# Patient Record
Sex: Female | Born: 1950 | Race: White | Hispanic: No | Marital: Single | Smoking: Never smoker
Health system: Southern US, Community
[De-identification: ages and names within clinical notes are randomized; demographics above are authoritative.]

## PROBLEM LIST (undated history)

## (undated) ENCOUNTER — Ambulatory Visit: Source: Home / Self Care

## (undated) DIAGNOSIS — K219 Gastro-esophageal reflux disease without esophagitis: Secondary | ICD-10-CM

## (undated) DIAGNOSIS — F32A Depression, unspecified: Secondary | ICD-10-CM

## (undated) DIAGNOSIS — Z8719 Personal history of other diseases of the digestive system: Secondary | ICD-10-CM

## (undated) DIAGNOSIS — C801 Malignant (primary) neoplasm, unspecified: Secondary | ICD-10-CM

## (undated) DIAGNOSIS — F419 Anxiety disorder, unspecified: Secondary | ICD-10-CM

## (undated) DIAGNOSIS — M5136 Other intervertebral disc degeneration, lumbar region: Secondary | ICD-10-CM

## (undated) DIAGNOSIS — M51369 Other intervertebral disc degeneration, lumbar region without mention of lumbar back pain or lower extremity pain: Secondary | ICD-10-CM

## (undated) DIAGNOSIS — R630 Anorexia: Secondary | ICD-10-CM

## (undated) DIAGNOSIS — E785 Hyperlipidemia, unspecified: Secondary | ICD-10-CM

## (undated) DIAGNOSIS — M5430 Sciatica, unspecified side: Secondary | ICD-10-CM

## (undated) DIAGNOSIS — F329 Major depressive disorder, single episode, unspecified: Secondary | ICD-10-CM

## (undated) DIAGNOSIS — Z8711 Personal history of peptic ulcer disease: Secondary | ICD-10-CM

## (undated) DIAGNOSIS — N200 Calculus of kidney: Secondary | ICD-10-CM

## (undated) DIAGNOSIS — H9319 Tinnitus, unspecified ear: Secondary | ICD-10-CM

## (undated) HISTORY — PX: BACK SURGERY: SHX140

## (undated) HISTORY — DX: Sciatica, unspecified side: M54.30

## (undated) HISTORY — PX: CYST EXCISION: SHX5701

## (undated) HISTORY — PX: CYSTOSCOPY KIDNEY W/ URETERAL GUIDE WIRE: SUR371

## (undated) HISTORY — DX: Hyperlipidemia, unspecified: E78.5

## (undated) HISTORY — DX: Other intervertebral disc degeneration, lumbar region without mention of lumbar back pain or lower extremity pain: M51.369

## (undated) HISTORY — PX: BREAST BIOPSY: SHX20

## (undated) HISTORY — DX: Other intervertebral disc degeneration, lumbar region: M51.36

---
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
## 1974-12-03 HISTORY — PX: TONSILLECTOMY: SUR1361

## 1998-09-28 ENCOUNTER — Other Ambulatory Visit: Admission: RE | Admit: 1998-09-28 | Discharge: 1998-09-28 | Payer: Self-pay | Admitting: *Deleted

## 1999-02-02 ENCOUNTER — Ambulatory Visit (HOSPITAL_COMMUNITY): Admission: RE | Admit: 1999-02-02 | Discharge: 1999-02-02 | Payer: Self-pay | Admitting: Gastroenterology

## 1999-02-24 ENCOUNTER — Encounter: Payer: Self-pay | Admitting: Gastroenterology

## 1999-02-24 ENCOUNTER — Ambulatory Visit (HOSPITAL_COMMUNITY): Admission: RE | Admit: 1999-02-24 | Discharge: 1999-02-24 | Payer: Self-pay | Admitting: Gastroenterology

## 1999-09-20 ENCOUNTER — Other Ambulatory Visit: Admission: RE | Admit: 1999-09-20 | Discharge: 1999-09-20 | Payer: Self-pay | Admitting: *Deleted

## 2000-09-17 ENCOUNTER — Other Ambulatory Visit: Admission: RE | Admit: 2000-09-17 | Discharge: 2000-09-17 | Payer: Self-pay | Admitting: *Deleted

## 2001-12-03 HISTORY — PX: ROTATOR CUFF REPAIR: SHX139

## 2001-12-03 HISTORY — PX: ELBOW SURGERY: SHX618

## 2002-09-15 ENCOUNTER — Other Ambulatory Visit: Admission: RE | Admit: 2002-09-15 | Discharge: 2002-09-15 | Payer: Self-pay | Admitting: Obstetrics and Gynecology

## 2003-03-02 ENCOUNTER — Ambulatory Visit (HOSPITAL_BASED_OUTPATIENT_CLINIC_OR_DEPARTMENT_OTHER): Admission: RE | Admit: 2003-03-02 | Discharge: 2003-03-02 | Payer: Self-pay | Admitting: Orthopedic Surgery

## 2003-10-19 ENCOUNTER — Ambulatory Visit (HOSPITAL_COMMUNITY): Admission: RE | Admit: 2003-10-19 | Discharge: 2003-10-19 | Payer: Self-pay | Admitting: Gastroenterology

## 2003-11-16 ENCOUNTER — Encounter (INDEPENDENT_AMBULATORY_CARE_PROVIDER_SITE_OTHER): Payer: Self-pay | Admitting: Gastroenterology

## 2003-12-09 ENCOUNTER — Other Ambulatory Visit: Admission: RE | Admit: 2003-12-09 | Discharge: 2003-12-09 | Payer: Self-pay | Admitting: Obstetrics and Gynecology

## 2003-12-22 ENCOUNTER — Encounter: Admission: RE | Admit: 2003-12-22 | Discharge: 2003-12-22 | Payer: Self-pay | Admitting: Family Medicine

## 2004-01-17 ENCOUNTER — Ambulatory Visit (HOSPITAL_BASED_OUTPATIENT_CLINIC_OR_DEPARTMENT_OTHER): Admission: RE | Admit: 2004-01-17 | Discharge: 2004-01-17 | Payer: Self-pay | Admitting: Urology

## 2004-04-28 ENCOUNTER — Encounter: Admission: RE | Admit: 2004-04-28 | Discharge: 2004-04-28 | Payer: Self-pay | Admitting: Family Medicine

## 2004-12-14 ENCOUNTER — Other Ambulatory Visit: Admission: RE | Admit: 2004-12-14 | Discharge: 2004-12-14 | Payer: Self-pay | Admitting: Obstetrics and Gynecology

## 2005-02-02 ENCOUNTER — Ambulatory Visit (HOSPITAL_BASED_OUTPATIENT_CLINIC_OR_DEPARTMENT_OTHER): Admission: RE | Admit: 2005-02-02 | Discharge: 2005-02-02 | Payer: Self-pay | Admitting: Urology

## 2005-12-18 ENCOUNTER — Other Ambulatory Visit: Admission: RE | Admit: 2005-12-18 | Discharge: 2005-12-18 | Payer: Self-pay | Admitting: Obstetrics and Gynecology

## 2007-04-02 ENCOUNTER — Ambulatory Visit: Payer: Self-pay | Admitting: Gastroenterology

## 2007-11-10 ENCOUNTER — Encounter: Admission: RE | Admit: 2007-11-10 | Discharge: 2007-11-10 | Payer: Self-pay | Admitting: Endocrinology

## 2007-12-02 ENCOUNTER — Encounter (INDEPENDENT_AMBULATORY_CARE_PROVIDER_SITE_OTHER): Payer: Self-pay | Admitting: Interventional Radiology

## 2007-12-02 ENCOUNTER — Other Ambulatory Visit: Admission: RE | Admit: 2007-12-02 | Discharge: 2007-12-02 | Payer: Self-pay | Admitting: Interventional Radiology

## 2007-12-02 ENCOUNTER — Encounter: Admission: RE | Admit: 2007-12-02 | Discharge: 2007-12-02 | Payer: Self-pay | Admitting: Endocrinology

## 2009-02-28 ENCOUNTER — Telehealth: Payer: Self-pay | Admitting: Internal Medicine

## 2009-03-01 DIAGNOSIS — F341 Dysthymic disorder: Secondary | ICD-10-CM

## 2009-03-01 DIAGNOSIS — I10 Essential (primary) hypertension: Secondary | ICD-10-CM | POA: Insufficient documentation

## 2009-03-01 DIAGNOSIS — D126 Benign neoplasm of colon, unspecified: Secondary | ICD-10-CM

## 2009-03-01 DIAGNOSIS — K209 Esophagitis, unspecified without bleeding: Secondary | ICD-10-CM | POA: Insufficient documentation

## 2009-03-01 DIAGNOSIS — E119 Type 2 diabetes mellitus without complications: Secondary | ICD-10-CM

## 2009-03-01 DIAGNOSIS — E7800 Pure hypercholesterolemia, unspecified: Secondary | ICD-10-CM

## 2009-03-01 DIAGNOSIS — N200 Calculus of kidney: Secondary | ICD-10-CM

## 2009-03-01 DIAGNOSIS — K2940 Chronic atrophic gastritis without bleeding: Secondary | ICD-10-CM | POA: Insufficient documentation

## 2009-03-02 ENCOUNTER — Ambulatory Visit: Payer: Self-pay | Admitting: Gastroenterology

## 2009-03-02 ENCOUNTER — Telehealth: Payer: Self-pay | Admitting: Nurse Practitioner

## 2009-03-02 DIAGNOSIS — R1013 Epigastric pain: Secondary | ICD-10-CM

## 2009-03-02 DIAGNOSIS — R109 Unspecified abdominal pain: Secondary | ICD-10-CM

## 2009-03-02 DIAGNOSIS — R143 Flatulence: Secondary | ICD-10-CM

## 2009-03-02 DIAGNOSIS — R141 Gas pain: Secondary | ICD-10-CM

## 2009-03-02 DIAGNOSIS — R142 Eructation: Secondary | ICD-10-CM

## 2009-03-02 DIAGNOSIS — K5900 Constipation, unspecified: Secondary | ICD-10-CM | POA: Insufficient documentation

## 2009-03-21 ENCOUNTER — Ambulatory Visit: Payer: Self-pay | Admitting: Internal Medicine

## 2009-03-21 DIAGNOSIS — K589 Irritable bowel syndrome without diarrhea: Secondary | ICD-10-CM

## 2009-05-10 ENCOUNTER — Encounter (INDEPENDENT_AMBULATORY_CARE_PROVIDER_SITE_OTHER): Payer: Self-pay | Admitting: *Deleted

## 2009-11-18 ENCOUNTER — Encounter: Admission: RE | Admit: 2009-11-18 | Discharge: 2009-11-18 | Payer: Self-pay | Admitting: Family Medicine

## 2010-01-31 ENCOUNTER — Telehealth: Payer: Self-pay | Admitting: Gastroenterology

## 2010-04-10 ENCOUNTER — Encounter: Admission: RE | Admit: 2010-04-10 | Discharge: 2010-04-10 | Payer: Self-pay | Admitting: Family Medicine

## 2010-12-03 HISTORY — PX: EYE SURGERY: SHX253

## 2011-01-02 NOTE — Progress Notes (Signed)
Summary: REV Needed  Phone Note Outgoing Call Call back at [facility] Phone [phone]   Call placed by: Harlow Mares CMA Duncan Dull),  January 31, 2010 11:42 AM Call placed to: Patient Summary of Call: Left message on patients machine to call back. patient needs an office visit with Dr. Arlyce Dice per Ian Bushman office note from April.  Initial call taken by: Harlow Mares CMA Duncan Dull),  January 31, 2010 11:43 AM  Follow-up for Phone Call        Left message on patients machine to call back.  Follow-up by: Harlow Mares CMA Duncan Dull),  February 09, 2010 11:59 AM

## 2011-04-20 NOTE — Op Note (Signed)
[name], [name]                  ACCOUNT NO.:  [id number]   MEDICAL RECORD NO.:  [id number]          PATIENT TYPE:  AMB   LOCATION:  [facility]                         FACILITY:  [facility]   PHYSICIAN:  Mark C. Vernie Ammons, M.D.  DATE OF BIRTH:  August 10, 1951   DATE OF PROCEDURE:  02/02/2005  DATE OF DISCHARGE:                                 OPERATIVE REPORT   PREOPERATIVE DIAGNOSIS:  Right ureteral calculus.   POSTOPERATIVE DIAGNOSIS:  Right ureteral calculus.   PROCEDURE:  Cystoscopy, right retrograde pyelogram with interpretation of  right ureteroscopic stone extraction and double-J stent placement.   SURGEON:  Mark C. Vernie Ammons, M.D.   ANESTHESIA:  General.   SPECIMENS:  Stone.   DRAINS:  A 6 French 26 cm double-J stent in the right ureter with string.   BLOOD LOSS:  None.   COMPLICATIONS:  None.   INDICATIONS:  Patient is a 60 year old white female who was seen yesterday  with right flank pain.  A CT scan revealed a 3 mm stone in the distal  ureter.  She returned today with continued severe pain.  Had been taking  Tylox around the clock.  A KUB revealed a small stone in the distal right  ureter.  I discussed options with her.  She has elected to proceed with  surgical removal of the stone.  I went over the risks and complications as  well as the need for stent.  She understands and elected to proceed.   DESCRIPTION OF PROCEDURE:  After informed consent, the patient went to the  major OR and was placed on the table.  Administered general anesthesia and  was moved to the dorsal lithotomy position.  Her genitalia was thoroughly  prepped and draped.  A 6 French rigid ureteroscope was then passed into the  bladder.  The right orifice was identified.  The scope was passed into the  orifice without difficulty.  Once in the orifice, a retrograde pyelogram was  performed.  A retrograde pyelogram revealed the filling defect in the distal  ureter, consistent with a stone.  I then passed a  0.038 inch floppy-tipped  guidewire up the right ureter under direct fluoroscopic control beyond the  stone.  I then passed the scope over the guidewire to the level of the  stone, identified it, and removed the guidewire.  I then passed the nitinol  basket through the ureteroscope, grasped the stone and extracted it  completely.  The ureteroscope was then reinserted into the right ureter and  with the guidewire passed up into the kidney under direct fluoroscopic  control.  I then removed the ureteroscope and back-loaded the cystoscope,  passed a double-J stent over the guidewire into the renal pelvis with good  control being noted there and in the bladder.  The string was left affixed  to the stent, and the bladder was drained, the cystoscope removed, and the  patient was awakened and taken to the recovery room in stable, satisfactory  condition.  She tolerated the procedure well.  No intraoperative  complications.   She has Tylox already  and will use that for pain and then follow up in my  office in approximately five days for her stent removal.      MCO/MEDQ  D:  02/02/2005  T:  02/02/2005  Job:  [id number]

## 2011-04-20 NOTE — Op Note (Signed)
Alice [name], Alice [name]                              ACCOUNT NO.:  [id number]   MEDICAL RECORD NO.:  [id number]                   PATIENT TYPE:  AMB   LOCATION:  [facility]                                  FACILITY:  [facility]   PHYSICIAN:  Robert A. Thurston Hole, M.D.              DATE OF BIRTH:  1951/03/12   DATE OF PROCEDURE:  03/02/2003  DATE OF DISCHARGE:                                 OPERATIVE REPORT   PREOPERATIVE DIAGNOSES:  1. Left shoulder rotator cuff tendinitis with impingement.  2. Left elbow lateral epicondylitis with partial tear.   POSTOPERATIVE DIAGNOSES:  1. Left shoulder rotator cuff tendinitis with impingement.  2. Left elbow lateral epicondylitis with partial tear.   PROCEDURE:  1. Left shoulder examination under anesthesia followed by arthroscopic     subacromial decompression.  2. Left elbow lateral epicondylar release with partial lateral     epicondylectomy and repair.   SURGEON:  Elana Alm. Thurston Hole, M.D.   ASSISTANT:  Julien Girt, P.A.   ANESTHESIA:  General.   OPERATIVE TIME:  One hour.   COMPLICATIONS:  None.   INDICATION FOR PROCEDURE:  The patient is a 60 year old woman who has had  over a year of left shoulder and elbow pain, with exam and MRI documenting  rotator cuff tendinitis and impingement, with left elbow lateral  epicondylitis with partial tearing.  She has failed conservative care and is  now to undergo left shoulder arthroscopy and left elbow lateral epicondylar  release.   DESCRIPTION OF PROCEDURE:  The patient was brought to the operating room on  March 02, 2003 after an interscalen block had been placed in the holding  room by anesthesia.  She was placed on the operative table in a supine  position.  After an adequate level of general anesthesia was obtained, her  left shoulder and elbow were examined under anesthesia.  She had full range  of motion and both the shoulder and elbow were stable to ligamentous exam.  She was then placed in a  beach chair position and the shoulder and arm were  prepped using sterile Duraprep and draped using sterile technique.  Initially, the shoulder arthroscopy was performed.  Through a posterior  arthroscopic portal, the arthroscope with a pump attachment was placed and  through an anterior portal, an arthroscopic probe was placed.  On initial  inspection, the articular cartilage and the glenohumeral joint were intact.  The anterior labrum showed partial tearing of 25%, which was debrided;  superior labrum, partial fraying and tearing to 15-20%, which was debrided.  The biceps tendon anchor and biceps tendon were intact.  The posterior  labrum was intact.  The inferior labrum and anteroinferior glenohumeral  ligament complex were intact.  The rotator cuff was thoroughly inspected and  there was found to be no evidence of a tear.  Inferior capsule recess was  free of pathology.  After  this was done, the lateral arthroscopic portal was  made and the subacromial space was entered.  Moderately thickened bursitis  was resected.  The rotator cuff underneath this was moderately frayed, but  no evidence of a tear.  Subacromial decompression was carried out, removing  6 mm of the undersurface of the anterior, anterolateral and anteromedial  acromion and CA ligament release carried out as well.  The [facility] joint was not  disturbed.  After this was done, the shoulder could be brought through a  full range of motion with no impingement on the rotator cuff.  At this  point, the arthroscopic instruments were removed.  Portals were closed with  3-0 nylon sutures.  After this was done, attention was turned to the left  elbow.  A sterile tourniquet was placed and the arm was exsanguinated and  tourniquet elevated to 275 mm.  Initially, through a 3-cm lateral incision  based over the lateral epicondyle, exposure was made.  Underlying  subcutaneous tissues were incised in line with the skin incision.  The  fascia  over the lateral epicondyle was incised and the underlying ECRB and  ECRL tendons were exposed.  Careful and sharp dissection was used to strip  the ECRB and ECRL tendons off the lateral epicondyle, however, the radial  head capitellar joint was not entered.  The tendons were found to have  moderate degeneration with partial tearing and this was thoroughly debrided  back to healthy-appearing tissue.  After this was done, a partial lateral  epicondylectomy was carried out and then multiple drill holes were placed in  the lateral epicondyle and then using 2-0 Fibrewire suture, the ECRB and  ECRL tendons were reattached to the lateral epicondyle through two drill  holes in the lateral epicondyle and tied down over bone.  After this was  done, there was found to be a satisfactory repair.  At this point, the wound  was irrigated and then the fascia closed with 2-0 Vicryl.  Subcutaneous  tissues were closed with 3-0 Vicryl, subcuticular layer closed with 3-0  Prolene, and Steri-Strips were applied, sterile dressings and a long arm  splint applied, tourniquet was released, patient then awakened and taken to  the recovery room in stable condition.  Needle and sponge count was correct  x2 at the end of the case.   FOLLOWUP CARE:  The patient will be followed as an outpatient on Percocet  and Celebrex.  I will see her back in the office in a week for sutures out  and followup.                                               Robert A. Thurston Hole, M.D.    RAW/MEDQ  D:  03/02/2003  T:  03/02/2003  Job:  [id number]

## 2011-04-20 NOTE — Assessment & Plan Note (Signed)
[facility]                         GASTROENTEROLOGY OFFICE NOTE   Alice [name], Alice [name]                         MRN:          [id number]  DATE:04/02/2007                            DOB:          14-Oct-1951    Diane comes in and says she is doing fairly well.  Still has burning in  her stomach.  She sees Dr. Leonides Sake.  Says she is having some left  and right lower quadrant pain and bloating.  She went to [employer]  and lost some weight.  She has some fissures in the past, but has been  doing well.  Has symptoms now of alkaline gastritis.   FAMILY HISTORY:  Reveals her mother had colon polyps.   PAST MEDICAL HISTORY:  1. Hypercholesterolemia.  2. Anxiety and depression.  3. Renal stones.  4. Status post appendectomy.   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   REVIEW OF SYSTEMS:  Basically noncontributory, except for some blood in  her urine, increased urination and some night sweats.   PHYSICAL EXAMINATION:  GENERAL:  She looks great. Looks as she always  does.  VITAL SIGNS:  Height 5 feet, weight 129, blood pressure 110/68, pulse 68  and regular.  Neck, heart, lungs are unremarkable.   IMPRESSION:  1. Irritable bowel with constipation complaint.  2. Symptoms of alkaline gastritis.  3. Anxiety and depression.  4. Status post appendectomy.  5. Hyperlipidemia.  6. History of renal stones.   RECOMMENDATIONS:  Take Carafate suspension one t.i.d. between meals.  Senokot with senna for constipation and prunes.  I have put her on some  Zegerid.  I told her that she did not need her colonoscopy until next  year, and she is being referred to Dr. Iva Boop.   She has agreed with this.     Ulyess Mort, MD  Electronically Signed    SML/MedQ  DD: 04/02/2007  DT: 04/03/2007  Job #: [id number]   cc:   Holley Bouche, M.D.

## 2011-04-20 NOTE — Op Note (Signed)
NAME:  Alice [name], Alice [name]                            ACCOUNT NO.:  [id number]   MEDICAL RECORD NO.:  [id number]                   PATIENT TYPE:  AMB   LOCATION:  [facility]                                 FACILITY:  [facility]   PHYSICIAN:  Mark C. Vernie Ammons, M.D.               DATE OF BIRTH:  04-25-1951   DATE OF PROCEDURE:  01/17/2004  DATE OF DISCHARGE:                                 OPERATIVE REPORT   PREOPERATIVE DIAGNOSES:  Bilateral renal calculi and back pain.   POSTOPERATIVE DIAGNOSES:  Bilateral renal calculi and back pain.   PROCEDURE:  Cystoscopy, bilateral retrograde pyelograms with interpretation  and examination under anesthesia.   SURGEON:  Mark C. Vernie Ammons, M.D.   ANESTHESIA:  General.   BLOOD LOSS:  None.   DRAINS:  None.   SPECIMENS:  None.   COMPLICATIONS:  None.   INDICATIONS:  The patient is a 60 year old white female who was seen for  bilateral renal calculi and hematuria.  The stones were found on recent  workup for abdominal pain on a CT scan, they appeared to be peripherally  located.  She began having discomfort in the suprapubic region and pelvic  region back in mid October constant aching-type that then moved into the  left groin region.  She has never passed a stone and has not seen any  hematuria.  I cultured the urine to be sure she had no infection and that  was negative.  Because of that, I felt cystoscopy and retrogrades were  indicated to rule out ureteral pathology and evaluate the bladder  completely.  She, therefore, was brought to the OR today for cystoscopy and  retrograde pyelograms.   DESCRIPTION OF OPERATION:  After informed consent, the patient brought to  the major OR, placed on the table, administered general anesthesia and moved  to the dorsal lithotomy position.  Her genitalia were sterilely prepped and  draped and a 23 French cystoscope was then introduced into the bladder with  the obturator, the obturator was removed and the bladder  drained.  I  inserted a 12 degree lens to fully inspect the bladder and it was noted to  be lined with normal-appearing pink mucosa and free of any tumor, stones or  inflammatory lesions.  The ureteral orifices were normal in configuration  and position with clear efflux bilaterally.   An open-ended 6 French ureteral catheter was then placed into the left  ureteral orifice and a retrograde pyelogram was performed.  She was noted to  have an entirely normal ureter with normal caliber, no dilatation, no  intraluminal filling defects or external compression, no hydronephrosis and  a normal-appearing renal pelvis and caliceal system with sharp calices.  No  filling defects were noted.  I then watched the contrast pass down the  ureter fluoroscopically unobstructed and into the bladder.  A right  retrograde pyelogram was then performed in an  identical fashion with  identical findings, primarily no hydronephrosis, filling defects, mass  effect, stones or other abnormality.  I, therefore, drained the bladder and  removed the cystoscope.   With the patient under anesthesia, bimanual examination was performed.  She  was noted to have a normal uterus and cervix to palpation, no adnexal masses  or other pelvis masses palpable.  Rectal examination was performed and no  masses were felt or other abnormality.   At this point, she does have bilateral renal calculi which are likely  causing microscopic hematuria and will need followup of these to assure  their stability; however, it is my opinion that her pain is nonurologic in  origin.  She has already had colonoscopy and endoscopy and has seen a  gynecologist, her pain may be musculoskeletal in origin but does not appear  to be urologically related.  She will, therefore, follow up with me for her  kidney stones and 6 months for a KUB.                                               Mark C. Vernie Ammons, M.D.    MCO/MEDQ  D:  01/17/2004  T:  01/17/2004   Job:  [id number]   cc:   Holley Bouche, M.D.  [address]  [place], [place] [address]  Fax: [phone]

## 2011-10-02 ENCOUNTER — Other Ambulatory Visit: Payer: Self-pay | Admitting: Ophthalmology

## 2011-11-21 ENCOUNTER — Encounter: Payer: Self-pay | Admitting: Gastroenterology

## 2012-05-16 ENCOUNTER — Other Ambulatory Visit: Payer: Self-pay | Admitting: Family Medicine

## 2012-05-16 DIAGNOSIS — R1030 Lower abdominal pain, unspecified: Secondary | ICD-10-CM

## 2012-05-23 ENCOUNTER — Ambulatory Visit
Admission: RE | Admit: 2012-05-23 | Discharge: 2012-05-23 | Disposition: A | Discharge status: Home / Self Care | Payer: BC Managed Care – PPO | Source: Ambulatory Visit | Attending: Family Medicine | Admitting: Family Medicine

## 2012-05-23 DIAGNOSIS — R1030 Lower abdominal pain, unspecified: Secondary | ICD-10-CM

## 2012-05-28 ENCOUNTER — Other Ambulatory Visit: Payer: Self-pay | Admitting: Family Medicine

## 2012-05-28 DIAGNOSIS — K869 Disease of pancreas, unspecified: Secondary | ICD-10-CM

## 2012-05-28 DIAGNOSIS — N949 Unspecified condition associated with female genital organs and menstrual cycle: Secondary | ICD-10-CM

## 2012-06-03 ENCOUNTER — Other Ambulatory Visit: Payer: BC Managed Care – PPO

## 2012-06-03 ENCOUNTER — Ambulatory Visit
Admission: RE | Admit: 2012-06-03 | Discharge: 2012-06-03 | Disposition: A | Discharge status: Home / Self Care | Payer: BC Managed Care – PPO | Source: Ambulatory Visit | Attending: Family Medicine | Admitting: Family Medicine

## 2012-06-03 DIAGNOSIS — N949 Unspecified condition associated with female genital organs and menstrual cycle: Secondary | ICD-10-CM

## 2012-06-03 DIAGNOSIS — K869 Disease of pancreas, unspecified: Secondary | ICD-10-CM

## 2012-06-03 MED ORDER — GADOBENATE DIMEGLUMINE 529 MG/ML IV SOLN
12.0000 mL | Freq: Once | INTRAVENOUS | Status: AC | PRN
  Administered 2012-06-03: 12 mL via INTRAVENOUS

## 2012-06-07 ENCOUNTER — Ambulatory Visit
Admission: RE | Admit: 2012-06-07 | Discharge: 2012-06-07 | Disposition: A | Discharge status: Home / Self Care | Payer: BC Managed Care – PPO | Source: Ambulatory Visit | Attending: Family Medicine | Admitting: Family Medicine

## 2012-06-07 DIAGNOSIS — K869 Disease of pancreas, unspecified: Secondary | ICD-10-CM

## 2012-06-07 DIAGNOSIS — N949 Unspecified condition associated with female genital organs and menstrual cycle: Secondary | ICD-10-CM

## 2012-06-07 MED ORDER — GADOBENATE DIMEGLUMINE 529 MG/ML IV SOLN
14.0000 mL | Freq: Once | INTRAVENOUS | Status: AC | PRN
  Administered 2012-06-07: 14 mL via INTRAVENOUS

## 2012-07-22 ENCOUNTER — Other Ambulatory Visit: Payer: Self-pay | Admitting: Radiology

## 2012-08-11 ENCOUNTER — Encounter (INDEPENDENT_AMBULATORY_CARE_PROVIDER_SITE_OTHER): Payer: Self-pay | Admitting: Surgery

## 2012-08-11 ENCOUNTER — Ambulatory Visit (INDEPENDENT_AMBULATORY_CARE_PROVIDER_SITE_OTHER): Payer: BC Managed Care – PPO | Admitting: Surgery

## 2012-08-11 VITALS — BP 154/82 | HR 67 | Temp 97.4°F | Resp 18 | Ht 65.0 in | Wt 139.4 lb

## 2012-08-11 DIAGNOSIS — N6099 Unspecified benign mammary dysplasia of unspecified breast: Secondary | ICD-10-CM

## 2012-08-11 DIAGNOSIS — N62 Hypertrophy of breast: Secondary | ICD-10-CM

## 2012-08-11 NOTE — Progress Notes (Signed)
Patient ID: Alice [name], female   DOB: 11/06/1951, 61 y.o.   MRN: [id number]  Chief Complaint  Patient presents with  . Pre-op Exam    eval Rt br    HPI Alice [name] is a 61 y.o. female.   HPIshe is referred by Dr. Johny Blamer and Dr. Yolanda Bonine for evaluation of atypical cells in the right breast found on recent mammography and biopsy.  She has had no previous problems regarding her breast. She denies nipple discharge. She has no complaints.  Past Medical History  Diagnosis Date  . Hyperlipidemia     Past Surgical History  Procedure Date  . Appendectomy 1959  . Tonsillectomy 1976  . Shoulder surgery 2003    left  . Elbow surgery 2003    left  . Eye surgery 2012    right    Family History  Problem Relation Age of Onset  . Cancer Mother     breast    Social History History  Substance Use Topics  . Smoking status: Never Smoker   . Smokeless tobacco: Not on file  . Alcohol Use: No    Allergies  Allergen Reactions  . Penicillins     Upset stomach  . Propoxyphene Hcl     Current Outpatient Prescriptions  Medication Sig Dispense Refill  . chlordiazePOXIDE (LIBRIUM) 10 MG capsule Take 10 mg by mouth 3 (three) times daily as needed.      . citalopram (CELEXA) 10 MG tablet Take 10 mg by mouth daily.      . clonazePAM (KLONOPIN) 0.5 MG tablet Take 0.5 mg by mouth at bedtime.      Marland Kitchen estradiol-norethindrone (ACTIVELLA) 1-0.5 MG per tablet Take 1 tablet by mouth daily.      . pravastatin (PRAVACHOL) 10 MG tablet Take 10 mg by mouth daily.        Review of Systems Review of Systems  Constitutional: Negative for fever, chills and unexpected weight change.  HENT: Negative for hearing loss, congestion, sore throat, trouble swallowing and voice change.   Eyes: Negative for visual disturbance.  Respiratory: Negative for cough and wheezing.   Cardiovascular: Negative for chest pain, palpitations and leg swelling.  Gastrointestinal: Negative for nausea, vomiting,  abdominal pain, diarrhea, constipation, blood in stool, abdominal distention and anal bleeding.  Genitourinary: Negative for hematuria, vaginal bleeding and difficulty urinating.  Musculoskeletal: Negative for arthralgias.  Skin: Negative for rash and wound.  Neurological: Negative for seizures, syncope and headaches.  Hematological: Negative for adenopathy. Does not bruise/bleed easily.  Psychiatric/Behavioral: Negative for confusion.    Blood pressure 154/82, pulse 67, temperature 97.4 F (36.3 C), temperature source Temporal, resp. rate 18, height 5\' 5"  (1.651 m), weight 139 lb 6.4 oz (63.231 kg), SpO2 98.00%.  Physical Exam Physical Exam  Constitutional: She is oriented to person, place, and time. She appears well-developed and well-nourished. No distress.  HENT:  Head: Normocephalic and atraumatic.  Right Ear: External ear normal.  Left Ear: External ear normal.  Nose: Nose normal.  Mouth/Throat: Oropharynx is clear and moist.  Eyes: Conjunctivae are normal. Pupils are equal, round, and reactive to light. Right eye exhibits no discharge. Left eye exhibits no discharge. No scleral icterus.  Neck: Normal range of motion. Neck supple. No tracheal deviation present. No thyromegaly present.  Cardiovascular: Normal rate, regular rhythm, normal heart sounds and intact distal pulses.   No murmur heard. Pulmonary/Chest: Effort normal and breath sounds normal. No respiratory distress. She has no wheezes. She has no  rales.  Abdominal: Soft. Bowel sounds are normal. She exhibits no distension.  Musculoskeletal: Normal range of motion. She exhibits no edema and no tenderness.  Lymphadenopathy:    She has no cervical adenopathy.  Neurological: She is alert and oriented to person, place, and time.  Skin: Skin is warm and dry. No rash noted. She is not diaphoretic. No erythema.  Psychiatric: Her behavior is normal. Judgment and thought content normal.    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 I discussed this with her in detail. I discussed the risk of surgery which includes but is not limited to bleeding, infection, injury to shrink structures, need for further surgery should malignancy be present, et Karie Soda. She understands and wishes to proceed. Surgery will be scheduled. Likelihood of success is good       Kacey Dysert A 08/11/2012, 1:48 PM

## 2012-08-20 ENCOUNTER — Telehealth: Payer: Self-pay | Admitting: Gastroenterology

## 2012-08-20 NOTE — Telephone Encounter (Signed)
Left message for pt to call back  °

## 2012-08-22 NOTE — Telephone Encounter (Signed)
Pt c/o abdominal pain and bloating. Pt requesting to be seen by Dr. Arlyce Dice. Pt scheduled to see Dr. Arlyce Dice 09/22/12. Pt aware of appt date and time.

## 2012-09-01 ENCOUNTER — Encounter (HOSPITAL_BASED_OUTPATIENT_CLINIC_OR_DEPARTMENT_OTHER): Payer: Self-pay | Admitting: *Deleted

## 2012-09-01 NOTE — Progress Notes (Signed)
No labs needed

## 2012-09-03 NOTE — H&P (Signed)
Patient ID: Alice [name], female DOB: [birth year], 61 y.o. MRN: [id number]  Chief Complaint   Patient presents with   .  Pre-op Exam     eval Rt br    HPI  Alice [name] is a 61 y.o. female.  HPIshe is referred by Dr. Johny Blamer and Dr. Yolanda Bonine for evaluation of atypical cells in the right breast found on recent mammography and biopsy. She has had no previous problems regarding her breast. She denies nipple discharge. She has no complaints.  Past Medical History   Diagnosis  Date   .  Hyperlipidemia     Past Surgical History   Procedure  Date   .  Appendectomy  1959   .  Tonsillectomy  1976   .  Shoulder surgery  2003     left   .  Elbow surgery  2003     left   .  Eye surgery  2012     right    Family History   Problem  Relation  Age of Onset   .  Cancer  Mother       breast    Social History  History   Substance Use Topics   .  Smoking status:  Never Smoker   .  Smokeless tobacco:  Not on file   .  Alcohol Use:  No    Allergies   Allergen  Reactions   .  Penicillins      Upset stomach   .  Propoxyphene Hcl     Current Outpatient Prescriptions   Medication  Sig  Dispense  Refill   .  chlordiazePOXIDE (LIBRIUM) 10 MG capsule  Take 10 mg by mouth 3 (three) times daily as needed.     .  citalopram (CELEXA) 10 MG tablet  Take 10 mg by mouth daily.     .  clonazePAM (KLONOPIN) 0.5 MG tablet  Take 0.5 mg by mouth at bedtime.     Marland Kitchen  estradiol-norethindrone (ACTIVELLA) 1-0.5 MG per tablet  Take 1 tablet by mouth daily.     .  pravastatin (PRAVACHOL) 10 MG tablet  Take 10 mg by mouth daily.      Review of Systems  Review of Systems  Constitutional: Negative for fever, chills and unexpected weight change.  HENT: Negative for hearing loss, congestion, sore throat, trouble swallowing and voice change.  Eyes: Negative for visual disturbance.  Respiratory: Negative for cough and wheezing.  Cardiovascular: Negative for chest pain, palpitations and leg swelling.    Gastrointestinal: Negative for nausea, vomiting, abdominal pain, diarrhea, constipation, blood in stool, abdominal distention and anal bleeding.  Genitourinary: Negative for hematuria, vaginal bleeding and difficulty urinating.  Musculoskeletal: Negative for arthralgias.  Skin: Negative for rash and wound.  Neurological: Negative for seizures, syncope and headaches.  Hematological: Negative for adenopathy. Does not bruise/bleed easily.  Psychiatric/Behavioral: Negative for confusion.   Blood pressure 154/82, pulse 67, temperature 97.4 F (36.3 C), temperature source Temporal, resp. rate 18, height 5\' 5"  (1.651 m), weight 139 lb 6.4 oz (63.231 kg), SpO2 98.00%.  Physical Exam  Physical Exam  Constitutional: She is oriented to person, place, and time. She appears well-developed and well-nourished. No distress.  HENT:  Head: Normocephalic and atraumatic.  Right Ear: External ear normal.  Left Ear: External ear normal.  Nose: Nose normal.  Mouth/Throat: Oropharynx is clear and moist.  Eyes: Conjunctivae are normal. Pupils are equal, round, and reactive to light. Right eye exhibits no discharge. Left  eye exhibits no discharge. No scleral icterus.  Neck: Normal range of motion. Neck supple. No tracheal deviation present. No thyromegaly present.  Cardiovascular: Normal rate, regular rhythm, normal heart sounds and intact distal pulses.  No murmur heard.  Pulmonary/Chest: Effort normal and breath sounds normal. No respiratory distress. She has no wheezes. She has no rales.  Abdominal: Soft. Bowel sounds are normal. She exhibits no distension.  Musculoskeletal: Normal range of motion. She exhibits no edema and no tenderness.  Lymphadenopathy:  She has no cervical adenopathy.  Neurological: She is alert and oriented to person, place, and time.  Skin: Skin is warm and dry. No rash noted. She is not diaphoretic. No erythema.  Psychiatric: Her behavior is normal. Judgment and thought content  normal.   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 I discussed this with her in detail. I discussed the risk of surgery which includes but is not limited to bleeding, infection, injury to shrink structures, need for further surgery should malignancy be present, et Karie Soda. She understands and wishes to proceed. Surgery will be scheduled. Likelihood of success is good   Tijuana Scheidegger A

## 2012-09-04 ENCOUNTER — Ambulatory Visit (HOSPITAL_BASED_OUTPATIENT_CLINIC_OR_DEPARTMENT_OTHER)
Admission: RE | Admit: 2012-09-04 | Discharge: 2012-09-04 | Disposition: A | Discharge status: Home / Self Care | Payer: BC Managed Care – PPO | Source: Ambulatory Visit | Attending: Surgery | Admitting: Surgery

## 2012-09-04 ENCOUNTER — Encounter (HOSPITAL_BASED_OUTPATIENT_CLINIC_OR_DEPARTMENT_OTHER)
Admission: RE | Disposition: A | Discharge status: Home / Self Care | Payer: Self-pay | Source: Ambulatory Visit | Attending: Surgery

## 2012-09-04 ENCOUNTER — Encounter (HOSPITAL_BASED_OUTPATIENT_CLINIC_OR_DEPARTMENT_OTHER): Payer: Self-pay | Admitting: *Deleted

## 2012-09-04 ENCOUNTER — Encounter (HOSPITAL_BASED_OUTPATIENT_CLINIC_OR_DEPARTMENT_OTHER): Payer: Self-pay | Admitting: Anesthesiology

## 2012-09-04 ENCOUNTER — Ambulatory Visit (HOSPITAL_BASED_OUTPATIENT_CLINIC_OR_DEPARTMENT_OTHER): Payer: BC Managed Care – PPO | Admitting: Anesthesiology

## 2012-09-04 DIAGNOSIS — N6019 Diffuse cystic mastopathy of unspecified breast: Secondary | ICD-10-CM

## 2012-09-04 DIAGNOSIS — N6089 Other benign mammary dysplasias of unspecified breast: Secondary | ICD-10-CM

## 2012-09-04 DIAGNOSIS — N6099 Unspecified benign mammary dysplasia of unspecified breast: Secondary | ICD-10-CM

## 2012-09-04 HISTORY — DX: Personal history of other diseases of the digestive system: Z87.19

## 2012-09-04 HISTORY — DX: Major depressive disorder, single episode, unspecified: F32.9

## 2012-09-04 HISTORY — DX: Depression, unspecified: F32.A

## 2012-09-04 HISTORY — DX: Anxiety disorder, unspecified: F41.9

## 2012-09-04 LAB — POCT HEMOGLOBIN-HEMACUE: Hemoglobin: 14.6 g/dL (ref 12.0–15.0)

## 2012-09-04 SURGERY — BREAST LUMPECTOMY WITH NEEDLE LOCALIZATION
Anesthesia: General | Site: Breast | Laterality: Right | Wound class: Clean

## 2012-09-04 MED ORDER — MIDAZOLAM HCL 2 MG/2ML IJ SOLN: 0.5000 mg | INTRAMUSCULAR | Status: DC | PRN

## 2012-09-04 MED ORDER — LACTATED RINGERS IV SOLN
INTRAVENOUS | Status: DC
  Administered 2012-09-04: 14:00:00 via INTRAVENOUS

## 2012-09-04 MED ORDER — ONDANSETRON HCL 4 MG/2ML IJ SOLN: 4.0000 mg | Freq: Four times a day (QID) | INTRAMUSCULAR | Status: DC | PRN

## 2012-09-04 MED ORDER — OXYCODONE HCL 5 MG/5ML PO SOLN: 5.0000 mg | Freq: Once | ORAL | Status: DC | PRN

## 2012-09-04 MED ORDER — ONDANSETRON HCL 4 MG/2ML IJ SOLN
INTRAMUSCULAR | Status: DC | PRN
  Administered 2012-09-04: 4 mg via INTRAVENOUS

## 2012-09-04 MED ORDER — ACETAMINOPHEN 325 MG PO TABS: 650.0000 mg | ORAL_TABLET | ORAL | Status: DC | PRN

## 2012-09-04 MED ORDER — MIDAZOLAM HCL 5 MG/5ML IJ SOLN
INTRAMUSCULAR | Status: DC | PRN
  Administered 2012-09-04: 2 mg via INTRAVENOUS

## 2012-09-04 MED ORDER — DEXAMETHASONE SODIUM PHOSPHATE 4 MG/ML IJ SOLN
INTRAMUSCULAR | Status: DC | PRN
  Administered 2012-09-04: 10 mg via INTRAVENOUS

## 2012-09-04 MED ORDER — PROPOFOL 10 MG/ML IV BOLUS
INTRAVENOUS | Status: DC | PRN
  Administered 2012-09-04: 200 mg via INTRAVENOUS

## 2012-09-04 MED ORDER — SODIUM CHLORIDE 0.9 % IJ SOLN: 3.0000 mL | Freq: Two times a day (BID) | INTRAMUSCULAR | Status: DC

## 2012-09-04 MED ORDER — MORPHINE SULFATE 2 MG/ML IJ SOLN: 2.0000 mg | INTRAMUSCULAR | Status: DC | PRN

## 2012-09-04 MED ORDER — SODIUM CHLORIDE 0.9 % IJ SOLN: 3.0000 mL | INTRAMUSCULAR | Status: DC | PRN

## 2012-09-04 MED ORDER — HYDROMORPHONE HCL PF 1 MG/ML IJ SOLN: 0.2500 mg | INTRAMUSCULAR | Status: DC | PRN

## 2012-09-04 MED ORDER — ACETAMINOPHEN 10 MG/ML IV SOLN
1000.0000 mg | Freq: Once | INTRAVENOUS | Status: AC
  Administered 2012-09-04: 1000 mg via INTRAVENOUS

## 2012-09-04 MED ORDER — CIPROFLOXACIN IN D5W 400 MG/200ML IV SOLN
400.0000 mg | INTRAVENOUS | Status: AC
  Administered 2012-09-04: 400 mg via INTRAVENOUS

## 2012-09-04 MED ORDER — LIDOCAINE HCL (CARDIAC) 20 MG/ML IV SOLN
INTRAVENOUS | Status: DC | PRN
  Administered 2012-09-04: 60 mg via INTRAVENOUS

## 2012-09-04 MED ORDER — OXYCODONE HCL 5 MG PO TABS: 5.0000 mg | ORAL_TABLET | Freq: Once | ORAL | Status: DC | PRN

## 2012-09-04 MED ORDER — KETOROLAC TROMETHAMINE 30 MG/ML IJ SOLN
INTRAMUSCULAR | Status: DC | PRN
  Administered 2012-09-04: 30 mg via INTRAVENOUS

## 2012-09-04 MED ORDER — FENTANYL CITRATE 0.05 MG/ML IJ SOLN: 50.0000 ug | INTRAMUSCULAR | Status: DC | PRN

## 2012-09-04 MED ORDER — BUPIVACAINE-EPINEPHRINE 0.5% -1:200000 IJ SOLN
INTRAMUSCULAR | Status: DC | PRN
  Administered 2012-09-04: 16 mL

## 2012-09-04 MED ORDER — SODIUM CHLORIDE 0.9 % IV SOLN: 250.0000 mL | INTRAVENOUS | Status: DC | PRN

## 2012-09-04 MED ORDER — HYDROCODONE-ACETAMINOPHEN 5-325 MG PO TABS: 1.0000 | ORAL_TABLET | ORAL | Status: AC | PRN

## 2012-09-04 MED ORDER — FENTANYL CITRATE 0.05 MG/ML IJ SOLN: INTRAMUSCULAR | Status: DC | PRN

## 2012-09-04 MED ORDER — FENTANYL CITRATE 0.05 MG/ML IJ SOLN
INTRAMUSCULAR | Status: DC | PRN
  Administered 2012-09-04: 50 ug via INTRAVENOUS

## 2012-09-04 MED ORDER — OXYCODONE HCL 5 MG PO TABS: 5.0000 mg | ORAL_TABLET | ORAL | Status: DC | PRN

## 2012-09-04 MED ORDER — ACETAMINOPHEN 650 MG RE SUPP: 650.0000 mg | RECTAL | Status: DC | PRN

## 2012-09-04 SURGICAL SUPPLY — 43 items
APL SKNCLS STERI-STRIP NONHPOA (GAUZE/BANDAGES/DRESSINGS) ×1
BENZOIN TINCTURE PRP APPL 2/3 (GAUZE/BANDAGES/DRESSINGS) ×2 IMPLANT
BLADE HEX COATED 2.75 (ELECTRODE) ×2 IMPLANT
BLADE SURG 15 STRL LF DISP TIS (BLADE) ×1 IMPLANT
BLADE SURG 15 STRL SS (BLADE) ×2
CANISTER SUCTION 1200CC (MISCELLANEOUS) IMPLANT
CHLORAPREP W/TINT 26ML (MISCELLANEOUS) ×2 IMPLANT
CLIP TI WIDE RED SMALL 6 (CLIP) IMPLANT
CLOTH BEACON ORANGE TIMEOUT ST (SAFETY) ×2 IMPLANT
COVER MAYO STAND STRL (DRAPES) ×2 IMPLANT
COVER TABLE BACK 60X90 (DRAPES) ×2 IMPLANT
DECANTER SPIKE VIAL GLASS SM (MISCELLANEOUS) IMPLANT
DEVICE DUBIN W/COMP PLATE 8390 (MISCELLANEOUS) ×1 IMPLANT
DRAPE PED LAPAROTOMY (DRAPES) ×2 IMPLANT
DRAPE UTILITY XL STRL (DRAPES) ×2 IMPLANT
DRSG TEGADERM 4X4.75 (GAUZE/BANDAGES/DRESSINGS) ×2 IMPLANT
ELECT REM PT RETURN 9FT ADLT (ELECTROSURGICAL) ×2
ELECTRODE REM PT RTRN 9FT ADLT (ELECTROSURGICAL) ×1 IMPLANT
GAUZE SPONGE 4X4 12PLY STRL LF (GAUZE/BANDAGES/DRESSINGS) ×2 IMPLANT
GLOVE BIO SURGEON STRL SZ7 (GLOVE) ×1 IMPLANT
GLOVE INDICATOR 7.0 STRL GRN (GLOVE) ×1 IMPLANT
GLOVE SURG SIGNA 7.5 PF LTX (GLOVE) ×2 IMPLANT
GOWN PREVENTION PLUS XLARGE (GOWN DISPOSABLE) ×2 IMPLANT
GOWN PREVENTION PLUS XXLARGE (GOWN DISPOSABLE) ×2 IMPLANT
KIT MARKER MARGIN INK (KITS) ×1 IMPLANT
NDL HYPO 25X1 1.5 SAFETY (NEEDLE) ×1 IMPLANT
NEEDLE HYPO 25X1 1.5 SAFETY (NEEDLE) ×2 IMPLANT
NS IRRIG 1000ML POUR BTL (IV SOLUTION) ×1 IMPLANT
PACK BASIN DAY SURGERY FS (CUSTOM PROCEDURE TRAY) ×2 IMPLANT
PENCIL BUTTON HOLSTER BLD 10FT (ELECTRODE) ×2 IMPLANT
SLEEVE SCD COMPRESS KNEE MED (MISCELLANEOUS) ×1 IMPLANT
SPONGE LAP 4X18 X RAY DECT (DISPOSABLE) ×2 IMPLANT
STRIP CLOSURE SKIN 1/2X4 (GAUZE/BANDAGES/DRESSINGS) ×2 IMPLANT
SUT MNCRL AB 4-0 PS2 18 (SUTURE) ×2 IMPLANT
SUT SILK 2 0 SH (SUTURE) ×1 IMPLANT
SUT VIC AB 3-0 SH 27 (SUTURE) ×2
SUT VIC AB 3-0 SH 27X BRD (SUTURE) ×1 IMPLANT
SYR CONTROL 10ML LL (SYRINGE) ×2 IMPLANT
TOWEL OR 17X24 6PK STRL BLUE (TOWEL DISPOSABLE) ×2 IMPLANT
TOWEL OR NON WOVEN STRL DISP B (DISPOSABLE) ×2 IMPLANT
TUBE CONNECTING 20X1/4 (TUBING) IMPLANT
WATER STERILE IRR 1000ML POUR (IV SOLUTION) ×1 IMPLANT
YANKAUER SUCT BULB TIP NO VENT (SUCTIONS) IMPLANT

## 2012-09-04 NOTE — Interval H&P Note (Signed)
History and Physical Interval Note: no change in H and P  09/04/2012 1:10 PM  N Alice [name]  has presented today for surgery, with the diagnosis of atypical cells right breast  The various methods of treatment have been discussed with the patient and family. After consideration of risks, benefits and other options for treatment, the patient has consented to  Procedure(s) (LRB) with comments: BREAST LUMPECTOMY WITH NEEDLE LOCALIZATION (Right) - needle localized right breast lumpectomy as [name] surgical intervention .  The patient's history has been reviewed, patient examined, no change in status, stable for surgery.  I have reviewed the patient's chart and labs.  Questions were answered to the patient's satisfaction.     Alice [name]

## 2012-09-04 NOTE — Op Note (Signed)
BREAST LUMPECTOMY WITH NEEDLE LOCALIZATION  Procedure Note  N Anayi Bricco 09/04/2012   Pre-op Diagnosis: atypical cells right breast     Post-op Diagnosis: same  Procedure(s): NEEDLE LOCALIZED RIGHT BREAST LUMPECTOMY  Surgeon(s): Shelly Rubenstein, MD  Anesthesia: General  Staff:  Nolon Nations Ward, RN - Scrub Person Aleen Sells, RN - Circulator  Estimated Blood Loss: Minimal               Specimens: right breast sent to path  Indications: This is a 61 year old female with atypical cells in the right breast. These were confirmed on stereotactic biopsy. She now presents for needle localized lumpectomy.  Procedure: The patient presented to the holding area having had a localization wire placed in the right breast by the radiologist. She was then taken to the operating room and identified as the correct patient. She was placed on the operating room table and general anesthesia was induced. The skin around the lower edge of the right areola was anesthetized Marcaine. I made a circumareolar incision with electrocautery. I did discuss the breast tissue with the electrocautery. I was then able to identify localization wire and bring it into the incision. I then performed a wide lobectomy going down to the chest wall removing all the tissue around the localization wire. Wide adequate margins appear to be achieved. The specimen was completely removed and x-rayed. The suspicious area was confirmed to be in the center of the specimen with good margins. This was then sent to pathology for evaluation. Hemostasis was achieved with cautery.I I closed the subcuticular tissueWith interrupted 3-0 Vicryl sutures and closed the skin with a running 4-0 Monocryl. Steri-Strips, gauze, and Tegaderm were then applied. The patient tolerated the procedure well. All the counts were correct at the end of the procedure. The patient was then extubated in the operating room and taken in a stable condition to  the recovery room.          Lattie Riege A   Date: 09/04/2012  Time: 2:10 PM

## 2012-09-04 NOTE — Anesthesia Preprocedure Evaluation (Signed)
Anesthesia Evaluation  Patient identified by MRN, date of birth, ID band Patient awake    Reviewed: Allergy & Precautions, H&P , NPO status , Patient's Chart, lab work & pertinent test results  Airway Mallampati: I TM Distance: >3 FB Neck ROM: Full    Dental No notable dental hx. (+) Teeth Intact and Dental Advisory Given   Pulmonary neg pulmonary ROS,  breath sounds clear to auscultation  Pulmonary exam normal       Cardiovascular negative cardio ROS  Rhythm:Regular Rate:Normal     Neuro/Psych negative neurological ROS  negative psych ROS   GI/Hepatic negative GI ROS, Neg liver ROS,   Endo/Other  negative endocrine ROSneg diabetes  Renal/GU Renal diseasenegative Renal ROS  negative genitourinary   Musculoskeletal   Abdominal   Peds  Hematology negative hematology ROS (+)   Anesthesia Other Findings   Reproductive/Obstetrics negative OB ROS                           Anesthesia Physical Anesthesia Plan  ASA: II  Anesthesia Plan: General   Post-op Pain Management:    Induction: Intravenous  Airway Management Planned: LMA  Additional Equipment:   Intra-op Plan:   Post-operative Plan: Extubation in OR  Informed Consent: I have reviewed the patients History and Physical, chart, labs and discussed the procedure including the risks, benefits and alternatives for the proposed anesthesia with the patient or authorized representative who has indicated his/her understanding and acceptance.   Dental advisory given  Plan Discussed with: CRNA  Anesthesia Plan Comments:         Anesthesia Quick Evaluation

## 2012-09-04 NOTE — Anesthesia Procedure Notes (Signed)
Procedure Name: LMA Insertion Performed by: Jericha Bryden W Pre-anesthesia Checklist: Patient identified, Timeout performed, Emergency Drugs available, Suction available and Patient being monitored Patient Re-evaluated:Patient Re-evaluated prior to inductionOxygen Delivery Method: Circle system utilized Preoxygenation: Pre-oxygenation with 100% oxygen Intubation Type: IV induction Ventilation: Mask ventilation without difficulty LMA: LMA inserted LMA Size: 4.0 Number of attempts: 1 Placement Confirmation: breath sounds checked- equal and bilateral and positive ETCO2 Tube secured with: Tape Dental Injury: Teeth and Oropharynx as per pre-operative assessment      

## 2012-09-04 NOTE — Anesthesia Postprocedure Evaluation (Signed)
  Anesthesia Post-op Note  Patient: Alice [name]  Procedure(s) Performed: Procedure(s) (LRB) with comments: BREAST LUMPECTOMY WITH NEEDLE LOCALIZATION (Right) - needle localized right breast lumpectomy  Patient Location: PACU  Anesthesia Type: General  Level of Consciousness: awake, alert  and oriented  Airway and Oxygen Therapy: Patient Spontanous Breathing  Post-op Pain: none  Post-op Assessment: Post-op Vital signs reviewed, Patient's Cardiovascular Status Stable, Respiratory Function Stable, Patent Airway and No signs of Nausea or vomiting  Post-op Vital Signs: Reviewed and stable  Complications: No apparent anesthesia complications

## 2012-09-04 NOTE — Transfer of Care (Signed)
Immediate Anesthesia Transfer of Care Note  Patient: Alice [name]  Procedure(s) Performed: Procedure(s) (LRB) with comments: BREAST LUMPECTOMY WITH NEEDLE LOCALIZATION (Right) - needle localized right breast lumpectomy  Patient Location: PACU  Anesthesia Type: General  Level of Consciousness: awake  Airway & Oxygen Therapy: Patient Spontanous Breathing and Patient connected to face mask oxygen  Post-op Assessment: Report given to PACU RN and Post -op Vital signs reviewed and stable  Post vital signs: Reviewed and stable  Complications: No apparent anesthesia complications

## 2012-09-05 ENCOUNTER — Encounter (INDEPENDENT_AMBULATORY_CARE_PROVIDER_SITE_OTHER): Payer: Self-pay

## 2012-09-09 ENCOUNTER — Encounter (HOSPITAL_BASED_OUTPATIENT_CLINIC_OR_DEPARTMENT_OTHER): Payer: Self-pay

## 2012-09-10 ENCOUNTER — Encounter (INDEPENDENT_AMBULATORY_CARE_PROVIDER_SITE_OTHER): Payer: Self-pay

## 2012-09-17 ENCOUNTER — Encounter (INDEPENDENT_AMBULATORY_CARE_PROVIDER_SITE_OTHER): Payer: Self-pay | Admitting: Surgery

## 2012-09-17 ENCOUNTER — Ambulatory Visit (INDEPENDENT_AMBULATORY_CARE_PROVIDER_SITE_OTHER): Payer: BC Managed Care – PPO | Admitting: Surgery

## 2012-09-17 VITALS — BP 138/84 | HR 78 | Temp 97.4°F | Resp 12 | Ht 65.0 in | Wt 138.4 lb

## 2012-09-17 DIAGNOSIS — Z09 Encounter for follow-up examination after completed treatment for conditions other than malignant neoplasm: Secondary | ICD-10-CM

## 2012-09-17 NOTE — Progress Notes (Signed)
Subjective:     Patient ID: Alice [name], female   DOB: 07/30/1951, 61 y.o.   MRN: [id number]  HPI She is here for her first postop visit status post needle localized right breast lumpectomy. She has no complaints.  Review of Systems     Objective:   Physical Exam On exam, her incision is healing well. Her final pathology showed fibrocystic changes and ductal hyperplasia with no atypia or malignancy identified    Assessment:     Patient stable postop    Plan:     I discussed the pathology with her. She will continue her self examinations and yearly mammograms. I will see her back as needed. If she will also have the lipoma removed from her back she will call me and set up appointment

## 2012-09-22 ENCOUNTER — Encounter: Payer: Self-pay | Admitting: Gastroenterology

## 2012-09-22 ENCOUNTER — Ambulatory Visit (INDEPENDENT_AMBULATORY_CARE_PROVIDER_SITE_OTHER): Payer: BC Managed Care – PPO | Admitting: Gastroenterology

## 2012-09-22 VITALS — BP 100/64 | HR 72 | Ht 65.0 in | Wt 139.8 lb

## 2012-09-22 DIAGNOSIS — Z1211 Encounter for screening for malignant neoplasm of colon: Secondary | ICD-10-CM | POA: Insufficient documentation

## 2012-09-22 DIAGNOSIS — M7918 Myalgia, other site: Secondary | ICD-10-CM | POA: Insufficient documentation

## 2012-09-22 DIAGNOSIS — IMO0001 Reserved for inherently not codable concepts without codable children: Secondary | ICD-10-CM

## 2012-09-22 NOTE — Assessment & Plan Note (Signed)
And screening colonoscopy 2014

## 2012-09-22 NOTE — Assessment & Plan Note (Addendum)
Five-month history of pain that essentially centered in the groin area bilaterally very likely is due to musculoskeletal pain. There are no obvious hernias. I doubt this is due to  visceral pain.  Recommendations #1 no further workup at this point #2 NSAIDs as needed

## 2012-09-22 NOTE — Progress Notes (Signed)
History of Present Illness:   Pleasant 61 year old white female referred at the request of Dr. Tiburcio Pea for evaluation of lower abdominal pain. For the past 5 months she's been complaining of aching bilateral lower abdominal pain, mostly in the groin area, that occur spontaneously. It may worsen after a bowel movement.  It may worsen with prolonged sitting. She underwent ultrasound and MRI which demonstrated a possible small pancreatic cyst on the former, and multiple benign liver cysts. Pancreatic cyst was not verified by MRI. She underwent what sounds like transvaginal ultrasound that, according to the patient, showed some fluid in her right fallopian tube. She denies dysuria or urinary frequency. She complains of occasional excess belching with mild pyrosis.  She denies change of bowel habits, melena or hematochezia. Last colonoscopy was 2004 and was normal..  Pain in her lower abdomen is actually improving.    Past Medical History  Diagnosis Date  . Hyperlipidemia   . H/O hiatal hernia   . Depression   . Anxiety    Past Surgical History  Procedure Date  . Appendectomy 1959  . Tonsillectomy 1976  . Shoulder surgery 2003    left  . Elbow surgery 2003    left  . Eye surgery 2012    right-terigium  . Cystoscopy kidney w/ ureteral guide wire   . Breast biopsy    family history includes Aneurysm in her father; Breast cancer in her mother and paternal grandmother; and Stroke in her father. Current Outpatient Prescriptions  Medication Sig Dispense Refill  . chlordiazePOXIDE (LIBRIUM) 10 MG capsule Take 10 mg by mouth 3 (three) times daily as needed.      . citalopram (CELEXA) 10 MG tablet Take 10 mg by mouth daily.      . clonazePAM (KLONOPIN) 0.5 MG tablet Take 0.5 mg by mouth at bedtime.      Marland Kitchen estradiol-norethindrone (ACTIVELLA) 1-0.5 MG per tablet Take 1 tablet by mouth daily.      . pravastatin (PRAVACHOL) 10 MG tablet Take 10 mg by mouth daily.       Allergies as of 09/22/2012 -  Review Complete 09/22/2012  Allergen Reaction Noted  . Penicillins  08/11/2012  . Propoxyphene hcl      reports that she has never smoked. She has never used smokeless tobacco. She reports that she does not drink alcohol or use illicit drugs.     Review of Systems: Pertinent positive and negative review of systems were noted in the above HPI section. All other review of systems were otherwise negative.  Vital signs were reviewed in today's medical record Physical Exam: General: Well developed , well nourished, no acute distress Head: Normocephalic and atraumatic Eyes:  sclerae anicteric, EOMI Ears: Normal auditory acuity Mouth: No deformity or lesions Neck: Supple, no masses or thyromegaly Lungs: Clear throughout to auscultation Heart: Regular rate and rhythm; no murmurs, rubs or bruits Abdomen: Soft, non tender and non distended. No masses, hepatosplenomegaly or hernias noted. Normal Bowel sounds.  There is no obvious inguinal or femoral hernias Rectal:deferred Musculoskeletal: Symmetrical with no gross deformities  Skin: No lesions on visible extremities Pulses:  Normal pulses noted Extremities: No clubbing, cyanosis, edema or deformities noted Neurological: Alert oriented x 4, grossly nonfocal Cervical Nodes:  No significant cervical adenopathy Inguinal Nodes: No significant inguinal adenopathy Psychological:  Alert and cooperative. Normal mood and affect

## 2012-09-22 NOTE — Patient Instructions (Addendum)
Follow up as needed

## 2012-10-13 ENCOUNTER — Other Ambulatory Visit: Payer: Self-pay | Admitting: Ophthalmology

## 2012-12-09 ENCOUNTER — Encounter (INDEPENDENT_AMBULATORY_CARE_PROVIDER_SITE_OTHER): Payer: Self-pay

## 2013-09-03 ENCOUNTER — Encounter: Payer: Self-pay | Admitting: Gastroenterology

## 2013-11-20 ENCOUNTER — Ambulatory Visit
Admission: RE | Admit: 2013-11-20 | Discharge: 2013-11-20 | Disposition: A | Discharge status: Home / Self Care | Payer: BC Managed Care – PPO | Source: Ambulatory Visit | Attending: Family Medicine | Admitting: Family Medicine

## 2013-11-20 ENCOUNTER — Other Ambulatory Visit: Payer: Self-pay | Admitting: Family Medicine

## 2013-11-20 DIAGNOSIS — M79609 Pain in unspecified limb: Secondary | ICD-10-CM

## 2013-12-03 HISTORY — PX: UPPER GASTROINTESTINAL ENDOSCOPY: SHX188

## 2013-12-03 HISTORY — PX: COLONOSCOPY: SHX174

## 2014-05-01 ENCOUNTER — Encounter: Payer: Self-pay | Admitting: Gastroenterology

## 2014-07-23 ENCOUNTER — Encounter: Payer: Self-pay | Admitting: Gastroenterology

## 2014-07-23 ENCOUNTER — Ambulatory Visit (INDEPENDENT_AMBULATORY_CARE_PROVIDER_SITE_OTHER): Payer: BC Managed Care – PPO | Admitting: Gastroenterology

## 2014-07-23 VITALS — BP 118/64 | HR 84 | Ht 64.5 in | Wt 154.2 lb

## 2014-07-23 DIAGNOSIS — R1310 Dysphagia, unspecified: Secondary | ICD-10-CM

## 2014-07-23 DIAGNOSIS — K219 Gastro-esophageal reflux disease without esophagitis: Secondary | ICD-10-CM | POA: Insufficient documentation

## 2014-07-23 DIAGNOSIS — D126 Benign neoplasm of colon, unspecified: Secondary | ICD-10-CM

## 2014-07-23 MED ORDER — NA SULFATE-K SULFATE-MG SULF 17.5-3.13-1.6 GM/177ML PO SOLN: 1.0000 | Freq: Once | ORAL | Status: DC

## 2014-07-23 MED ORDER — PEG-KCL-NACL-NASULF-NA ASC-C 100 G PO SOLR: 1.0000 | Freq: Once | ORAL | Status: DC

## 2014-07-23 MED ORDER — PANTOPRAZOLE SODIUM 40 MG PO TBEC: 40.0000 mg | DELAYED_RELEASE_TABLET | Freq: Every day | ORAL | Status: DC

## 2014-07-23 NOTE — Addendum Note (Signed)
Addended by: Oda Kilts on: 07/23/2014 02:42 PM   Modules accepted: Orders

## 2014-07-23 NOTE — Assessment & Plan Note (Signed)
Inadequate gastric regurgitation and pyrosis.    Recommendations #1 begin Protonix 40 mg daily

## 2014-07-23 NOTE — Assessment & Plan Note (Signed)
Patient has a remote history of colon polyps.  Last colonoscopy in 2004 was negative.  Recommendations #1 colonoscopy

## 2014-07-23 NOTE — Patient Instructions (Signed)
You have been scheduled for an endoscopy. Please follow written instructions given to you at your visit today. If you use inhalers (even only as needed), please bring them with you on the day of your procedure. Your physician has requested that you go to www.startemmi.com and enter the access code given to you at your visit today. This web site gives a general overview about your procedure. However, you should still follow specific instructions given to you by our office regarding your preparation for the procedure.  You have been scheduled for a colonoscopy. Please follow written instructions given to you at your visit today.  Please pick up your prep kit at the pharmacy within the next 1-3 days. If you use inhalers (even only as needed), please bring them with you on the day of your procedure. Your physician has requested that you go to www.startemmi.com and enter the access code given to you at your visit today. This web site gives a general overview about your procedure. However, you should still follow specific instructions given to you by our office regarding your preparation for the procedure. 

## 2014-07-23 NOTE — Assessment & Plan Note (Signed)
Rule out early esophageal stricture  Recommendations #1 upper endoscopy with dilation as indicated 

## 2014-07-23 NOTE — Addendum Note (Signed)
Addended by: Oda Kilts on: 07/23/2014 03:24 PM   Modules accepted: Orders

## 2014-07-23 NOTE — Progress Notes (Signed)
_                                                                                                                History of Present Illness:  Alice [name] is a 63 year old white female with a remote history of colon polyps, gastritis referred for evaluation of pyrosis and constipation.  She's having frequent pyrosis and, what sounds like, intermittent dysphagia to solids.  Postprandially if she bends over she may regurgitate gastric contents.  She is on no gastric irritants.  She complains of constipation and requires MiraLax or stool softeners for daily bowel movement.  As colonoscopy in 2004 was unremarkable.   Past Medical History  Diagnosis Date  . Hyperlipidemia   . H/O hiatal hernia   . Depression   . Anxiety   . Sciatica     getting prednisone inj  . DDD (degenerative disc disease), lumbar    Past Surgical History  Procedure Laterality Date  . Appendectomy  1959  . Tonsillectomy  1976  . Rotator cuff repair Left 2003  . Elbow surgery Left 2003  . Eye surgery Right 2012    right-terigium  . Cystoscopy kidney w/ ureteral guide wire    . Breast biopsy Right   . Cyst excision Right     foot   family history includes Aneurysm in her father; Breast cancer in her mother and paternal grandmother; Hyperlipidemia in her mother; Hypertension in her brother, mother, and sister; Stroke in her father. Current Outpatient Prescriptions  Medication Sig Dispense Refill  . chlordiazePOXIDE (LIBRIUM) 10 MG capsule Take 10 mg by mouth 3 (three) times daily as needed.      . clonazePAM (KLONOPIN) 0.5 MG tablet Take 1 mg by mouth at bedtime.       Marland Kitchen estradiol (MINIVELLE) 0.05 MG/24HR patch Place 1 patch onto the skin 2 (two) times a week.      . pravastatin (PRAVACHOL) 10 MG tablet Take 10 mg by mouth daily.      . Progesterone 100 MG CAPS Take 1 capsule by mouth daily.      Marland Kitchen venlafaxine (EFFEXOR) 75 MG tablet Take 75 mg by mouth daily.       No current facility-administered  medications for this visit.   Allergies as of 07/23/2014 - Review Complete 07/23/2014  Allergen Reaction Noted  . Bactrim [sulfamethoxazole-tmp ds]  07/23/2014  . Penicillins  08/11/2012  . Propoxyphene hcl      reports that she has never smoked. She has never used smokeless tobacco. She reports that she does not drink alcohol or use illicit drugs.   Review of Systems: She complains of frequent low-volume urination with occasional incontinence Pertinent positive and negative review of systems were noted in the above HPI section. All other review of systems were otherwise negative.  Vital signs were reviewed in today's medical record Physical Exam: General: Well developed , well nourished, no acute distress Skin: anicteric Head: Normocephalic and atraumatic Eyes:  sclerae anicteric, EOMI Ears: Normal auditory acuity Mouth:  No deformity or lesions Neck: Supple, no masses or thyromegaly Lungs: Clear throughout to auscultation Heart: Regular rate and rhythm; no murmurs, rubs or bruits Abdomen: Soft, non tender and non distended. No masses, hepatosplenomegaly or hernias noted. Normal Bowel sounds Rectal:deferred Musculoskeletal: Symmetrical with no gross deformities  Skin: No lesions on visible extremities Pulses:  Normal pulses noted Extremities: No clubbing, cyanosis, edema or deformities noted Neurological: Alert oriented x 4, grossly nonfocal Cervical Nodes:  No significant cervical adenopathy Inguinal Nodes: No significant inguinal adenopathy Psychological:  Alert and cooperative. Normal mood and affect  See Assessment and Plan under Problem List

## 2014-08-03 ENCOUNTER — Encounter: Payer: Self-pay | Admitting: Gastroenterology

## 2014-08-31 ENCOUNTER — Encounter: Payer: BC Managed Care – PPO | Admitting: Gastroenterology

## 2014-08-31 ENCOUNTER — Encounter: Payer: Self-pay | Admitting: Gastroenterology

## 2014-08-31 ENCOUNTER — Ambulatory Visit (AMBULATORY_SURGERY_CENTER): Payer: BC Managed Care – PPO | Admitting: Gastroenterology

## 2014-08-31 VITALS — BP 125/89 | HR 61 | Temp 97.7°F | Resp 29 | Ht 64.0 in | Wt 154.0 lb

## 2014-08-31 DIAGNOSIS — K222 Esophageal obstruction: Secondary | ICD-10-CM

## 2014-08-31 DIAGNOSIS — K2960 Other gastritis without bleeding: Secondary | ICD-10-CM

## 2014-08-31 DIAGNOSIS — R1310 Dysphagia, unspecified: Secondary | ICD-10-CM

## 2014-08-31 DIAGNOSIS — K269 Duodenal ulcer, unspecified as acute or chronic, without hemorrhage or perforation: Secondary | ICD-10-CM

## 2014-08-31 DIAGNOSIS — K259 Gastric ulcer, unspecified as acute or chronic, without hemorrhage or perforation: Secondary | ICD-10-CM

## 2014-08-31 DIAGNOSIS — K219 Gastro-esophageal reflux disease without esophagitis: Secondary | ICD-10-CM

## 2014-08-31 MED ORDER — SODIUM CHLORIDE 0.9 % IV SOLN: 500.0000 mL | INTRAVENOUS | Status: DC

## 2014-08-31 MED ORDER — PANTOPRAZOLE SODIUM 40 MG PO TBEC: 40.0000 mg | DELAYED_RELEASE_TABLET | Freq: Every day | ORAL | Status: DC

## 2014-08-31 NOTE — Patient Instructions (Addendum)
.  YOU HAD AN ENDOSCOPIC PROCEDURE TODAY AT [facility]: Refer to the procedure report that was given to you for any specific questions about what was found during the examination.  If the procedure report does not answer your questions, please call your gastroenterologist to clarify.  If you requested that your care partner not be given the details of your procedure findings, then the procedure report has been included in a sealed envelope for you to review at your convenience later.  YOU SHOULD EXPECT: Some feelings of bloating in the abdomen. Passage of more gas than usual.  Walking can help get rid of the air that was put into your GI tract during the procedure and reduce the bloating. If you had a lower endoscopy (such as a colonoscopy or flexible sigmoidoscopy) you may notice spotting of blood in your stool or on the toilet paper. If you underwent a bowel prep for your procedure, then you may not have a normal bowel movement for a few days.  DIET:  FOLLOW DILATATION DIET TODAY   ACTIVITY: Your care partner should take you home directly after the procedure.  You should plan to take it easy, moving slowly for the rest of the day.  You can resume normal activity the day after the procedure however you should NOT DRIVE or use heavy machinery for 24 hours (because of the sedation medicines used during the test).    SYMPTOMS TO REPORT IMMEDIATELY: A gastroenterologist can be reached at any hour.  During normal business hours, 8:30 AM to 5:00 PM Monday through Friday, call [phone].  After hours and on weekends, please call the GI answering service at [phone] who will take a message and have the physician on call contact you.     Following upper endoscopy (EGD)  Vomiting of blood or coffee ground material  New chest pain or pain under the shoulder blades  Painful or persistently difficult swallowing  New shortness of breath  Fever of 100F or higher  Black,  tarry-looking stools  FOLLOW UP: If any biopsies were taken you will be contacted by phone or by letter within the next 1-3 weeks.  Call your gastroenterologist if you have not heard about the biopsies in 3 weeks.  Our staff will call the home number listed on your records the next business day following your procedure to check on you and address any questions or concerns that you may have at that time regarding the information given to you following your procedure. This is a courtesy call and so if there is no answer at the home number and we have not heard from you through the emergency physician on call, we will assume that you have returned to your regular daily activities without incident.  SIGNATURES/CONFIDENTIALITY: You and/or your care partner have signed paperwork which will be entered into your electronic medical record.  These signatures attest to the fact that that the information above on your After Visit Summary has been reviewed and is understood.  Full responsibility of the confidentiality of this discharge information lies with you and/or your care-partner.   PROTONIX 40 MG DAILY ORDER SENT TO YOUR PHARMACY FOR PICK UP  FOLLOW DILATATION DIET GIVEN TO YOU TODAY  AWAIT BIOPSY RESULTS  MAKE APPOINTMENT TO SEE DR Deatra Ina IN OFFICE IN 4-6 WEEKS

## 2014-08-31 NOTE — Progress Notes (Signed)
Patient awakening,vss,report to rn 

## 2014-08-31 NOTE — Progress Notes (Signed)
Called to room to assist during endoscopic procedure.  Patient ID and intended procedure confirmed with present staff. Received instructions for my participation in the procedure from the performing physician.  

## 2014-08-31 NOTE — Op Note (Signed)
[place]  [address]. [place], [address]   ENDOSCOPY PROCEDURE REPORT  PATIENT: Alice, [name]  MR#: [id number] BIRTHDATE: September 02, 1951 , 39  yrs. old GENDER: female ENDOSCOPIST: Inda Castle, MD REFERRED BY:  Azalia Bilis, M.D. PROCEDURE DATE:  08/31/2014 PROCEDURE:  EGD w/ biopsy and Maloney dilation of esophagus ASA CLASS:     Class II INDICATIONS:  heartburn and dysphagia. MEDICATIONS: Monitored anesthesia care, Propofol 100 mg IV, and simethacone 40mg  po TOPICAL ANESTHETIC: Cetacaine Spray  DESCRIPTION OF PROCEDURE: After the risks benefits and alternatives of the procedure were thoroughly explained, informed consent was obtained.  The LB [id number] endoscope was introduced through the mouth and advanced to the    , Without limitations. The instrument was slowly withdrawn as the mucosa was fully examined.      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 The stricture was traversable.  The stricture was dilated using a 67mm (52Fr) Maloney dilator.  STOMACH: Multiple non-bleeding ulcers measuring 1 x 22mm in size were found in the gastric antrum.  Biopsies were taken.  DUODENUM: A single non-bleeding ulcer measuring 1 x 96mm in size was found in the duodenal bulb.  Retroflexed views revealed no abnormalities.     The scope was then withdrawn from the patient and the procedure completed.  COMPLICATIONS: There were no immediate complications.  ENDOSCOPIC IMPRESSION: 1.   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   Multiple ulcers measuring 1 x 64mm in size were found in the gastric antrum; biopsies were taken 3.   Single ulcer measuring 1 x 40mm in size was found in the duodenal bulb  RECOMMENDATIONS: Await biopsy results Protonix 40mg  qd OV 4-6 weeks  REPEAT EXAM:  eSigned:  Inda Castle, MD 08/31/2014 4:08 PM    CC:  PATIENT  NAME:  Alice, [name] MR#: [id number]

## 2014-09-01 ENCOUNTER — Telehealth: Payer: Self-pay

## 2014-09-01 NOTE — Telephone Encounter (Signed)
  Follow up Call-  Call back number 08/31/2014  Post procedure Call Back phone  # [phone]  Permission to leave phone message Yes     Patient questions:  Do you have a fever, pain , or abdominal swelling? No. Pain Score  0 *  Have you tolerated food without any problems? Yes.    Have you been able to return to your normal activities? Yes.    Do you have any questions about your discharge instructions: Diet   No. Medications  No. Follow up visit  No.  Do you have questions or concerns about your Care? No.  Actions: * If pain score is 4 or above: No action needed, pain <4.  Per the pt she said that as soon as the propofol was pushed IV yesterday, that she heard a roaring in bilateral ears.  She said that when she was awaking in the recovery room that her bilateral ears had a ringing in then and they both feel muffled still this am.  I spoke with Barkley Boards, CRNA and she said the propofo was short acting and it would not be in her system now.  Marcie Bal felt it was a coincidence that the sx began while the propofol was given.  I advised the pt to call us back if her sx do not resolve on their own.  Pt said she would. maw

## 2014-09-02 ENCOUNTER — Ambulatory Visit: Payer: BC Managed Care – PPO | Admitting: Gastroenterology

## 2014-09-06 ENCOUNTER — Encounter: Payer: Self-pay | Admitting: Gastroenterology

## 2014-09-29 ENCOUNTER — Encounter: Payer: Self-pay | Admitting: Gastroenterology

## 2014-09-29 ENCOUNTER — Ambulatory Visit (AMBULATORY_SURGERY_CENTER): Payer: BC Managed Care – PPO | Admitting: Gastroenterology

## 2014-09-29 VITALS — BP 112/57 | HR 57 | Temp 97.7°F | Resp 16 | Ht 64.0 in | Wt 154.0 lb

## 2014-09-29 DIAGNOSIS — Z1211 Encounter for screening for malignant neoplasm of colon: Secondary | ICD-10-CM

## 2014-09-29 MED ORDER — SODIUM CHLORIDE 0.9 % IV SOLN: 500.0000 mL | INTRAVENOUS | Status: DC

## 2014-09-29 NOTE — Progress Notes (Signed)
Report to PACU, RN, vss, BBS= Clear.  

## 2014-09-29 NOTE — Op Note (Signed)
[place]  [address]. [place], [address]   COLONOSCOPY PROCEDURE REPORT  PATIENT: Alice, [name]  MR#: [id number] BIRTHDATE: 1951/04/05 , 34  yrs. old GENDER: female ENDOSCOPIST: Inda Castle, MD REFERRED BY: PROCEDURE DATE:  09/29/2014 PROCEDURE:   Colonoscopy, diagnostic First Screening Colonoscopy - Avg.  risk and is 50 yrs.  old or older - No.  Prior Negative Screening - Now for repeat screening. N/A  History of Adenoma - Now for follow-up colonoscopy & has been > or = to 3 yrs.  N/A  Polyps Removed Today? No.  Recommend repeat exam, <10 yrs? No. ASA CLASS:   Class II INDICATIONS:average risk for colon cancer. MEDICATIONS: Monitored anesthesia care and Propofol 230 mg IV  DESCRIPTION OF PROCEDURE:   After the risks benefits and alternatives of the procedure were thoroughly explained, informed consent was obtained.  The digital rectal exam revealed no abnormalities of the rectum.   The LB [id number]  endoscope was introduced through the anus and advanced to the cecum, which was identified by both the appendix and ileocecal valve. No adverse events experienced.   The quality of the prep was good, using MoviPrep  The instrument was then slowly withdrawn as the colon was fully examined.      COLON FINDINGS: A normal appearing cecum, ileocecal valve, and appendiceal orifice were identified.  The ascending, transverse, descending, sigmoid colon, and rectum appeared unremarkable. Retroflexed views revealed no abnormalities. The time to cecum=3 minutes 14 seconds.  Withdrawal time=8 minutes 39 seconds.  The scope was withdrawn and the procedure completed. COMPLICATIONS: There were no immediate complications.  ENDOSCOPIC IMPRESSION: Normal colonoscopy  RECOMMENDATIONS: Continue current colorectal screening recommendations for "routine risk" patients with a repeat colonoscopy in 10 years.  eSigned:  Inda Castle, MD 09/29/2014 1:56 PM   cc:  Shirline Frees MD

## 2014-09-29 NOTE — Patient Instructions (Signed)
Impressions/recommendations:  Normal colonoscopy  Repeat colonoscopy in 10 years.  YOU HAD AN ENDOSCOPIC PROCEDURE TODAY AT THE [facility]: Refer to the procedure report that was given to you for any specific questions about what was found during the examination.  If the procedure report does not answer your questions, please call your gastroenterologist to clarify.  If you requested that your care partner not be given the details of your procedure findings, then the procedure report has been included in a sealed envelope for you to review at your convenience later.  YOU SHOULD EXPECT: Some feelings of bloating in the abdomen. Passage of more gas than usual.  Walking can help get rid of the air that was put into your GI tract during the procedure and reduce the bloating. If you had a lower endoscopy (such as a colonoscopy or flexible sigmoidoscopy) you may notice spotting of blood in your stool or on the toilet paper. If you underwent a bowel prep for your procedure, then you may not have a normal bowel movement for a few days.  DIET: Your first meal following the procedure should be a light meal and then it is ok to progress to your normal diet.  A half-sandwich or bowl of soup is an example of a good first meal.  Heavy or fried foods are harder to digest and may make you feel nauseous or bloated.  Likewise meals heavy in dairy and vegetables can cause extra gas to form and this can also increase the bloating.  Drink plenty of fluids but you should avoid alcoholic beverages for 24 hours.  ACTIVITY: Your care partner should take you home directly after the procedure.  You should plan to take it easy, moving slowly for the rest of the day.  You can resume normal activity the day after the procedure however you should NOT DRIVE or use heavy machinery for 24 hours (because of the sedation medicines used during the test).    SYMPTOMS TO REPORT IMMEDIATELY: A gastroenterologist can be reached  at any hour.  During normal business hours, 8:30 AM to 5:00 PM Monday through Friday, call [phone].  After hours and on weekends, please call the GI answering service at [phone] who will take a message and have the physician on call contact you.   Following lower endoscopy (colonoscopy or flexible sigmoidoscopy):  Excessive amounts of blood in the stool  Significant tenderness or worsening of abdominal pains  Swelling of the abdomen that is new, acute  Fever of 100F or higher   FOLLOW UP: If any biopsies were taken you will be contacted by phone or by letter within the next 1-3 weeks.  Call your gastroenterologist if you have not heard about the biopsies in 3 weeks.  Our staff will call the home number listed on your records the next business day following your procedure to check on you and address any questions or concerns that you may have at that time regarding the information given to you following your procedure. This is a courtesy call and so if there is no answer at the home number and we have not heard from you through the emergency physician on call, we will assume that you have returned to your regular daily activities without incident.  SIGNATURES/CONFIDENTIALITY: You and/or your care partner have signed paperwork which will be entered into your electronic medical record.  These signatures attest to the fact that that the information above on your After Visit Summary has been   and is understood.  Full responsibility of the confidentiality of this discharge information lies with you and/or your care-partner. 

## 2014-09-30 ENCOUNTER — Telehealth: Payer: Self-pay

## 2014-09-30 NOTE — Telephone Encounter (Signed)
  Follow up Call-  Call back number 09/29/2014 08/31/2014  Post procedure Call Back phone  # [phone]  Permission to leave phone message Yes Yes     Patient questions:  Do you have a fever, pain , or abdominal swelling? No. Pain Score  0 *  Have you tolerated food without any problems? Yes.    Have you been able to return to your normal activities? Yes.    Do you have any questions about your discharge instructions: Diet   No. Medications  No. Follow up visit  No.  Do you have questions or concerns about your Care? No.  Actions: * If pain score is 4 or above: No action needed, pain <4.   No problems per the pt. maw

## 2014-11-03 ENCOUNTER — Ambulatory Visit: Payer: BC Managed Care – PPO | Admitting: Gastroenterology

## 2014-12-28 ENCOUNTER — Ambulatory Visit: Payer: BC Managed Care – PPO | Admitting: Gastroenterology

## 2014-12-30 ENCOUNTER — Encounter: Payer: Self-pay | Admitting: Gastroenterology

## 2014-12-30 ENCOUNTER — Ambulatory Visit (INDEPENDENT_AMBULATORY_CARE_PROVIDER_SITE_OTHER): Payer: BLUE CROSS/BLUE SHIELD | Admitting: Gastroenterology

## 2014-12-30 VITALS — BP 120/70 | HR 68 | Ht 64.5 in | Wt 160.0 lb

## 2014-12-30 DIAGNOSIS — K5900 Constipation, unspecified: Secondary | ICD-10-CM

## 2014-12-30 DIAGNOSIS — D126 Benign neoplasm of colon, unspecified: Secondary | ICD-10-CM

## 2014-12-30 DIAGNOSIS — R1310 Dysphagia, unspecified: Secondary | ICD-10-CM

## 2014-12-30 NOTE — Assessment & Plan Note (Signed)
Resolved following dilation of an early esophageal stricture

## 2014-12-30 NOTE — Progress Notes (Signed)
      History of Present Illness:  Alice [name] has returned following upper and lower endoscopy.  The former demonstrated a few superficial also some the antrum.  Biopsies were negative.  A very early distal esophageal stricture was dilated to 18 mm.  Colonoscopy was normal.  Dysphagia has entirely subsided and reflux symptoms are well controlled on daily Protonix.  She complains of occasional mild constipation area    Review of Systems: Pertinent positive and negative review of systems were noted in the above HPI section. All other review of systems were otherwise negative.    Current Medications, Allergies, Past Medical History, Past Surgical History, Family History and Social History were reviewed in [facility] record  Vital signs were reviewed in today's medical record. Physical Exam: General: Well developed , well nourished, no acute distress   See Assessment and Plan under Problem List

## 2014-12-30 NOTE — Assessment & Plan Note (Signed)
Remote history of colon polyps.  Colonoscopy in 2015 entirely normal.  Plan follow-up colonoscopy in 2025

## 2014-12-30 NOTE — Assessment & Plan Note (Signed)
Patient has mild functional constipation.  Recommended fiber supplementation.

## 2014-12-30 NOTE — Patient Instructions (Signed)
Please follow up as needed 

## 2015-02-01 ENCOUNTER — Other Ambulatory Visit: Payer: Self-pay | Admitting: Neurosurgery

## 2015-02-01 DIAGNOSIS — M5416 Radiculopathy, lumbar region: Secondary | ICD-10-CM

## 2015-02-18 ENCOUNTER — Ambulatory Visit
Admission: RE | Admit: 2015-02-18 | Discharge: 2015-02-18 | Disposition: A | Discharge status: Home / Self Care | Payer: BLUE CROSS/BLUE SHIELD | Source: Ambulatory Visit | Attending: Neurosurgery | Admitting: Neurosurgery

## 2015-02-18 VITALS — BP 122/63 | HR 67

## 2015-02-18 DIAGNOSIS — M5416 Radiculopathy, lumbar region: Secondary | ICD-10-CM

## 2015-02-18 MED ORDER — ONDANSETRON HCL 4 MG/2ML IJ SOLN
4.0000 mg | Freq: Once | INTRAMUSCULAR | Status: AC
  Administered 2015-02-18: 4 mg via INTRAMUSCULAR

## 2015-02-18 MED ORDER — MEPERIDINE HCL 100 MG/ML IJ SOLN
100.0000 mg | Freq: Once | INTRAMUSCULAR | Status: AC
  Administered 2015-02-18: 100 mg via INTRAMUSCULAR

## 2015-02-18 MED ORDER — IOHEXOL 180 MG/ML  SOLN
20.0000 mL | Freq: Once | INTRAMUSCULAR | Status: AC | PRN
  Administered 2015-02-18: 20 mL via INTRATHECAL

## 2015-02-18 MED ORDER — DIAZEPAM 5 MG PO TABS
5.0000 mg | ORAL_TABLET | Freq: Once | ORAL | Status: AC
Start: 2015-02-18 — End: 2015-02-18
  Administered 2015-02-18: 10 mg via ORAL

## 2015-02-18 NOTE — Discharge Instructions (Signed)
Myelogram Discharge Instructions  1. Go home and rest quietly for the next 24 hours.  It is important to lie flat for the next 24 hours.  Get up only to go to the restroom.  You may lie in the bed or on a couch on your back, your stomach, your left side or your right side.  You may have one pillow under your head.  You may have pillows between your knees while you are on your side or under your knees while you are on your back.  2. DO NOT drive today.  Recline the seat as far back as it will go, while still wearing your seat belt, on the way home.  3. You may get up to go to the bathroom as needed.  You may sit up for 10 minutes to eat.  You may resume your normal diet and medications unless otherwise indicated.  Drink lots of extra fluids today and tomorrow.  4. The incidence of headache, nausea, or vomiting is about 5% (one in 20 patients).  If you develop a headache, lie flat and drink plenty of fluids until the headache goes away.  Caffeinated beverages may be helpful.  If you develop severe nausea and vomiting or a headache that does not go away with flat bed rest, call [phone].  5. You may resume normal activities after your 24 hours of bed rest is over; however, do not exert yourself strongly or do any heavy lifting tomorrow. If when you get up you have a headache when standing, go back to bed and force fluids for another 24 hours.  6. Call your physician for a follow-up appointment.  The results of your myelogram will be sent directly to your physician by the following day.  7. If you have any questions or if complications develop after you arrive home, please call [phone].  Discharge instructions have been explained to the patient.  The patient, or the person responsible for the patient, fully understands these instructions.       May resume Effexor on February 19, 2015, after 7:30 am.

## 2015-02-18 NOTE — Progress Notes (Signed)
Pt states she has been off Effexor for the past 2 days. Discharge instructions explained to pt.

## 2015-05-26 ENCOUNTER — Other Ambulatory Visit: Payer: Self-pay | Admitting: Orthopedic Surgery

## 2015-05-31 ENCOUNTER — Encounter (HOSPITAL_COMMUNITY)
Admission: RE | Admit: 2015-05-31 | Discharge: 2015-05-31 | Disposition: A | Discharge status: Home / Self Care | Payer: BLUE CROSS/BLUE SHIELD | Source: Ambulatory Visit | Attending: Orthopedic Surgery | Admitting: Orthopedic Surgery

## 2015-05-31 ENCOUNTER — Encounter (HOSPITAL_COMMUNITY): Payer: Self-pay

## 2015-05-31 DIAGNOSIS — Z01818 Encounter for other preprocedural examination: Secondary | ICD-10-CM

## 2015-05-31 DIAGNOSIS — R001 Bradycardia, unspecified: Secondary | ICD-10-CM | POA: Diagnosis not present

## 2015-05-31 DIAGNOSIS — Z01812 Encounter for preprocedural laboratory examination: Secondary | ICD-10-CM | POA: Insufficient documentation

## 2015-05-31 DIAGNOSIS — M5136 Other intervertebral disc degeneration, lumbar region: Secondary | ICD-10-CM | POA: Insufficient documentation

## 2015-05-31 DIAGNOSIS — Z0183 Encounter for blood typing: Secondary | ICD-10-CM | POA: Insufficient documentation

## 2015-05-31 HISTORY — DX: Calculus of kidney: N20.0

## 2015-05-31 HISTORY — DX: Gastro-esophageal reflux disease without esophagitis: K21.9

## 2015-05-31 LAB — CBC WITH DIFFERENTIAL/PLATELET
Basophils Absolute: 0 10*3/uL (ref 0.0–0.1)
Basophils Relative: 0 % (ref 0–1)
Eosinophils Absolute: 0.1 10*3/uL (ref 0.0–0.7)
Eosinophils Relative: 1 % (ref 0–5)
HCT: 34.6 % — ABNORMAL LOW (ref 36.0–46.0)
HEMOGLOBIN: 10.6 g/dL — AB (ref 12.0–15.0)
Lymphocytes Relative: 28 % (ref 12–46)
Lymphs Abs: 1.7 10*3/uL (ref 0.7–4.0)
MCH: 23.6 pg — ABNORMAL LOW (ref 26.0–34.0)
MCHC: 30.6 g/dL (ref 30.0–36.0)
MCV: 77.1 fL — AB (ref 78.0–100.0)
MONO ABS: 0.5 10*3/uL (ref 0.1–1.0)
MONOS PCT: 8 % (ref 3–12)
NEUTROS PCT: 63 % (ref 43–77)
Neutro Abs: 3.6 10*3/uL (ref 1.7–7.7)
Platelets: 224 10*3/uL (ref 150–400)
RBC: 4.49 MIL/uL (ref 3.87–5.11)
RDW: 14.6 % (ref 11.5–15.5)
WBC: 5.8 10*3/uL (ref 4.0–10.5)

## 2015-05-31 LAB — URINALYSIS, ROUTINE W REFLEX MICROSCOPIC
BILIRUBIN URINE: NEGATIVE
Glucose, UA: NEGATIVE mg/dL
HGB URINE DIPSTICK: NEGATIVE
Ketones, ur: NEGATIVE mg/dL
Nitrite: NEGATIVE
PROTEIN: NEGATIVE mg/dL
Specific Gravity, Urine: 1.02 (ref 1.005–1.030)
Urobilinogen, UA: 0.2 mg/dL (ref 0.0–1.0)
pH: 6.5 (ref 5.0–8.0)

## 2015-05-31 LAB — COMPREHENSIVE METABOLIC PANEL
ALK PHOS: 88 U/L (ref 38–126)
ALT: 14 U/L (ref 14–54)
AST: 16 U/L (ref 15–41)
Albumin: 3.7 g/dL (ref 3.5–5.0)
Anion gap: 4 — ABNORMAL LOW (ref 5–15)
BILIRUBIN TOTAL: 1.2 mg/dL (ref 0.3–1.2)
BUN: 17 mg/dL (ref 6–20)
CHLORIDE: 108 mmol/L (ref 101–111)
CO2: 27 mmol/L (ref 22–32)
CREATININE: 0.96 mg/dL (ref 0.44–1.00)
Calcium: 9.1 mg/dL (ref 8.9–10.3)
GFR calc non Af Amer: >60 mL/min (ref >60)
GLUCOSE: 90 mg/dL (ref 65–99)
Potassium: 4.5 mmol/L (ref 3.5–5.1)
Sodium: 139 mmol/L (ref 135–145)
TOTAL PROTEIN: 6.6 g/dL (ref 6.5–8.1)

## 2015-05-31 LAB — TYPE AND SCREEN
ABO/RH(D): O POS
Antibody Screen: NEGATIVE

## 2015-05-31 LAB — URINE MICROSCOPIC-ADD ON

## 2015-05-31 LAB — SURGICAL PCR SCREEN
MRSA, PCR: NEGATIVE
STAPHYLOCOCCUS AUREUS: NEGATIVE

## 2015-05-31 LAB — PROTIME-INR
INR: 1 (ref 0.00–1.49)
PROTHROMBIN TIME: 13.4 s (ref 11.6–15.2)

## 2015-05-31 LAB — ABO/RH: ABO/RH(D): O POS

## 2015-05-31 LAB — APTT: aPTT: 28 seconds (ref 24–37)

## 2015-05-31 NOTE — Pre-Procedure Instructions (Addendum)
    Alice [name]  05/31/2015      [id number] [address], [place] [address]. [place] [address] Phone: [phone] Fax: [phone]    Your procedure is scheduled on 06/09/2015 at 7:30 A.M.  Report to [facility] Admitting at 5:30 A.M.  Call this number if you have problems the morning of surgery:  [phone]   Remember:  Do not eat food or drink liquids after midnight.  Take these medicines the morning of surgery with A SIP OF WATER Doxycycline, cymbalta, sertraline, protonix, nystatin, eyedrops, estradiol and progesterone.    STOP taking Naproxen or any other Nonsteroidal Anti-inflammatories (NSAIDs), Goody's/BC Powders, Aspirins, Vitamins, and Herbal Supplements 7 days prior to surgery.     Do not wear jewelry, make-up or nail polish.  Do not wear lotions, powders, or perfumes.  You may wear deodorant.  Do not shave 48 hours prior to surgery.    Do not bring valuables to the hospital.  [facility] is not responsible for any belongings or valuables.  Contacts, dentures or bridgework may not be worn into surgery.  Leave your suitcase in the car.  After surgery it may be brought to your room.  For patients admitted to the hospital, discharge time will be determined by your treatment team.  Patients discharged the day of surgery will not be allowed to drive home.    Please read over the following fact sheets that you were given. Pain Booklet, Coughing and Deep Breathing, Blood Transfusion Information, MRSA Information and Surgical Site Infection Prevention

## 2015-06-08 MED ORDER — VANCOMYCIN HCL IN DEXTROSE 1-5 GM/200ML-% IV SOLN
1000.0000 mg | INTRAVENOUS | Status: AC
  Administered 2015-06-09: 1000 mg via INTRAVENOUS
  Filled 2015-06-08: qty 200

## 2015-06-09 ENCOUNTER — Inpatient Hospital Stay (HOSPITAL_COMMUNITY)
Admission: RE | Admit: 2015-06-09 | Discharge: 2015-06-10 | DRG: 460 | Disposition: A | Discharge status: Home / Self Care | Payer: BLUE CROSS/BLUE SHIELD | Source: Ambulatory Visit | Attending: Orthopedic Surgery | Admitting: Orthopedic Surgery

## 2015-06-09 ENCOUNTER — Inpatient Hospital Stay (HOSPITAL_COMMUNITY): Payer: BLUE CROSS/BLUE SHIELD | Admitting: Anesthesiology

## 2015-06-09 ENCOUNTER — Inpatient Hospital Stay (HOSPITAL_COMMUNITY): Payer: BLUE CROSS/BLUE SHIELD

## 2015-06-09 ENCOUNTER — Encounter (HOSPITAL_COMMUNITY)
Admission: RE | Disposition: A | Discharge status: Home / Self Care | Payer: BLUE CROSS/BLUE SHIELD | Source: Ambulatory Visit | Attending: Orthopedic Surgery

## 2015-06-09 DIAGNOSIS — F419 Anxiety disorder, unspecified: Secondary | ICD-10-CM | POA: Diagnosis present

## 2015-06-09 DIAGNOSIS — F329 Major depressive disorder, single episode, unspecified: Secondary | ICD-10-CM | POA: Diagnosis present

## 2015-06-09 DIAGNOSIS — Z880 Allergy status to penicillin: Secondary | ICD-10-CM | POA: Diagnosis not present

## 2015-06-09 DIAGNOSIS — K219 Gastro-esophageal reflux disease without esophagitis: Secondary | ICD-10-CM | POA: Diagnosis present

## 2015-06-09 DIAGNOSIS — Z79899 Other long term (current) drug therapy: Secondary | ICD-10-CM

## 2015-06-09 DIAGNOSIS — Z791 Long term (current) use of non-steroidal anti-inflammatories (NSAID): Secondary | ICD-10-CM

## 2015-06-09 DIAGNOSIS — M79605 Pain in left leg: Secondary | ICD-10-CM | POA: Diagnosis present

## 2015-06-09 DIAGNOSIS — Z881 Allergy status to other antibiotic agents status: Secondary | ICD-10-CM | POA: Diagnosis not present

## 2015-06-09 DIAGNOSIS — Z888 Allergy status to other drugs, medicaments and biological substances status: Secondary | ICD-10-CM | POA: Diagnosis not present

## 2015-06-09 DIAGNOSIS — M5430 Sciatica, unspecified side: Secondary | ICD-10-CM | POA: Diagnosis present

## 2015-06-09 DIAGNOSIS — Z419 Encounter for procedure for purposes other than remedying health state, unspecified: Secondary | ICD-10-CM

## 2015-06-09 DIAGNOSIS — M4806 Spinal stenosis, lumbar region: Principal | ICD-10-CM | POA: Diagnosis present

## 2015-06-09 DIAGNOSIS — E785 Hyperlipidemia, unspecified: Secondary | ICD-10-CM | POA: Diagnosis present

## 2015-06-09 DIAGNOSIS — M4800 Spinal stenosis, site unspecified: Secondary | ICD-10-CM | POA: Diagnosis present

## 2015-06-09 SURGERY — POSTERIOR LUMBAR FUSION 2 LEVEL
Anesthesia: General | Site: Spine Lumbar | Laterality: Left

## 2015-06-09 MED ORDER — BUPIVACAINE-EPINEPHRINE 0.25% -1:200000 IJ SOLN
INTRAMUSCULAR | Status: DC | PRN
  Administered 2015-06-09: 24 mL

## 2015-06-09 MED ORDER — ACETAMINOPHEN 650 MG RE SUPP: 650.0000 mg | RECTAL | Status: DC | PRN

## 2015-06-09 MED ORDER — ONDANSETRON HCL 4 MG/2ML IJ SOLN
INTRAMUSCULAR | Status: DC | PRN
  Administered 2015-06-09: 4 mg via INTRAVENOUS

## 2015-06-09 MED ORDER — ROCURONIUM BROMIDE 100 MG/10ML IV SOLN
INTRAVENOUS | Status: DC | PRN
  Administered 2015-06-09: 50 mg via INTRAVENOUS

## 2015-06-09 MED ORDER — EPHEDRINE SULFATE 50 MG/ML IJ SOLN
INTRAMUSCULAR | Status: DC | PRN
  Administered 2015-06-09: 20 mg via INTRAVENOUS
  Administered 2015-06-09 (×2): 10 mg via INTRAVENOUS

## 2015-06-09 MED ORDER — HYDROMORPHONE HCL 1 MG/ML IJ SOLN
0.2500 mg | INTRAMUSCULAR | Status: DC | PRN
  Administered 2015-06-09 (×2): 0.5 mg via INTRAVENOUS

## 2015-06-09 MED ORDER — CLONAZEPAM 0.5 MG PO TABS
1.0000 mg | ORAL_TABLET | Freq: Every day | ORAL | Status: DC
  Administered 2015-06-09: 1 mg via ORAL
  Filled 2015-06-09: qty 2

## 2015-06-09 MED ORDER — METHYLENE BLUE 1 % INJ SOLN
INTRAMUSCULAR | Status: AC
  Filled 2015-06-09: qty 10

## 2015-06-09 MED ORDER — BUPIVACAINE-EPINEPHRINE (PF) 0.25% -1:200000 IJ SOLN
INTRAMUSCULAR | Status: AC
  Filled 2015-06-09: qty 30

## 2015-06-09 MED ORDER — LACTATED RINGERS IV SOLN
INTRAVENOUS | Status: DC | PRN
  Administered 2015-06-09 (×3): via INTRAVENOUS

## 2015-06-09 MED ORDER — ONDANSETRON HCL 4 MG/2ML IJ SOLN
INTRAMUSCULAR | Status: AC
  Filled 2015-06-09: qty 2

## 2015-06-09 MED ORDER — VANCOMYCIN HCL IN DEXTROSE 1-5 GM/200ML-% IV SOLN
1000.0000 mg | Freq: Once | INTRAVENOUS | Status: AC
  Administered 2015-06-09: 1000 mg via INTRAVENOUS
  Filled 2015-06-09: qty 200

## 2015-06-09 MED ORDER — PROMETHAZINE HCL 25 MG/ML IJ SOLN: 6.2500 mg | INTRAMUSCULAR | Status: DC | PRN

## 2015-06-09 MED ORDER — FENTANYL CITRATE (PF) 250 MCG/5ML IJ SOLN
INTRAMUSCULAR | Status: AC
  Filled 2015-06-09: qty 5

## 2015-06-09 MED ORDER — HYDROMORPHONE HCL 1 MG/ML IJ SOLN
INTRAMUSCULAR | Status: AC
  Filled 2015-06-09: qty 1

## 2015-06-09 MED ORDER — PROPOFOL INFUSION 10 MG/ML OPTIME
INTRAVENOUS | Status: DC | PRN
  Administered 2015-06-09: 50 ug/kg/min via INTRAVENOUS

## 2015-06-09 MED ORDER — ROCURONIUM BROMIDE 50 MG/5ML IV SOLN
INTRAVENOUS | Status: AC
  Filled 2015-06-09: qty 2

## 2015-06-09 MED ORDER — NEOSTIGMINE METHYLSULFATE 10 MG/10ML IV SOLN
INTRAVENOUS | Status: AC
  Filled 2015-06-09: qty 2

## 2015-06-09 MED ORDER — SODIUM CHLORIDE 0.9 % IV SOLN
INTRAVENOUS | Status: DC
  Administered 2015-06-09: 75 mL/h via INTRAVENOUS

## 2015-06-09 MED ORDER — SUCCINYLCHOLINE CHLORIDE 20 MG/ML IJ SOLN
INTRAMUSCULAR | Status: DC | PRN
  Administered 2015-06-09: 120 mg via INTRAVENOUS

## 2015-06-09 MED ORDER — NYSTATIN 100000 UNIT/ML MT SUSP
5.0000 mL | Freq: Four times a day (QID) | OROMUCOSAL | Status: DC | PRN
  Filled 2015-06-09: qty 10

## 2015-06-09 MED ORDER — ZOLPIDEM TARTRATE 5 MG PO TABS: 5.0000 mg | ORAL_TABLET | Freq: Every evening | ORAL | Status: DC | PRN

## 2015-06-09 MED ORDER — PHENYLEPHRINE HCL 10 MG/ML IJ SOLN
INTRAMUSCULAR | Status: DC | PRN
  Administered 2015-06-09: 40 ug via INTRAVENOUS

## 2015-06-09 MED ORDER — DIAZEPAM 5 MG PO TABS
ORAL_TABLET | ORAL | Status: AC
  Filled 2015-06-09: qty 1

## 2015-06-09 MED ORDER — GLYCOPYRROLATE 0.2 MG/ML IJ SOLN
INTRAMUSCULAR | Status: DC | PRN
  Administered 2015-06-09: .4 mg via INTRAVENOUS
  Administered 2015-06-09: .2 mg via INTRAVENOUS

## 2015-06-09 MED ORDER — ONDANSETRON HCL 4 MG/2ML IJ SOLN: 4.0000 mg | INTRAMUSCULAR | Status: DC | PRN

## 2015-06-09 MED ORDER — SODIUM CHLORIDE 0.9 % IV SOLN: 250.0000 mL | INTRAVENOUS | Status: DC

## 2015-06-09 MED ORDER — PROPOFOL 10 MG/ML IV BOLUS
INTRAVENOUS | Status: DC | PRN
  Administered 2015-06-09: 100 mg via INTRAVENOUS

## 2015-06-09 MED ORDER — OXYCODONE-ACETAMINOPHEN 5-325 MG PO TABS
1.0000 | ORAL_TABLET | ORAL | Status: DC | PRN
  Administered 2015-06-09 – 2015-06-10 (×4): 2 via ORAL
  Filled 2015-06-09 (×4): qty 2

## 2015-06-09 MED ORDER — DOCUSATE SODIUM 100 MG PO CAPS
100.0000 mg | ORAL_CAPSULE | Freq: Two times a day (BID) | ORAL | Status: DC
  Administered 2015-06-09 – 2015-06-10 (×2): 100 mg via ORAL
  Filled 2015-06-09: qty 1

## 2015-06-09 MED ORDER — SERTRALINE HCL 50 MG PO TABS
50.0000 mg | ORAL_TABLET | Freq: Every day | ORAL | Status: DC
  Administered 2015-06-10: 50 mg via ORAL
  Filled 2015-06-09: qty 1

## 2015-06-09 MED ORDER — LIDOCAINE HCL 4 % MT SOLN
OROMUCOSAL | Status: DC | PRN
  Administered 2015-06-09: 4 mL via TOPICAL

## 2015-06-09 MED ORDER — MORPHINE SULFATE 2 MG/ML IJ SOLN
1.0000 mg | INTRAMUSCULAR | Status: DC | PRN
  Administered 2015-06-09: 2 mg via INTRAVENOUS
  Filled 2015-06-09: qty 1

## 2015-06-09 MED ORDER — DEXAMETHASONE SODIUM PHOSPHATE 10 MG/ML IJ SOLN
INTRAMUSCULAR | Status: AC
  Filled 2015-06-09: qty 1

## 2015-06-09 MED ORDER — ACETAMINOPHEN 325 MG PO TABS: 650.0000 mg | ORAL_TABLET | ORAL | Status: DC | PRN

## 2015-06-09 MED ORDER — PANTOPRAZOLE SODIUM 40 MG IV SOLR: 40.0000 mg | Freq: Every day | INTRAVENOUS | Status: DC

## 2015-06-09 MED ORDER — 0.9 % SODIUM CHLORIDE (POUR BTL) OPTIME
TOPICAL | Status: DC | PRN
  Administered 2015-06-09 (×2): 1000 mL

## 2015-06-09 MED ORDER — DEXAMETHASONE SODIUM PHOSPHATE 10 MG/ML IJ SOLN
INTRAMUSCULAR | Status: DC | PRN
Start: 2015-06-09 — End: 2015-06-09
  Administered 2015-06-09: 10 mg via INTRAVENOUS

## 2015-06-09 MED ORDER — POVIDONE-IODINE 7.5 % EX SOLN
Freq: Once | CUTANEOUS | Status: DC
  Filled 2015-06-09: qty 118

## 2015-06-09 MED ORDER — SODIUM CHLORIDE 0.9 % IJ SOLN: 3.0000 mL | Freq: Two times a day (BID) | INTRAMUSCULAR | Status: DC

## 2015-06-09 MED ORDER — MIDAZOLAM HCL 2 MG/2ML IJ SOLN
INTRAMUSCULAR | Status: AC
  Filled 2015-06-09: qty 2

## 2015-06-09 MED ORDER — SUCCINYLCHOLINE CHLORIDE 20 MG/ML IJ SOLN
INTRAMUSCULAR | Status: AC
  Filled 2015-06-09: qty 1

## 2015-06-09 MED ORDER — HYDROCODONE-ACETAMINOPHEN 5-325 MG PO TABS: 1.0000 | ORAL_TABLET | ORAL | Status: DC | PRN

## 2015-06-09 MED ORDER — THROMBIN 20000 UNITS EX SOLR
CUTANEOUS | Status: DC | PRN
  Administered 2015-06-09: 20 mL via TOPICAL

## 2015-06-09 MED ORDER — BISACODYL 5 MG PO TBEC
5.0000 mg | DELAYED_RELEASE_TABLET | Freq: Every day | ORAL | Status: DC | PRN
  Filled 2015-06-09: qty 1

## 2015-06-09 MED ORDER — PROGESTERONE MICRONIZED 100 MG PO CAPS
100.0000 mg | ORAL_CAPSULE | Freq: Every day | ORAL | Status: DC
  Administered 2015-06-10: 100 mg via ORAL
  Filled 2015-06-09: qty 1

## 2015-06-09 MED ORDER — SENNOSIDES-DOCUSATE SODIUM 8.6-50 MG PO TABS
1.0000 | ORAL_TABLET | Freq: Every evening | ORAL | Status: DC | PRN
  Filled 2015-06-09: qty 1

## 2015-06-09 MED ORDER — NEOSTIGMINE METHYLSULFATE 10 MG/10ML IV SOLN
INTRAVENOUS | Status: DC | PRN
  Administered 2015-06-09: 3 mg via INTRAVENOUS

## 2015-06-09 MED ORDER — PROPOFOL 10 MG/ML IV BOLUS
INTRAVENOUS | Status: AC
  Filled 2015-06-09: qty 20

## 2015-06-09 MED ORDER — SODIUM CHLORIDE 0.9 % IJ SOLN: 3.0000 mL | INTRAMUSCULAR | Status: DC | PRN

## 2015-06-09 MED ORDER — MENTHOL 3 MG MT LOZG: 1.0000 | LOZENGE | OROMUCOSAL | Status: DC | PRN

## 2015-06-09 MED ORDER — DIAZEPAM 5 MG PO TABS
5.0000 mg | ORAL_TABLET | Freq: Four times a day (QID) | ORAL | Status: DC | PRN
  Administered 2015-06-09 – 2015-06-10 (×3): 5 mg via ORAL
  Filled 2015-06-09 (×2): qty 1

## 2015-06-09 MED ORDER — METHYLENE BLUE 1 % INJ SOLN
INTRAMUSCULAR | Status: DC | PRN
  Administered 2015-06-09: 1 mL

## 2015-06-09 MED ORDER — LIDOCAINE HCL (CARDIAC) 20 MG/ML IV SOLN
INTRAVENOUS | Status: DC | PRN
  Administered 2015-06-09: 60 mg via INTRAVENOUS

## 2015-06-09 MED ORDER — PANTOPRAZOLE SODIUM 40 MG PO TBEC
40.0000 mg | DELAYED_RELEASE_TABLET | Freq: Every day | ORAL | Status: DC
  Administered 2015-06-10: 40 mg via ORAL
  Filled 2015-06-09: qty 1

## 2015-06-09 MED ORDER — CHLORDIAZEPOXIDE HCL 10 MG PO CAPS: 10.0000 mg | ORAL_CAPSULE | Freq: Every day | ORAL | Status: DC | PRN

## 2015-06-09 MED ORDER — OLOPATADINE HCL 0.1 % OP SOLN
1.0000 [drp] | Freq: Every day | OPHTHALMIC | Status: DC | PRN
  Filled 2015-06-09: qty 5

## 2015-06-09 MED ORDER — THROMBIN 20000 UNITS EX SOLR
CUTANEOUS | Status: AC
  Filled 2015-06-09: qty 20000

## 2015-06-09 MED ORDER — PROGESTERONE MICRONIZED 100 MG PO CAPS: 100.0000 mg | ORAL_CAPSULE | Freq: Every day | ORAL | Status: DC

## 2015-06-09 MED ORDER — SODIUM CHLORIDE 0.9 % IJ SOLN
INTRAMUSCULAR | Status: AC
  Filled 2015-06-09: qty 20

## 2015-06-09 MED ORDER — ALUM & MAG HYDROXIDE-SIMETH 200-200-20 MG/5ML PO SUSP: 30.0000 mL | Freq: Four times a day (QID) | ORAL | Status: DC | PRN

## 2015-06-09 MED ORDER — FLEET ENEMA 7-19 GM/118ML RE ENEM
1.0000 | ENEMA | Freq: Once | RECTAL | Status: AC | PRN
  Filled 2015-06-09: qty 1

## 2015-06-09 MED ORDER — PHENOL 1.4 % MT LIQD: 1.0000 | OROMUCOSAL | Status: DC | PRN

## 2015-06-09 MED ORDER — LIDOCAINE HCL (CARDIAC) 20 MG/ML IV SOLN
INTRAVENOUS | Status: AC
  Filled 2015-06-09: qty 15

## 2015-06-09 MED ORDER — MIDAZOLAM HCL 5 MG/5ML IJ SOLN
INTRAMUSCULAR | Status: DC | PRN
  Administered 2015-06-09: 2 mg via INTRAVENOUS

## 2015-06-09 MED ORDER — GLYCOPYRROLATE 0.2 MG/ML IJ SOLN
INTRAMUSCULAR | Status: AC
  Filled 2015-06-09: qty 3

## 2015-06-09 MED ORDER — FENTANYL CITRATE (PF) 250 MCG/5ML IJ SOLN
INTRAMUSCULAR | Status: DC | PRN
  Administered 2015-06-09 (×5): 50 ug via INTRAVENOUS
  Administered 2015-06-09: 150 ug via INTRAVENOUS
  Administered 2015-06-09 (×2): 50 ug via INTRAVENOUS

## 2015-06-09 MED ORDER — EPHEDRINE SULFATE 50 MG/ML IJ SOLN
INTRAMUSCULAR | Status: AC
  Filled 2015-06-09: qty 2

## 2015-06-09 SURGICAL SUPPLY — 91 items
APL SKNCLS STERI-STRIP NONHPOA (GAUZE/BANDAGES/DRESSINGS) ×1
BENZOIN TINCTURE PRP APPL 2/3 (GAUZE/BANDAGES/DRESSINGS) ×2 IMPLANT
BLADE SURG ROTATE 9660 (MISCELLANEOUS) IMPLANT
BUR PRESCISION 1.7 ELITE (BURR) ×2 IMPLANT
BUR ROUND PRECISION 4.0 (BURR) IMPLANT
BUR ROUND PRECISION 4.0MM (BURR)
BUR SABER RD CUTTING 3.0 (BURR) ×1 IMPLANT
BUR SABER RD CUTTING 3.0MM (BURR) ×1
CAGE CONCORDE BULLET 11X12X27 (Cage) ×6 IMPLANT
CAGE SPNL PRLL BLT NOSE 27X11 (Cage) IMPLANT
CARTRIDGE OIL MAESTRO DRILL (MISCELLANEOUS) ×2 IMPLANT
CLOSURE STERI-STRIP 1/2X4 (GAUZE/BANDAGES/DRESSINGS) ×1
CLOSURE WOUND 1/2 X4 (GAUZE/BANDAGES/DRESSINGS)
CLSR STERI-STRIP ANTIMIC 1/2X4 (GAUZE/BANDAGES/DRESSINGS) ×1 IMPLANT
CONT SPEC STER OR (MISCELLANEOUS) ×3 IMPLANT
COVER SURGICAL LIGHT HANDLE (MISCELLANEOUS) ×3 IMPLANT
DIFFUSER DRILL AIR PNEUMATIC (MISCELLANEOUS) ×6 IMPLANT
DRAIN CHANNEL 15F RND FF W/TCR (WOUND CARE) ×3 IMPLANT
DRAPE C-ARM 42X72 X-RAY (DRAPES) ×5 IMPLANT
DRAPE C-ARMOR (DRAPES) ×3 IMPLANT
DRAPE ORTHO SPLIT 77X108 STRL (DRAPES) ×3
DRAPE POUCH INSTRU U-SHP 10X18 (DRAPES) ×3 IMPLANT
DRAPE SURG 17X23 STRL (DRAPES) ×12 IMPLANT
DRAPE SURG ORHT 6 SPLT 77X108 (DRAPES) ×1 IMPLANT
DRSG MEPILEX BORDER 4X12 (GAUZE/BANDAGES/DRESSINGS) IMPLANT
DRSG MEPILEX BORDER 4X4 (GAUZE/BANDAGES/DRESSINGS) ×2 IMPLANT
DRSG MEPILEX BORDER 4X8 (GAUZE/BANDAGES/DRESSINGS) IMPLANT
DURAPREP 26ML APPLICATOR (WOUND CARE) ×3 IMPLANT
ELECT BLADE 4.0 EZ CLEAN MEGAD (MISCELLANEOUS) ×3
ELECT CAUTERY BLADE 6.4 (BLADE) ×6 IMPLANT
ELECT REM PT RETURN 9FT ADLT (ELECTROSURGICAL) ×3
ELECTRODE BLDE 4.0 EZ CLN MEGD (MISCELLANEOUS) ×1 IMPLANT
ELECTRODE REM PT RTRN 9FT ADLT (ELECTROSURGICAL) ×1 IMPLANT
EVACUATOR SILICONE 100CC (DRAIN) ×3 IMPLANT
GAUZE SPONGE 4X4 12PLY STRL (GAUZE/BANDAGES/DRESSINGS) ×3 IMPLANT
GAUZE SPONGE 4X4 16PLY XRAY LF (GAUZE/BANDAGES/DRESSINGS) ×12 IMPLANT
GLOVE BIO SURGEON STRL SZ7 (GLOVE) ×7 IMPLANT
GLOVE BIO SURGEON STRL SZ8 (GLOVE) ×3 IMPLANT
GLOVE BIOGEL PI IND STRL 7.0 (GLOVE) ×1 IMPLANT
GLOVE BIOGEL PI IND STRL 8 (GLOVE) ×1 IMPLANT
GLOVE BIOGEL PI INDICATOR 7.0 (GLOVE) ×8
GLOVE BIOGEL PI INDICATOR 8 (GLOVE) ×2
GOWN STRL REUS W/ TWL LRG LVL3 (GOWN DISPOSABLE) ×2 IMPLANT
GOWN STRL REUS W/ TWL XL LVL3 (GOWN DISPOSABLE) ×1 IMPLANT
GOWN STRL REUS W/TWL LRG LVL3 (GOWN DISPOSABLE) ×12
GOWN STRL REUS W/TWL XL LVL3 (GOWN DISPOSABLE) ×3
IV CATH 14GX2 1/4 (CATHETERS) ×3 IMPLANT
KIT BASIN OR (CUSTOM PROCEDURE TRAY) ×3 IMPLANT
KIT POSITION SURG JACKSON T1 (MISCELLANEOUS) ×3 IMPLANT
KIT ROOM TURNOVER OR (KITS) ×3 IMPLANT
MARKER SKIN DUAL TIP RULER LAB (MISCELLANEOUS) ×3 IMPLANT
MIX DBX 10CC 35% BONE (Bone Implant) ×4 IMPLANT
NDL HYPO 25GX1X1/2 BEV (NEEDLE) ×1 IMPLANT
NDL SPNL 18GX3.5 QUINCKE PK (NEEDLE) ×2 IMPLANT
NEEDLE BONE MARROW 8GX6 FENEST (NEEDLE) IMPLANT
NEEDLE HYPO 25GX1X1/2 BEV (NEEDLE) ×3 IMPLANT
NEEDLE SPNL 18GX3.5 QUINCKE PK (NEEDLE) ×6 IMPLANT
NEURO MONITORING STIM (LABOR (TRAVEL & OVERTIME)) ×2 IMPLANT
NS IRRIG 1000ML POUR BTL (IV SOLUTION) ×3 IMPLANT
OIL CARTRIDGE MAESTRO DRILL (MISCELLANEOUS) ×6
PACK LAMINECTOMY ORTHO (CUSTOM PROCEDURE TRAY) ×3 IMPLANT
PACK UNIVERSAL I (CUSTOM PROCEDURE TRAY) ×3 IMPLANT
PAD ARMBOARD 7.5X6 YLW CONV (MISCELLANEOUS) ×6 IMPLANT
PATTIES SURGICAL .5 X1 (DISPOSABLE) ×3 IMPLANT
PATTIES SURGICAL .5 X3 (DISPOSABLE) IMPLANT
PATTIES SURGICAL .5X1.5 (GAUZE/BANDAGES/DRESSINGS) ×3 IMPLANT
PATTIES SURGICAL .75X.75 (GAUZE/BANDAGES/DRESSINGS) ×3 IMPLANT
ROD EXPEDIUM PRE BENT 5.5X75 (Rod) ×4 IMPLANT
SCREW SET SINGLE INNER (Screw) ×12 IMPLANT
SCREW VIPER CORT FIX 6X35 (Screw) ×8 IMPLANT
SCREW VIPER CORTICAL FIX 6X40 (Screw) ×4 IMPLANT
SPONGE INTESTINAL PEANUT (DISPOSABLE) IMPLANT
SPONGE SURGIFOAM ABS GEL 100 (HEMOSTASIS) IMPLANT
STRIP CLOSURE SKIN 1/2X4 (GAUZE/BANDAGES/DRESSINGS) IMPLANT
SURGIFLO TRUKIT (HEMOSTASIS) IMPLANT
SUT MNCRL AB 4-0 PS2 18 (SUTURE) ×3 IMPLANT
SUT PROLENE 6 0 C 1 24 (SUTURE) IMPLANT
SUT VIC AB 0 CT1 18XCR BRD 8 (SUTURE) ×2 IMPLANT
SUT VIC AB 0 CT1 8-18 (SUTURE) ×6
SUT VIC AB 1 CT1 18XCR BRD 8 (SUTURE) ×2 IMPLANT
SUT VIC AB 1 CT1 8-18 (SUTURE) ×6
SUT VIC AB 2-0 CT2 18 VCP726D (SUTURE) ×6 IMPLANT
SYR 20CC LL (SYRINGE) ×3 IMPLANT
SYR BULB IRRIGATION 50ML (SYRINGE) ×3 IMPLANT
SYR CONTROL 10ML LL (SYRINGE) ×3 IMPLANT
SYR TB 1ML LUER SLIP (SYRINGE) ×3 IMPLANT
TOWEL OR 17X24 6PK STRL BLUE (TOWEL DISPOSABLE) ×3 IMPLANT
TOWEL OR 17X26 10 PK STRL BLUE (TOWEL DISPOSABLE) ×3 IMPLANT
TRAY FOLEY CATH 16FRSI W/METER (SET/KITS/TRAYS/PACK) ×3 IMPLANT
WATER STERILE IRR 1000ML POUR (IV SOLUTION) ×3 IMPLANT
YANKAUER SUCT BULB TIP NO VENT (SUCTIONS) ×3 IMPLANT

## 2015-06-09 NOTE — Anesthesia Procedure Notes (Signed)
Procedure Name: Intubation Performed by: Mariea Clonts Oxygen Delivery Method: Circle system utilized Preoxygenation: Pre-oxygenation with 100% oxygen Intubation Type: IV induction Ventilation: Mask ventilation without difficulty Laryngoscope Size: Miller and 2 Grade View: Grade II Tube type: Oral Tube size: 7.5 mm Number of attempts: 1 Airway Equipment and Method: LTA kit utilized Placement Confirmation: ETT inserted through vocal cords under direct vision,  positive ETCO2 and breath sounds checked- equal and bilateral Tube secured with: Tape Dental Injury: Teeth and Oropharynx as per pre-operative assessment

## 2015-06-09 NOTE — Progress Notes (Signed)
Utilization review completed.  

## 2015-06-09 NOTE — Anesthesia Preprocedure Evaluation (Signed)
Anesthesia Evaluation  Patient identified by MRN, date of birth, ID band Patient awake    Reviewed: Allergy & Precautions, NPO status , Patient's Chart, lab work & pertinent test results  Airway Mallampati: II  TM Distance: >3 FB Neck ROM: Full    Dental no notable dental hx.    Pulmonary neg pulmonary ROS,  breath sounds clear to auscultation  Pulmonary exam normal       Cardiovascular hypertension, Normal cardiovascular examRhythm:Regular Rate:Normal     Neuro/Psych negative neurological ROS  negative psych ROS   GI/Hepatic Neg liver ROS, hiatal hernia, GERD-  Medicated,  Endo/Other  negative endocrine ROS  Renal/GU negative Renal ROS  negative genitourinary   Musculoskeletal negative musculoskeletal ROS (+)   Abdominal   Peds negative pediatric ROS (+)  Hematology  (+) anemia ,   Anesthesia Other Findings   Reproductive/Obstetrics negative OB ROS                             Anesthesia Physical Anesthesia Plan  ASA: II  Anesthesia Plan: General   Post-op Pain Management:    Induction: Intravenous  Airway Management Planned: Oral ETT  Additional Equipment:   Intra-op Plan:   Post-operative Plan: Extubation in OR  Informed Consent: I have reviewed the patients History and Physical, chart, labs and discussed the procedure including the risks, benefits and alternatives for the proposed anesthesia with the patient or authorized representative who has indicated his/her understanding and acceptance.   Dental advisory given  Plan Discussed with: CRNA and Surgeon  Anesthesia Plan Comments:         Anesthesia Quick Evaluation

## 2015-06-09 NOTE — H&P (Signed)
PREOPERATIVE H&P  Chief Complaint: L > R leg pain  HPI: N Alice [name] is a 64 y.o. female who presents with ongoing pain in the left > R leg for many years  MRI reveals stenosis L3-5 and a spondylolisthesis L3-5  Patient has failed multiple forms of conservative care and continues to have pain (see office notes for additional details regarding the patient's full course of treatment)  Past Medical History  Diagnosis Date  . Hyperlipidemia   . H/O hiatal hernia   . Depression   . Anxiety   . DDD (degenerative disc disease), lumbar   . GERD (gastroesophageal reflux disease)   . Sciatica     getting prednisone inj/epidural injections  . Kidney stone    Past Surgical History  Procedure Laterality Date  . Appendectomy  1959  . Tonsillectomy  1976  . Rotator cuff repair Left 2003  . Elbow surgery Left 2003  . Cystoscopy kidney w/ ureteral guide wire    . Cyst excision Right     foot  . Colonoscopy  2015    dilation of esophagus and polypectomy  . Upper gastrointestinal endoscopy  2015  . Breast biopsy Right     benign cyst  . Eye surgery Right 2012    right-pterigium   History   Social History  . Marital Status: Single    Spouse Name: N/A  . Number of Children: 0  . Years of Education: N/A   Occupational History  . Claims     [employer]   Social History Main Topics  . Smoking status: Never Smoker   . Smokeless tobacco: Never Used  . Alcohol Use: No  . Drug Use: No  . Sexual Activity: Not on file   Other Topics Concern  . Not on file   Social History Narrative   Family History  Problem Relation Age of Onset  . Breast cancer Mother   . Breast cancer Paternal Grandmother   . Aneurysm Father     AAA  . Stroke Father   . Hyperlipidemia Mother     entire family  . Hypertension Mother   . Hypertension Sister   . Hypertension Brother    Allergies  Allergen Reactions  . Bactrim [Sulfamethoxazole-Trimethoprim] Other (See Comments)    Burns  stomach  . Penicillins Other (See Comments)    Burns stomach  . Propoxyphene Hcl Nausea And Vomiting  . Statins Other (See Comments)    Burns stomach   Prior to Admission medications   Medication Sig Start Date End Date Taking? Authorizing Provider  Bepotastine Besilate (BEPREVE) 1.5 % SOLN Place 1 drop into both eyes daily as needed (burning or irritation).   Yes Historical Provider, MD  clonazePAM (KLONOPIN) 1 MG tablet Take 1 mg by mouth at bedtime.   Yes Historical Provider, MD  doxycycline (ORACEA) 40 MG capsule Take 40 mg by mouth See admin instructions. Take 1 capsule (40 mg) by mouth daily for 3 nights for rosacea breakouts   Yes Historical Provider, MD  DULoxetine (CYMBALTA) 30 MG capsule Take 30 mg by mouth daily. 05/13/15  Yes Historical Provider, MD  estradiol (MINIVELLE) 0.05 MG/24HR patch Place 1 patch onto the skin 2 (two) times a week. 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  03/16/15  Yes Historical Provider, MD  metroNIDAZOLE (METROCREAM) 0.75 % cream Apply 1 application  topically 3 (three) times daily as needed (rosacea).  12/02/14  Yes Historical Provider, MD  naproxen sodium (ANAPROX) 220 MG tablet Take 440 mg by mouth at bedtime as needed (pain). ALEVE   Yes Historical Provider, MD  nystatin (MYCOSTATIN) 100000 UNIT/ML suspension Take 5-10 mLs by mouth 4 (four) times daily as needed (thrust from doxycycline). Swish, gargle and swallow   Yes Historical Provider, MD  pantoprazole (PROTONIX) 40 MG tablet Take 1 tablet (40 mg total) by mouth daily. Patient taking differently: Take 40 mg by mouth daily at 3 pm.  08/31/14  Yes Inda Castle, MD  Progesterone 100 MG CAPS Take 100 mg by mouth daily.    Yes Historical Provider, MD  sertraline (ZOLOFT) 50 MG tablet Take 50 mg by mouth daily. 03/03/15  Yes Historical Provider, MD  chlordiazePOXIDE (LIBRIUM)  10 MG capsule Take 10 mg by mouth daily as needed (stomach pain).     Historical Provider, MD  diazepam (VALIUM) 10 MG tablet Take 10 mg by mouth See admin instructions. Take 1 tablet (10 mg) at bedtime the night before the procedure, take 1 tablet (10 mg) one hour before the procedure (may take 1 additional tablet if needed for anxiety) filled 04/18/15 04/18/15   Historical Provider, MD  Polyethyl Glycol-Propyl Glycol (SYSTANE OP) Place 1 drop into both eyes daily as needed (dry eyes).    Historical Provider, MD     All other systems have been reviewed and were otherwise negative with the exception of those mentioned in the HPI and as above.  Physical Exam: Filed Vitals:   06/09/15 0625  BP:   Pulse:   Temp: 97.8 F (36.6 C)    General: Alert, no acute distress Cardiovascular: No pedal edema Respiratory: No cyanosis, no use of accessory musculature Skin: No lesions in the area of chief complaint Neurologic: Sensation intact distally Psychiatric: Patient is competent for consent with normal mood and affect Lymphatic: No axillary or cervical lymphadenopathy   Assessment/Plan: Left > R leg pain Plan for Procedure(s): POSTERIOR LUMBAR DECOMPRESSION AND FUSION 2 LEVEL   Sinclair Ship, MD 06/09/2015 7:01 AM

## 2015-06-09 NOTE — Transfer of Care (Signed)
Immediate Anesthesia Transfer of Care Note  Patient: Alice [name]  Procedure(s) Performed: Procedure(s) with comments: POSTERIOR LUMBAR FUSION 2 LEVEL (Left) - Left sided lumbar 3-4, lumbar 4-5 Transforaminal lumbar interbody fusion with instrumentation and allograft  Patient Location: PACU  Anesthesia Type:General  Level of Consciousness: awake, alert  and oriented  Airway & Oxygen Therapy: Patient Spontanous Breathing and Patient connected to nasal cannula oxygen  Post-op Assessment: Report given to RN and Post -op Vital signs reviewed and stable  Post vital signs: Reviewed and stable  Last Vitals:  Filed Vitals:   06/09/15 0625  BP:   Pulse:   Temp: 03.4 C    Complications: No apparent anesthesia complications

## 2015-06-09 NOTE — Anesthesia Postprocedure Evaluation (Signed)
  Anesthesia Post-op Note  Patient: Alice [name]  Procedure(s) Performed: Procedure(s) (LRB): POSTERIOR LUMBAR FUSION 2 LEVEL (Left)  Patient Location: PACU  Anesthesia Type: General  Level of Consciousness: awake and alert   Airway and Oxygen Therapy: Patient Spontanous Breathing  Post-op Pain: mild  Post-op Assessment: Post-op Vital signs reviewed, Patient's Cardiovascular Status Stable, Respiratory Function Stable, Patent Airway and No signs of Nausea or vomiting  Last Vitals:  Filed Vitals:   06/09/15 1300  BP:   Pulse: 131  Temp:   Resp: 15    Post-op Vital Signs: stable   Complications: No apparent anesthesia complications

## 2015-06-09 NOTE — Evaluation (Signed)
Physical Therapy Evaluation Patient Details Name: Alice [name] MRN: [id number] DOB: Dec 21, 1950 Today's Date: 06/09/2015   History of Present Illness  Patient is a 64 yo female s/p POSTERIOR LUMBAR FUSION 2 LEVEL.  Clinical Impression  Patient demonstrates deficits in functional mobility as indicated below. Will need continued skilled PT to address deficits and maximize function. Will see as indicated and progress as tolerated. Patient states that she will have family assist upon discharge and will be going to her mothers home which is easier to be accessible.     Follow Up Recommendations No PT follow up;Supervision/Assistance - 24 hour    Equipment Recommendations  None recommended by PT    Recommendations for Other Services       Precautions / Restrictions Precautions Precautions: Back Precaution Booklet Issued: Yes (comment) Precaution Comments: verbally reviewed and hand out provided Required Braces or Orthoses: Spinal Brace Spinal Brace: Thoracolumbosacral orthotic;Applied in supine position      Mobility  Bed Mobility Overal bed mobility: Needs Assistance Bed Mobility: Rolling;Sidelying to Sit;Sit to Sidelying Rolling: Min guard Sidelying to sit: Min assist     Sit to sidelying: Min assist General bed mobility comments: VCs for technique, positioning and compliance with precautions. Assist to elevate to EOB and assist for LEs for return to bed.    Transfers Overall transfer level: Needs assistance Equipment used: Rolling walker (2 wheeled) Transfers: Sit to/from Stand Sit to Stand: Min guard         General transfer comment: VCs for hand placement  Ambulation/Gait Ambulation/Gait assistance: Min guard;Min assist Ambulation Distance (Feet): 70 Feet Assistive device: Rolling walker (2 wheeled) Gait Pattern/deviations: Step-through pattern;Decreased stride length;Drifts right/left Gait velocity: decreased Gait velocity interpretation: Below normal speed for  age/gender General Gait Details: modest instability with ambulation one noted LOB but able to self correct with minimal assist. VCs for position with RW.  Stairs            Wheelchair Mobility    Modified Rankin (Stroke Patients Only)       Balance Overall balance assessment: Needs assistance Sitting-balance support: Feet supported Sitting balance-Leahy Scale: Good     Standing balance support: Bilateral upper extremity supported;During functional activity Standing balance-Leahy Scale: Fair                               Pertinent Vitals/Pain Pain Assessment: 0-10 Pain Score: 8  Pain Descriptors / Indicators: Discomfort;Operative site guarding Pain Intervention(s): Limited activity within patient's tolerance;Monitored during session;Repositioned    Home Living Family/patient expects to be discharged to:: Private residence Living Arrangements: Alone (will be going to parents house) Available Help at Discharge: Family Type of Home: House Home Access: Level entry     Home Layout: One level Home Equipment: Environmental consultant - 2 wheels;Cane - single point      Prior Function Level of Independence: Independent               Hand Dominance   Dominant Hand: Right    Extremity/Trunk Assessment   Upper Extremity Assessment: Overall WFL for tasks assessed           Lower Extremity Assessment: Overall WFL for tasks assessed         Communication   Communication: No difficulties  Cognition Arousal/Alertness: Awake/alert Behavior During Therapy: WFL for tasks assessed/performed Overall Cognitive Status: Within Functional Limits for tasks assessed  General Comments General comments (skin integrity, edema, etc.): assisted with donning of TLSO    Exercises        Assessment/Plan    PT Assessment Patient needs continued PT services  PT Diagnosis Difficulty walking;Abnormality of gait;Acute pain   PT Problem List  Decreased strength;Decreased range of motion;Decreased activity tolerance;Decreased balance;Decreased mobility;Decreased knowledge of use of DME;Decreased knowledge of precautions;Pain  PT Treatment Interventions DME instruction;Gait training;Stair training;Functional mobility training;Therapeutic activities;Therapeutic exercise;Balance training;Patient/family education   PT Goals (Current goals can be found in the Care Plan section) Acute Rehab PT Goals Patient Stated Goal: to get rid of pain PT Goal Formulation: With patient/family Time For Goal Achievement: 06/23/15 Potential to Achieve Goals: Good    Frequency Min 5X/week   Barriers to discharge        Co-evaluation               End of Session Equipment Utilized During Treatment: Gait belt;Back brace Activity Tolerance: Patient tolerated treatment well;No increased pain Patient left: in bed;with call bell/phone within reach Nurse Communication: Mobility status         Time: 7209-4709 PT Time Calculation (min) (ACUTE ONLY): 24 min   Charges:   PT Evaluation $Initial PT Evaluation Tier I: 1 Procedure PT Treatments $Gait Training: 8-22 mins   PT G CodesDuncan [name] 06-27-2015, 4:43 PM  Alice [name], [place] DPT  [phone]

## 2015-06-10 ENCOUNTER — Encounter (HOSPITAL_COMMUNITY): Payer: Self-pay | Admitting: Orthopedic Surgery

## 2015-06-10 MED FILL — Heparin Sodium (Porcine) Inj 1000 Unit/ML: INTRAMUSCULAR | Qty: 30 | Status: AC

## 2015-06-10 MED FILL — Sodium Chloride IV Soln 0.9%: INTRAVENOUS | Qty: 2000 | Status: AC

## 2015-06-10 NOTE — Op Note (Signed)
NAMESHONTE, Alice [name] NO.:  [id number]  MEDICAL RECORD NO.:  [id number]  LOCATION:  [id number]                        FACILITY:  [facility]  PHYSICIAN:  Phylliss Bob, MD      DATE OF BIRTH:  [birth year]  DATE OF PROCEDURE:  06/09/2015                              OPERATIVE REPORT   PREOPERATIVE DIAGNOSES: 1. Grade 1 spondylolisthesis L3-4, L4-5. 2. Spinal stenosis at L3-4, L4-5. 3. Left greater than right lumbar radiculopathy.  POSTOPERATIVE DIAGNOSES: 1. Grade 1 spondylolisthesis L3-4, L4-5. 2. Spinal stenosis at L3-4, L4-5. 3. Left greater than right lumbar radiculopathy.  PROCEDURES: 1. Left-sided transforaminal lumbar interbody fusion L3-4, L4-5. 2. Right-sided posterolateral fusion L3-4, L4-5. 3. Lumbar decompression with bilateral partial facetectomy, requiring     more bone removal than that of which is required for the fusion     portion of the procedure L3-4, L4-5. 4. Insertion of interbody device x2 (12 x 27 mm Concorde Bullet cage). 5. Placement of posterior segmental instrumentation L3-L5. 6. Use of local autograft. 7. Use of morselized allograft. 8. Intraoperative use of fluoroscopy.  SURGEON:  Phylliss Bob, MD  ASSISTANT:  Pricilla Holm, PA-C.  ANESTHESIA:  General endotracheal anesthesia.  COMPLICATIONS:  None.  DISPOSITION:  Stable.  ESTIMATED BLOOD LOSS:  200 mL.  INDICATIONS FOR SURGERY:  Briefly, Ms. Loeffler is a very pleasant 64 year old female, who did present to me with ongoing and severe pain in the left greater than right leg.  An MRI did reveal stenosis at both L3-4 and L4-5.  In addition, there was noted to be instability with a spondylolisthesis noted on flexion radiographs both the L3-4 and L4-5 levels.  Given the patient's ongoing pain and lack of improvement with appropriate conservative treatment options, we did discuss proceeding with the procedure reflected above.  The patient did fully understand the risks and  limitations of the procedure as outlined in my preoperative note.  OPERATIVE DETAILS:  On June 09, 2015, the patient was brought to surgery and general endotracheal anesthesia was administered.  The patient was placed prone on a well-padded flat Jackson bed with a spinal frame. Antibiotics were given and a time-out procedure was performed.  The back was prepped and draped.  All bony prominences were meticulously padded. A midline incision was made across the L3-4 and L4-5 intervertebral spaces.  The fascia was incised in the midline.  The paraspinal musculature was bluntly swept laterally on both the right and left sides.  I then suppressed to expose the lamina of L3, L4, and L5.  The L3-4 and L4-5 facet joints were noted to be extremely hypertrophic, and substantial overgrowth of bone was removed.  Using anatomic landmarks in addition to AP and lateral fluoroscopy, I did identify the entry points of the L3, L4, and L5 pedicle screws.  A medial to lateral trajectory was utilized.  I then used a 1.7 mm Albino to prepare the trajectory of the pedicle screws.  I then used a 4.35 mm tap, and did tap up to a 6 mm diameter tap bilaterally from L3-L5.  On the right side, the posterolateral gutter was identified and decorticated and  DBX mix was packed into the posterior elements and posterolateral gutter.  I then placed 6 mm screws of the appropriate length from L3-L5 on the right side.  A 75 mm rod was secured into the Tulip heads of the screws.  Caps were placed and distraction was applied across the L3-4 and L4-5 intervertebral spaces.  At this point, I did place bone wax and the cannulated pedicles on the left.  I then performed a central and bilateral lateral recess decompression, removing compression of the traversing L4 and L5 nerves at both levels.  I was very pleased with the decompression.  A full facetectomy was then performed on the left side at L3-4 and at L4-5.  With an assistant  holding medial retraction of the traversing L5 nerve, an annulotomy was made on the left side at the L4-5 intervertebral space.  I then used a series of curettes to perform a thorough and complete intervertebral diskectomy.  The intervertebral space was then packed with allograft as well as autograft, as was the appropriate size intervertebral spacer.  The spacer was then packed with DBX mix and tamped into position in the usual fashion using AP and lateral fluoroscopy.  I was very pleased with the press fit of the implant.  I then turned my attention to the L3-4 intervertebral space. Again, a full facetectomy was performed, and once again, with an assistant holding medial retraction, a thorough and complete intervertebral diskectomy was performed.  The endplates were then prepared, and then the intervertebral space was packed with autograft and allograft, as was the appropriate size intervertebral spacer, which was tamped into position using AP and lateral fluoroscopy.  I then removed the distraction on the contralateral right side.  I then placed the appropriate length screws from L3-L5 on the left.  Again, a 75 mm rod was placed.  Caps were placed and a final locking procedure was performed on both the right and on the left sides.  I was extremely pleased with the AP and lateral fluoroscopic images.  Of note, I did use triggered EMG to test each of the screws, and there was no screw that tested below 15 milliamps.  The wound was then copiously irrigated with approximately 2 L of normal saline.  Of note, I did use neurologic monitoring throughout the surgery, and there was no abnormal EMG activity noted throughout the entire surgery.  The wound was then closed in layers using #1 Vicryl followed by 2-0 Vicryl, followed by 3-0 Monocryl.  Benzoin and Steri-Strips were applied followed by sterile dressing.  All instrument counts were correct at the termination of the procedure.  Of note,  there was no extravasation of cerebrospinal fluid and there was no significant bleeding at the termination of the procedure.  Of note, Pricilla Holm was my assistant throughout surgery, and did aid in retraction, suctioning, and closure throughout the surgery from start to finish.     Phylliss Bob, MD     MD/MEDQ  D:  06/09/2015  T:  06/10/2015  Job:  [id number]  cc:   Azalia Bilis, M.D.

## 2015-06-10 NOTE — Progress Notes (Signed)
Physical Therapy Treatment Patient Details Name: Alice [name] MRN: [id number] DOB: Mar 18, 1951 Today's Date: 06/10/2015    History of Present Illness Patient is a 64 yo female s/p POSTERIOR LUMBAR FUSION 2 LEVEL.    PT Comments    Patient mobilizing well. Patient tolerated increased ambulation with AND without RW. Performed stair negotiation with supervision. Patient with good compliance of precautions during mobility. Educated patient regarding mobility expectations, safety, car transfers, and pain management techniques. Anticipate patient will be safe for d/c home.    Follow Up Recommendations  No PT follow up;Supervision/Assistance - 24 hour     Equipment Recommendations  None recommended by PT    Recommendations for Other Services       Precautions / Restrictions Precautions Precautions: Back Precaution Booklet Issued: Yes (comment) Precaution Comments: verbally reviewed and hand out provided Required Braces or Orthoses: Spinal Brace Spinal Brace: Thoracolumbosacral orthotic;Applied in sitting position Restrictions Weight Bearing Restrictions: No    Mobility  Bed Mobility Overal bed mobility: Needs Assistance Bed Mobility: Rolling;Sidelying to Sit Rolling: Modified independent (Device/Increase time) Sidelying to sit: Supervision       General bed mobility comments: good compliance with technique, performed with bed flat, no physical assisst required  Transfers Overall transfer level: Needs assistance Equipment used: Rolling walker (2 wheeled) Transfers: Sit to/from Stand Sit to Stand: Supervision         General transfer comment: VCs for hand placement coming to standing with RW  Ambulation/Gait Ambulation/Gait assistance: Supervision Ambulation Distance (Feet): 240 Feet Assistive device: Rolling walker (2 wheeled) (80 ft without device)   Gait velocity: decreased Gait velocity interpretation: Below normal speed for age/gender General Gait Details: VCs  for increased cadence, able to ambulate without RW   Stairs Stairs: Yes Stairs assistance: Supervision Stair Management: One rail Right;Step to pattern;Forwards Number of Stairs: 7 General stair comments: no physical assist, cues for sequencing  Wheelchair Mobility    Modified Rankin (Stroke Patients Only)       Balance   Sitting-balance support: Feet supported Sitting balance-Leahy Scale: Good     Standing balance support: No upper extremity supported Standing balance-Leahy Scale: Fair                      Cognition Arousal/Alertness: Awake/alert Behavior During Therapy: WFL for tasks assessed/performed Overall Cognitive Status: Within Functional Limits for tasks assessed                      Exercises      General Comments        Pertinent Vitals/Pain Pain Assessment: 0-10 Pain Score: 6  Pain Location: back Pain Descriptors / Indicators: Sore Pain Intervention(s): Monitored during session;Repositioned;Relaxation    Home Living                      Prior Function            PT Goals (current goals can now be found in the care plan section) Acute Rehab PT Goals Patient Stated Goal: to get rid of pain PT Goal Formulation: With patient/family Time For Goal Achievement: 06/23/15 Potential to Achieve Goals: Good Progress towards PT goals: Progressing toward goals    Frequency  Min 5X/week    PT Plan Current plan remains appropriate    Co-evaluation             End of Session Equipment Utilized During Treatment: Gait belt;Back brace Activity Tolerance: Patient tolerated treatment well;No  increased pain Patient left: in bed;with call bell/phone within reach     Time: 0740-0759 PT Time Calculation (min) (ACUTE ONLY): 19 min  Charges:  $Gait Training: 8-22 mins                    G CodesDuncan Dull 06-29-15, 8:04 AM Alben Deeds, PT DPT  [phone]

## 2015-06-10 NOTE — Progress Notes (Signed)
Occupational Therapy Evaluation Patient Details Name: Alice [name] MRN: [id number] DOB: Apr 30, 1951 Today's Date: 06/10/2015    History of Present Illness Patient is a 64 yo female s/p POSTERIOR LUMBAR FUSION 2 LEVEL.   Clinical Impression   Completed all education regarding back precautions for ADL and functional mobility for ADL. Pt able ot return demonstrate. Pt ready to D/C home with assistance of family.     Follow Up Recommendations  No OT follow up;Supervision - Intermittent    Equipment Recommendations  None recommended by OT    Recommendations for Other Services       Precautions / Restrictions Precautions Precautions: Back Precaution Booklet Issued: Yes (comment) Precaution Comments: handout reviewed Required Braces or Orthoses: Spinal Brace Spinal Brace: Thoracolumbosacral orthotic;Applied in supine position Restrictions Weight Bearing Restrictions: No      Mobility Bed Mobility Overal bed mobility: Modified Independent             General bed mobility comments: good carry over from earlier session  Transfers Overall transfer level: Modified independent                    Balance Overall balance assessment: No apparent balance deficits (not formally assessed)                                          ADL Overall ADL's : Needs assistance/impaired     Grooming: Supervision/safety   Upper Body Bathing: Set up;Supervision/ safety   Lower Body Bathing: Supervison/ safety;Set up;Sit to/from stand   Upper Body Dressing : Set up;Supervision/safety   Lower Body Dressing: Supervision/safety;Set up;Sit to/from stand   Toilet Transfer: Supervision/safety;Ambulation   Toileting- Clothing Manipulation and Hygiene: Minimal assistance;Sit to/from Nurse, children's Details (indicate cue type and reason): educated on shower transfer Functional mobility during ADLs: Supervision/safety General ADL Comments: Completed  education regarding compensatory techniques for ADL and functional mobility for ADL adhering to back precautions. Pt educated on donning/doffing TLSO. Able to return demonstrte with min A. States her Mom will assist with her TLSO. Educated on home safety and reducing risk of falls. Assisted pt with ADL to use Teach back for educaiton. additional informatioin provided on handout. Pt verbalized understanding with all information.                      Pertinent Vitals/Pain Pain Assessment: 0-10 Pain Score: 5  Pain Location: back Pain Descriptors / Indicators: Aching;Sore;Tender Pain Intervention(s): Limited activity within patient's tolerance;Monitored during session;Repositioned     Hand Dominance Right   Extremity/Trunk Assessment Upper Extremity Assessment Upper Extremity Assessment: Overall WFL for tasks assessed   Lower Extremity Assessment Lower Extremity Assessment: Overall WFL for tasks assessed   Cervical / Trunk Assessment Cervical / Trunk Assessment: Normal   Communication Communication Communication: No difficulties   Cognition Arousal/Alertness: Awake/alert Behavior During Therapy: WFL for tasks assessed/performed Overall Cognitive Status: Within Functional Limits for tasks assessed                                        Home Living Family/patient expects to be discharged to:: Private residence Living Arrangements: Alone (will be going to parents house) Available Help at Discharge: Family Type of Home: House Home Access: Level entry  Home Layout: One level     Bathroom Shower/Tub: Occupational psychologist: Handicapped height Bathroom Accessibility: Yes How Accessible: Accessible via walker Home Equipment: [place] - 2 wheels;Cane - single point;Bedside commode   Additional Comments: plans to live at her mother's house      Prior Functioning/Environment Level of Independence: Independent             OT Diagnosis:  Generalized weakness;Acute pain   OT Problem List: Decreased activity tolerance;Decreased knowledge of use of DME or AE;Decreased knowledge of precautions;Pain   OT Treatment/Interventions:      OT Goals(Current goals can be found in the care plan section) Acute Rehab OT Goals Patient Stated Goal: to go home OT Goal Formulation: All assessment and education complete, DC therapy  OT Frequency:     Barriers to D/C:            Co-evaluation              End of Session Equipment Utilized During Treatment: Back brace Nurse Communication: Mobility status  Activity Tolerance: Patient tolerated treatment well Patient left: in chair;with call bell/phone within reach   Time: 0950-1030 OT Time Calculation (min): 40 min Charges:  OT General Charges $OT Visit: 1 Procedure OT Evaluation $Initial OT Evaluation Tier I: 1 Procedure OT Treatments $Self Care/Home Management : 23-37 mins G-Codes:    Kem Hensen,HILLARY 06/25/15, 11:43 AM   Maurie Boettcher, OTR/L  [phone] Jun 25, 2015

## 2015-06-10 NOTE — Progress Notes (Signed)
    Patient doing well Patient denies leg pain   Physical Exam: Filed Vitals:   06/10/15 0400  BP: 91/50  Pulse: 77  Temp: 98.4 F (36.9 C)  Resp: 18    Dressing in place NVI  POD #1 s/p L3-5 decompression and fusion, doing very well  - up with PT/OT, encourage ambulation - Percocet for pain, Valium for muscle spasms - likely d/c home today, pt comfortable with this plan

## 2015-06-29 NOTE — Discharge Summary (Signed)
Patient ID: Alice [name] MRN: [id number] DOB/AGE: 66-Aug-1952 66 y.o.  Admit date: 06/09/2015 Discharge date: 06/10/2015  Admission Diagnoses:  Active Problems:   Spinal stenosis   Discharge Diagnoses:  Same  Past Medical History  Diagnosis Date  . Hyperlipidemia   . H/O hiatal hernia   . Depression   . Anxiety   . DDD (degenerative disc disease), lumbar   . GERD (gastroesophageal reflux disease)   . Sciatica     getting prednisone inj/epidural injections  . Kidney stone     Surgeries: Procedure(s): POSTERIOR LUMBAR FUSION 2 LEVEL L3-5 on 06/09/2015   Consultants:  None  Discharged Condition: Improved  Hospital Course: Alice [name] is an 64 y.o. female who was admitted 06/09/2015 for operative treatment of radiculopathy. Patient has severe unremitting pain that affects sleep, daily activities, and work/hobbies. After pre-op clearance the patient was taken to the operating room on 06/09/2015 and underwent  Procedure(s): POSTERIOR LUMBAR FUSION 2 LEVEL L3-5.    Patient was given perioperative antibiotics:  Anti-infectives    Start     Dose/Rate Route Frequency Ordered Stop   06/09/15 1930  vancomycin (VANCOCIN) IVPB 1000 mg/200 mL premix     1,000 mg 200 mL/hr over 60 Minutes Intravenous  Once 06/09/15 1522 06/09/15 1930   06/09/15 0615  vancomycin (VANCOCIN) IVPB 1000 mg/200 mL premix     1,000 mg 200 mL/hr over 60 Minutes Intravenous To ShortStay Surgical 06/08/15 2324 06/09/15 0715       Patient was given sequential compression devices, early ambulation to prevent DVT.  Patient benefited maximally from hospital stay and there were no complications.    Recent vital signs: BP 91/50 mmHg  Pulse 72  Temp(Src) 98.9 F (37.2 C) (Oral)  Resp 18  Wt 69.854 kg (154 lb)  SpO2 98%  Discharge Medications:     Medication List    TAKE these medications        BEPREVE 1.5 % Soln  Generic drug:  Bepotastine Besilate  Place 1 drop into both eyes daily as needed  (burning or irritation).     chlordiazePOXIDE 10 MG capsule  Commonly known as:  LIBRIUM  Take 10 mg by mouth daily as needed (stomach pain).     clonazePAM 1 MG tablet  Commonly known as:  KLONOPIN  Take 1 mg by mouth at bedtime.     diazepam 10 MG tablet  Commonly known as:  VALIUM  Take 10 mg by mouth See admin instructions. Take 1 tablet (10 mg) at bedtime the night before the procedure, take 1 tablet (10 mg) one hour before the procedure (may take 1 additional tablet if needed for anxiety) filled 04/18/15     doxycycline 40 MG capsule  Commonly known as:  ORACEA  Take 40 mg by mouth See admin instructions. Take 1 capsule (40 mg) by mouth daily for 3 nights for rosacea breakouts     DULoxetine 30 MG capsule  Commonly known as:  CYMBALTA  Take 30 mg by mouth daily.     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     metroNIDAZOLE 0.75 % cream  Commonly known as:  METROCREAM  Apply 1 application topically 3 (three) times daily as needed (rosacea).     MINIVELLE 0.05 MG/24HR patch  Generic drug:  estradiol  Place 1 patch onto the skin 2 (two) times a week. Wednesdays  and Sundays     nystatin 100000 UNIT/ML suspension  Commonly known as:  MYCOSTATIN  Take 5-10 mLs by mouth 4 (four) times daily as needed (thrust from doxycycline). Swish, gargle and swallow     pantoprazole 40 MG tablet  Commonly known as:  PROTONIX  Take 1 tablet (40 mg total) by mouth daily.     Progesterone 100 MG Caps  Take 100 mg by mouth daily.     sertraline 50 MG tablet  Commonly known as:  ZOLOFT  Take 50 mg by mouth daily.     SYSTANE OP  Place 1 drop into both eyes daily as needed (dry eyes).        Diagnostic Studies: Dg Chest 2 View  05/31/2015   CLINICAL DATA:  Pre operative respiratory exam. Degenerative lumbar disc disease.  EXAM: CHEST  2 VIEW  COMPARISON:  None.  FINDINGS: The heart size  and mediastinal contours are within normal limits. Both lungs are clear. The visualized skeletal structures are unremarkable.  IMPRESSION: No active cardiopulmonary disease.   Electronically Signed   By: Lorriane Shire M.D.   On: 05/31/2015 16:15   Dg Lumbar Spine 2-3 Views  06/09/2015   CLINICAL DATA:  64 year old female post fusion L3-L5. Subsequent encounter.  EXAM: LUMBAR SPINE - 2-3 VIEW; DG C-ARM 61-120 MIN  COMPARISON:  Intraoperative lateral view of the lumbar spine 06/09/2015 and preoperative postmyelogram CT 02/18/2015.  Fluoroscopic time: 34 seconds. 2 C-arm images obtained and submitted for review after surgery.  FINDINGS: Fusion L3 through L5 with bilateral pedicle screws and posterior connecting bar and interbody spacers in place. No obvious complication on the C-arm images. This can be assessed on follow up plain film exam.  IMPRESSION: Fusion L3-L5 as noted above.   Electronically Signed   By: Genia Del M.D.   On: 06/09/2015 12:07   Dg Lumbar Spine 1 View  06/09/2015   CLINICAL DATA:  Spinal stenosis, intraoperative localization film  EXAM: LUMBAR SPINE - 1 VIEW  COMPARISON:  CT lumbar spine post myelography dated 02/18/2015  FINDINGS: A lateral cross-table portable view from the operating room shows two needles positioned posteriorly for localization. The more cephalad needle is directed toward the inferior spinous process of L2, with the more caudal needle between the spinous processes posteriorly of L4 and L5.  IMPRESSION: Needles for localization as noted above.   Electronically Signed   By: Ivar Drape M.D.   On: 06/09/2015 08:25   Dg C-arm 61-120 Min  06/09/2015   CLINICAL DATA:  64 year old female post fusion L3-L5. Subsequent encounter.  EXAM: LUMBAR SPINE - 2-3 VIEW; DG C-ARM 61-120 MIN  COMPARISON:  Intraoperative lateral view of the lumbar spine 06/09/2015 and preoperative postmyelogram CT 02/18/2015.  Fluoroscopic time: 34 seconds. 2 C-arm images obtained and submitted for  review after surgery.  FINDINGS: Fusion L3 through L5 with bilateral pedicle screws and posterior connecting bar and interbody spacers in place. No obvious complication on the C-arm images. This can be assessed on follow up plain film exam.  IMPRESSION: Fusion L3-L5 as noted above.   Electronically Signed   By: Genia Del M.D.   On: 06/09/2015 12:07    Disposition: 01-Home or Self Care   POD #1 s/p L3-5 decompression and fusion, doing very well  - up with PT/OT, encourage ambulation - Percocet for pain, Valium for muscle spasms -Written scripts for pain signed and in chart -D/C instructions sheet printed and in chart -D/C today  -F/U in  office 2 weeks   Signed: Justice Britain 06/29/2015, 2:17 PM

## 2015-08-11 ENCOUNTER — Other Ambulatory Visit: Payer: Self-pay | Admitting: Orthopedic Surgery

## 2015-08-11 DIAGNOSIS — M5430 Sciatica, unspecified side: Secondary | ICD-10-CM

## 2015-08-15 ENCOUNTER — Ambulatory Visit
Admission: RE | Admit: 2015-08-15 | Discharge: 2015-08-15 | Disposition: A | Discharge status: Home / Self Care | Payer: BLUE CROSS/BLUE SHIELD | Source: Ambulatory Visit | Attending: Orthopedic Surgery | Admitting: Orthopedic Surgery

## 2015-08-15 DIAGNOSIS — M5430 Sciatica, unspecified side: Secondary | ICD-10-CM

## 2015-09-15 ENCOUNTER — Telehealth: Payer: Self-pay | Admitting: Internal Medicine

## 2015-09-15 NOTE — Telephone Encounter (Signed)
Yes - we have done this I think - she asked today and PJ and Yesi took care of it

## 2015-09-19 DIAGNOSIS — N7011 Chronic salpingitis: Secondary | ICD-10-CM | POA: Insufficient documentation

## 2015-10-22 ENCOUNTER — Ambulatory Visit: Payer: Self-pay | Admitting: Orthopedic Surgery

## 2015-10-22 NOTE — Progress Notes (Signed)
Preoperative surgical orders have been place into the Epic hospital system for Alice [name] on 10/22/2015, 4:12 PM  by Mickel Crow for surgery on 11-07-2015.  Preop Hip orders including Experel Injecion, IV Tylenol, and IV Decadron as long as there are no contraindications to the above medications. Arlee Muslim, PA-C

## 2015-11-01 ENCOUNTER — Encounter (HOSPITAL_COMMUNITY)
Admission: RE | Admit: 2015-11-01 | Discharge: 2015-11-01 | Disposition: A | Discharge status: Home / Self Care | Payer: BLUE CROSS/BLUE SHIELD | Source: Ambulatory Visit | Attending: Orthopedic Surgery | Admitting: Orthopedic Surgery

## 2015-11-01 ENCOUNTER — Encounter (HOSPITAL_COMMUNITY): Payer: Self-pay

## 2015-11-01 DIAGNOSIS — Z01818 Encounter for other preprocedural examination: Secondary | ICD-10-CM | POA: Diagnosis not present

## 2015-11-01 HISTORY — DX: Anorexia: R63.0

## 2015-11-01 HISTORY — DX: Malignant (primary) neoplasm, unspecified: C80.1

## 2015-11-01 HISTORY — DX: Tinnitus, unspecified ear: H93.19

## 2015-11-01 HISTORY — DX: Personal history of other diseases of the digestive system: Z87.19

## 2015-11-01 HISTORY — DX: Personal history of peptic ulcer disease: Z87.11

## 2015-11-01 LAB — BASIC METABOLIC PANEL
Anion gap: 5 (ref 5–15)
BUN: 17 mg/dL (ref 6–20)
CALCIUM: 9.8 mg/dL (ref 8.9–10.3)
CO2: 27 mmol/L (ref 22–32)
CREATININE: 0.89 mg/dL (ref 0.44–1.00)
Chloride: 110 mmol/L (ref 101–111)
GFR calc non Af Amer: >60 mL/min (ref >60)
Glucose, Bld: 85 mg/dL (ref 65–99)
Potassium: 5 mmol/L (ref 3.5–5.1)
Sodium: 142 mmol/L (ref 135–145)

## 2015-11-01 LAB — CBC
HEMATOCRIT: 37.8 % (ref 36.0–46.0)
Hemoglobin: 12 g/dL (ref 12.0–15.0)
MCH: 25.4 pg — AB (ref 26.0–34.0)
MCHC: 31.7 g/dL (ref 30.0–36.0)
MCV: 80.1 fL (ref 78.0–100.0)
Platelets: 190 10*3/uL (ref 150–400)
RBC: 4.72 MIL/uL (ref 3.87–5.11)
RDW: 17.3 % — AB (ref 11.5–15.5)
WBC: 7.5 10*3/uL (ref 4.0–10.5)

## 2015-11-01 NOTE — Progress Notes (Signed)
EKG epic 05/31/2015 CXR epic 05/31/2015

## 2015-11-01 NOTE — Patient Instructions (Signed)
Alice [name]  11/01/2015   Your procedure is scheduled on: Monday November 07, 2015  Report to [facility]  Entrance take [place]  elevators to 3rd floor to  [place] at 1:00 PM.  Call this number if you have problems the morning of surgery [phone]   Remember: ONLY 1 PERSON MAY GO WITH YOU TO SHORT STAY TO GET  READY MORNING OF [place].  Do not eat food After Midnight but may take clear liquids till 10:00 am day of surgery then nothing by mouth.      Take these medicines the morning of surgery with A SIP OF WATER: Diazepam (Valium) if needed; Hydrocodone-Acetaminophen if needed; Pantoprazole (Protonix); May use eye drops if needed (bring bottles day of surgery)                               You may not have any metal on your body including hair pins and              piercings  Do not wear jewelry, make-up, lotions, powders or perfumes, deodorant             Do not wear nail polish.  Do not shave  48 hours prior to surgery.               Do not bring valuables to the hospital. [place].  Contacts, dentures or bridgework may not be worn into surgery.  Leave suitcase in the car. After surgery it may be brought to your room.                Please read over the following fact sheets you were given:INCENTIVE SPIROMETER  _____________________________________________________________________             [facility] - Preparing for Surgery Before surgery, you can play an important role.  Because skin is not sterile, your skin needs to be as free of germs as possible.  You can reduce the number of germs on your skin by washing with CHG (chlorahexidine gluconate) soap before surgery.  CHG is an antiseptic cleaner which kills germs and bonds with the skin to continue killing germs even after washing. Please DO NOT use if you have an allergy to CHG or antibacterial soaps.  If your skin becomes  reddened/irritated stop using the CHG and inform your nurse when you arrive at Short Stay. Do not shave (including legs and underarms) for at least 48 hours prior to the first CHG shower.  You may shave your face/neck. Please follow these instructions carefully:  1.  Shower with CHG Soap the night before surgery and the  morning of Surgery.  2.  If you choose to wash your hair, wash your hair first as usual with your  normal  shampoo.  3.  After you shampoo, rinse your hair and body thoroughly to remove the  shampoo.                           4.  Use CHG as you would any other liquid soap.  You can apply chg directly  to the skin and wash  Gently with a scrungie or clean washcloth.  5.  Apply the CHG Soap to your body ONLY FROM THE NECK DOWN.   Do not use on face/ open                           Wound or open sores. Avoid contact with eyes, ears mouth and genitals (private parts).                       Wash face,  Genitals (private parts) with your normal soap.             6.  Wash thoroughly, paying special attention to the area where your surgery  will be performed.  7.  Thoroughly rinse your body with warm water from the neck down.  8.  DO NOT shower/wash with your normal soap after using and rinsing off  the CHG Soap.                9.  Pat yourself dry with a clean towel.            10.  Wear clean pajamas.            11.  Place clean sheets on your bed the night of your first shower and do not  sleep with pets. Day of Surgery : Do not apply any lotions/deodorants the morning of surgery.  Please wear clean clothes to the hospital/surgery center.  FAILURE TO FOLLOW THESE INSTRUCTIONS MAY RESULT IN THE CANCELLATION OF YOUR SURGERY PATIENT SIGNATURE_________________________________  NURSE SIGNATURE__________________________________  ________________________________________________________________________    CLEAR LIQUID DIET   Foods Allowed                                                                      Foods Excluded  Coffee and tea, regular and decaf                             liquids that you cannot  Plain Jell-O in any flavor                                             see through such as: Fruit ices (not with fruit pulp)                                     milk, soups, orange juice  Iced Popsicles                                    All solid food Carbonated beverages, regular and diet                                    Cranberry, grape and apple juices Sports drinks like Gatorade Lightly seasoned clear broth or consume(fat free) Sugar, honey syrup  Sample Menu Breakfast                                Lunch                                     Supper Cranberry juice                    Beef broth                            Chicken broth Jell-O                                     Grape juice                           Apple juice Coffee or tea                        Jell-O                                      Popsicle                                                Coffee or tea                        Coffee or tea  _____________________________________________________________________    Incentive Spirometer  An incentive spirometer is a tool that can help keep your lungs clear and active. This tool measures how well you are filling your lungs with each breath. Taking long deep breaths may help reverse or decrease the chance of developing breathing (pulmonary) problems (especially infection) following:  A long period of time when you are unable to move or be active. BEFORE THE PROCEDURE   If the spirometer includes an indicator to show your best effort, your nurse or respiratory therapist will set it to a desired goal.  If possible, sit up straight or lean slightly forward. Try not to slouch.  Hold the incentive spirometer in an upright position. INSTRUCTIONS FOR USE  1. Sit on the edge of your bed if possible, or sit up as far as you  can in bed or on a chair. 2. Hold the incentive spirometer in an upright position. 3. Breathe out normally. 4. Place the mouthpiece in your mouth and seal your lips tightly around it. 5. Breathe in slowly and as deeply as possible, raising the piston or the ball toward the top of the column. 6. Hold your breath for 3-5 seconds or for as long as possible. Allow the piston or ball to fall to the bottom of the column. 7. Remove the mouthpiece from your mouth and breathe out normally. 8. Rest for a few seconds and repeat Steps 1 through 7 at least 10 times every 1-2 hours when you are awake. Take your time and take a few normal breaths between  deep breaths. 9. The spirometer may include an indicator to show your best effort. Use the indicator as a goal to work toward during each repetition. 10. After each set of 10 deep breaths, practice coughing to be sure your lungs are clear. If you have an incision (the cut made at the time of surgery), support your incision when coughing by placing a pillow or rolled up towels firmly against it. Once you are able to get out of bed, walk around indoors and cough well. You may stop using the incentive spirometer when instructed by your caregiver.  RISKS AND COMPLICATIONS  Take your time so you do not get dizzy or light-headed.  If you are in pain, you may need to take or ask for pain medication before doing incentive spirometry. It is harder to take a deep breath if you are having pain. AFTER USE  Rest and breathe slowly and easily.  It can be helpful to keep track of a log of your progress. Your caregiver can provide you with a simple table to help with this. If you are using the spirometer at home, follow these instructions: [place] IF:   You are having difficultly using the spirometer.  You have trouble using the spirometer as often as instructed.  Your pain medication is not giving enough relief while using the spirometer.  You develop  fever of 100.5 F (38.1 C) or higher. SEEK IMMEDIATE MEDICAL CARE IF:   You cough up bloody sputum that had not been present before.  You develop fever of 102 F (38.9 C) or greater.  You develop worsening pain at or near the incision site. MAKE SURE YOU:   Understand these instructions.  Will watch your condition.  Will get help right away if you are not doing well or get worse. Document Released: 04/01/2007 Document Revised: 02/11/2012 Document Reviewed: 06/02/2007 [employer] Patient Information 2014 [place], [place].   ________________________________________________________________________

## 2015-11-02 NOTE — Progress Notes (Signed)
Pt aware of surgical time change. Pt also aware clear liquids till 8:45 am day of surgery then nothing by mouth.

## 2015-11-07 ENCOUNTER — Ambulatory Visit (HOSPITAL_COMMUNITY): Payer: BLUE CROSS/BLUE SHIELD | Admitting: Anesthesiology

## 2015-11-07 ENCOUNTER — Encounter (HOSPITAL_COMMUNITY): Payer: Self-pay | Admitting: *Deleted

## 2015-11-07 ENCOUNTER — Observation Stay (HOSPITAL_COMMUNITY)
Admission: RE | Admit: 2015-11-07 | Discharge: 2015-11-08 | Disposition: A | Discharge status: Home / Self Care | Payer: BLUE CROSS/BLUE SHIELD | Source: Ambulatory Visit | Attending: Orthopedic Surgery | Admitting: Orthopedic Surgery

## 2015-11-07 ENCOUNTER — Encounter (HOSPITAL_COMMUNITY)
Admission: RE | Disposition: A | Discharge status: Home / Self Care | Payer: Self-pay | Source: Ambulatory Visit | Attending: Orthopedic Surgery

## 2015-11-07 DIAGNOSIS — Z981 Arthrodesis status: Secondary | ICD-10-CM | POA: Insufficient documentation

## 2015-11-07 DIAGNOSIS — F329 Major depressive disorder, single episode, unspecified: Secondary | ICD-10-CM | POA: Diagnosis not present

## 2015-11-07 DIAGNOSIS — K449 Diaphragmatic hernia without obstruction or gangrene: Secondary | ICD-10-CM | POA: Diagnosis not present

## 2015-11-07 DIAGNOSIS — E785 Hyperlipidemia, unspecified: Secondary | ICD-10-CM | POA: Insufficient documentation

## 2015-11-07 DIAGNOSIS — I10 Essential (primary) hypertension: Secondary | ICD-10-CM | POA: Insufficient documentation

## 2015-11-07 DIAGNOSIS — M7062 Trochanteric bursitis, left hip: Secondary | ICD-10-CM | POA: Diagnosis not present

## 2015-11-07 DIAGNOSIS — X58XXXA Exposure to other specified factors, initial encounter: Secondary | ICD-10-CM | POA: Diagnosis not present

## 2015-11-07 DIAGNOSIS — Z79899 Other long term (current) drug therapy: Secondary | ICD-10-CM | POA: Insufficient documentation

## 2015-11-07 DIAGNOSIS — K219 Gastro-esophageal reflux disease without esophagitis: Secondary | ICD-10-CM | POA: Diagnosis not present

## 2015-11-07 DIAGNOSIS — Z791 Long term (current) use of non-steroidal anti-inflammatories (NSAID): Secondary | ICD-10-CM | POA: Insufficient documentation

## 2015-11-07 DIAGNOSIS — F419 Anxiety disorder, unspecified: Secondary | ICD-10-CM | POA: Diagnosis not present

## 2015-11-07 DIAGNOSIS — S76012A Strain of muscle, fascia and tendon of left hip, initial encounter: Secondary | ICD-10-CM | POA: Insufficient documentation

## 2015-11-07 DIAGNOSIS — Z85828 Personal history of other malignant neoplasm of skin: Secondary | ICD-10-CM | POA: Diagnosis not present

## 2015-11-07 DIAGNOSIS — M25552 Pain in left hip: Secondary | ICD-10-CM | POA: Diagnosis present

## 2015-11-07 HISTORY — PX: EXCISION/RELEASE BURSA HIP: SHX5014

## 2015-11-07 SURGERY — RELEASE, BURSA, TROCHANTERIC
Anesthesia: General | Site: Hip | Laterality: Left

## 2015-11-07 MED ORDER — PROGESTERONE MICRONIZED 100 MG PO CAPS: 100.0000 mg | ORAL_CAPSULE | Freq: Every day | ORAL | Status: DC

## 2015-11-07 MED ORDER — DEXAMETHASONE SODIUM PHOSPHATE 10 MG/ML IJ SOLN
INTRAMUSCULAR | Status: AC
  Filled 2015-11-07: qty 1

## 2015-11-07 MED ORDER — CHLORHEXIDINE GLUCONATE 4 % EX LIQD: 60.0000 mL | Freq: Once | CUTANEOUS | Status: DC

## 2015-11-07 MED ORDER — ENOXAPARIN SODIUM 40 MG/0.4ML ~~LOC~~ SOLN
40.0000 mg | SUBCUTANEOUS | Status: DC
  Administered 2015-11-08: 40 mg via SUBCUTANEOUS
  Filled 2015-11-07 (×2): qty 0.4

## 2015-11-07 MED ORDER — DIAZEPAM 5 MG PO TABS: 5.0000 mg | ORAL_TABLET | Freq: Four times a day (QID) | ORAL | Status: DC | PRN

## 2015-11-07 MED ORDER — LIDOCAINE HCL (CARDIAC) 20 MG/ML IV SOLN
INTRAVENOUS | Status: DC | PRN
  Administered 2015-11-07: 30 mg via INTRAVENOUS

## 2015-11-07 MED ORDER — CLONAZEPAM 1 MG PO TABS
1.0000 mg | ORAL_TABLET | Freq: Every day | ORAL | Status: DC
  Administered 2015-11-07: 1 mg via ORAL
  Filled 2015-11-07: qty 1

## 2015-11-07 MED ORDER — CHLORDIAZEPOXIDE HCL 10 MG PO CAPS: 10.0000 mg | ORAL_CAPSULE | Freq: Every day | ORAL | Status: DC | PRN

## 2015-11-07 MED ORDER — BUPIVACAINE HCL (PF) 0.25 % IJ SOLN
INTRAMUSCULAR | Status: AC
  Filled 2015-11-07: qty 30

## 2015-11-07 MED ORDER — MORPHINE SULFATE (PF) 2 MG/ML IV SOLN
1.0000 mg | INTRAVENOUS | Status: DC | PRN
  Administered 2015-11-07: 1 mg via INTRAVENOUS
  Filled 2015-11-07: qty 1

## 2015-11-07 MED ORDER — PROGESTERONE MICRONIZED 100 MG PO CAPS
100.0000 mg | ORAL_CAPSULE | Freq: Every day | ORAL | Status: DC
  Administered 2015-11-08: 100 mg via ORAL
  Filled 2015-11-07: qty 1

## 2015-11-07 MED ORDER — GABAPENTIN 100 MG PO CAPS
100.0000 mg | ORAL_CAPSULE | Freq: Every day | ORAL | Status: DC
  Administered 2015-11-07: 100 mg via ORAL
  Filled 2015-11-07 (×2): qty 1

## 2015-11-07 MED ORDER — ACETAMINOPHEN 500 MG PO TABS
1000.0000 mg | ORAL_TABLET | Freq: Four times a day (QID) | ORAL | Status: DC
  Administered 2015-11-07 – 2015-11-08 (×3): 1000 mg via ORAL
  Filled 2015-11-07 (×4): qty 2

## 2015-11-07 MED ORDER — METHOCARBAMOL 1000 MG/10ML IJ SOLN
500.0000 mg | Freq: Once | INTRAVENOUS | Status: AC
  Administered 2015-11-07: 500 mg via INTRAVENOUS
  Filled 2015-11-07: qty 5

## 2015-11-07 MED ORDER — PROPOFOL 10 MG/ML IV BOLUS
INTRAVENOUS | Status: AC
  Filled 2015-11-07: qty 20

## 2015-11-07 MED ORDER — DEXAMETHASONE SODIUM PHOSPHATE 10 MG/ML IJ SOLN: 10.0000 mg | Freq: Once | INTRAMUSCULAR | Status: DC

## 2015-11-07 MED ORDER — MIDAZOLAM HCL 5 MG/5ML IJ SOLN
INTRAMUSCULAR | Status: DC | PRN
  Administered 2015-11-07: 2 mg via INTRAVENOUS

## 2015-11-07 MED ORDER — FENTANYL CITRATE (PF) 100 MCG/2ML IJ SOLN
INTRAMUSCULAR | Status: AC
  Filled 2015-11-07: qty 2

## 2015-11-07 MED ORDER — KETOROLAC TROMETHAMINE 15 MG/ML IJ SOLN: 7.5000 mg | Freq: Four times a day (QID) | INTRAMUSCULAR | Status: AC | PRN

## 2015-11-07 MED ORDER — MIDAZOLAM HCL 2 MG/2ML IJ SOLN
INTRAMUSCULAR | Status: AC
  Filled 2015-11-07: qty 2

## 2015-11-07 MED ORDER — ONDANSETRON HCL 4 MG/2ML IJ SOLN
INTRAMUSCULAR | Status: DC | PRN
  Administered 2015-11-07: 4 mg via INTRAVENOUS

## 2015-11-07 MED ORDER — BUPIVACAINE HCL 0.25 % IJ SOLN
INTRAMUSCULAR | Status: DC | PRN
  Administered 2015-11-07: 30 mL

## 2015-11-07 MED ORDER — HYDROMORPHONE HCL 1 MG/ML IJ SOLN
INTRAMUSCULAR | Status: AC
  Filled 2015-11-07: qty 1

## 2015-11-07 MED ORDER — METOCLOPRAMIDE HCL 5 MG/ML IJ SOLN: 5.0000 mg | Freq: Three times a day (TID) | INTRAMUSCULAR | Status: DC | PRN

## 2015-11-07 MED ORDER — SERTRALINE HCL 50 MG PO TABS
50.0000 mg | ORAL_TABLET | Freq: Every day | ORAL | Status: DC
  Administered 2015-11-07: 50 mg via ORAL
  Filled 2015-11-07 (×2): qty 1

## 2015-11-07 MED ORDER — HYDROMORPHONE HCL 1 MG/ML IJ SOLN
0.2500 mg | INTRAMUSCULAR | Status: DC | PRN
  Administered 2015-11-07 (×4): 0.5 mg via INTRAVENOUS

## 2015-11-07 MED ORDER — METHOCARBAMOL 500 MG PO TABS
500.0000 mg | ORAL_TABLET | Freq: Four times a day (QID) | ORAL | Status: DC | PRN
Start: 2015-11-07 — End: 2015-11-08
  Administered 2015-11-08: 500 mg via ORAL
  Filled 2015-11-07: qty 1

## 2015-11-07 MED ORDER — TRAMADOL HCL 50 MG PO TABS: 50.0000 mg | ORAL_TABLET | Freq: Four times a day (QID) | ORAL | Status: DC | PRN

## 2015-11-07 MED ORDER — ACETAMINOPHEN 10 MG/ML IV SOLN
1000.0000 mg | Freq: Once | INTRAVENOUS | Status: DC
  Filled 2015-11-07: qty 100

## 2015-11-07 MED ORDER — METOCLOPRAMIDE HCL 10 MG PO TABS
5.0000 mg | ORAL_TABLET | Freq: Three times a day (TID) | ORAL | Status: DC | PRN
Start: 2015-11-07 — End: 2015-11-08

## 2015-11-07 MED ORDER — CEFAZOLIN SODIUM-DEXTROSE 2-3 GM-% IV SOLR
INTRAVENOUS | Status: AC
  Filled 2015-11-07: qty 50

## 2015-11-07 MED ORDER — SODIUM CHLORIDE 0.9 % IJ SOLN
INTRAMUSCULAR | Status: AC
  Filled 2015-11-07: qty 50

## 2015-11-07 MED ORDER — LACTATED RINGERS IV SOLN: INTRAVENOUS | Status: DC

## 2015-11-07 MED ORDER — ONDANSETRON HCL 4 MG/2ML IJ SOLN
INTRAMUSCULAR | Status: AC
  Filled 2015-11-07: qty 2

## 2015-11-07 MED ORDER — SODIUM CHLORIDE 0.9 % IV SOLN: INTRAVENOUS | Status: DC

## 2015-11-07 MED ORDER — LACTATED RINGERS IV SOLN
INTRAVENOUS | Status: DC
  Administered 2015-11-07: 1000 mL via INTRAVENOUS

## 2015-11-07 MED ORDER — FENTANYL CITRATE (PF) 100 MCG/2ML IJ SOLN
INTRAMUSCULAR | Status: DC | PRN
  Administered 2015-11-07: 100 ug via INTRAVENOUS

## 2015-11-07 MED ORDER — PANTOPRAZOLE SODIUM 40 MG PO TBEC
40.0000 mg | DELAYED_RELEASE_TABLET | Freq: Every day | ORAL | Status: DC
  Administered 2015-11-08: 40 mg via ORAL
  Filled 2015-11-07: qty 1

## 2015-11-07 MED ORDER — ARTIFICIAL TEARS OP OINT
TOPICAL_OINTMENT | OPHTHALMIC | Status: AC
  Filled 2015-11-07: qty 3.5

## 2015-11-07 MED ORDER — ACETAMINOPHEN 10 MG/ML IV SOLN
INTRAVENOUS | Status: AC
  Filled 2015-11-07: qty 100

## 2015-11-07 MED ORDER — PROPOFOL 10 MG/ML IV BOLUS
INTRAVENOUS | Status: DC | PRN
  Administered 2015-11-07: 150 mg via INTRAVENOUS

## 2015-11-07 MED ORDER — CEFAZOLIN SODIUM-DEXTROSE 2-3 GM-% IV SOLR
2.0000 g | INTRAVENOUS | Status: AC
  Administered 2015-11-07: 2 g via INTRAVENOUS

## 2015-11-07 MED ORDER — ONDANSETRON HCL 4 MG PO TABS
4.0000 mg | ORAL_TABLET | Freq: Four times a day (QID) | ORAL | Status: DC | PRN
  Administered 2015-11-07 – 2015-11-08 (×2): 4 mg via ORAL
  Filled 2015-11-07 (×2): qty 1

## 2015-11-07 MED ORDER — DEXAMETHASONE SODIUM PHOSPHATE 10 MG/ML IJ SOLN
INTRAMUSCULAR | Status: DC | PRN
  Administered 2015-11-07: 10 mg via INTRAVENOUS

## 2015-11-07 MED ORDER — ACETAMINOPHEN 10 MG/ML IV SOLN
INTRAVENOUS | Status: DC | PRN
  Administered 2015-11-07: 1000 mg via INTRAVENOUS

## 2015-11-07 MED ORDER — LIDOCAINE HCL (CARDIAC) 20 MG/ML IV SOLN
INTRAVENOUS | Status: AC
  Filled 2015-11-07: qty 5

## 2015-11-07 MED ORDER — BUPIVACAINE LIPOSOME 1.3 % IJ SUSP
20.0000 mL | Freq: Once | INTRAMUSCULAR | Status: DC
  Filled 2015-11-07: qty 20

## 2015-11-07 MED ORDER — OXYCODONE HCL 5 MG PO TABS
5.0000 mg | ORAL_TABLET | ORAL | Status: DC | PRN
  Administered 2015-11-07: 5 mg via ORAL
  Administered 2015-11-08 (×3): 10 mg via ORAL
  Filled 2015-11-07 (×2): qty 2
  Filled 2015-11-07: qty 1
  Filled 2015-11-07: qty 2

## 2015-11-07 MED ORDER — ONDANSETRON HCL 4 MG/2ML IJ SOLN: 4.0000 mg | Freq: Four times a day (QID) | INTRAMUSCULAR | Status: DC | PRN

## 2015-11-07 SURGICAL SUPPLY — 44 items
ANCH SUT 2 CP-2 EBND QANCHR+ (Anchor) ×1 IMPLANT
ANCHOR SUPER QUICK (Anchor) ×2 IMPLANT
BAG SPEC THK2 15X12 ZIP CLS (MISCELLANEOUS)
BAG ZIPLOCK 12X15 (MISCELLANEOUS) IMPLANT
BIT DRILL 2.4X128 (BIT) IMPLANT
BIT DRILL 2.4X128MM (BIT)
BLADE EXTENDED COATED 6.5IN (ELECTRODE) ×3 IMPLANT
CLOSURE WOUND 1/2 X4 (GAUZE/BANDAGES/DRESSINGS) ×1
DRAPE INCISE IOBAN 66X45 STRL (DRAPES) ×3 IMPLANT
DRAPE ORTHO SPLIT 77X108 STRL (DRAPES) ×6
DRAPE POUCH INSTRU U-SHP 10X18 (DRAPES) ×3 IMPLANT
DRAPE SURG ORHT 6 SPLT 77X108 (DRAPES) ×2 IMPLANT
DRAPE U-SHAPE 47X51 STRL (DRAPES) ×3 IMPLANT
DRSG ADAPTIC 3X8 NADH LF (GAUZE/BANDAGES/DRESSINGS) ×3 IMPLANT
DRSG MEPILEX BORDER 4X4 (GAUZE/BANDAGES/DRESSINGS) IMPLANT
DRSG MEPILEX BORDER 4X8 (GAUZE/BANDAGES/DRESSINGS) ×3 IMPLANT
DURAPREP 26ML APPLICATOR (WOUND CARE) ×3 IMPLANT
ELECT REM PT RETURN 9FT ADLT (ELECTROSURGICAL) ×3
ELECTRODE REM PT RTRN 9FT ADLT (ELECTROSURGICAL) ×1 IMPLANT
GAUZE SPONGE 4X4 12PLY STRL (GAUZE/BANDAGES/DRESSINGS) ×1 IMPLANT
GLOVE BIO SURGEON STRL SZ7.5 (GLOVE) ×3 IMPLANT
GLOVE BIO SURGEON STRL SZ8 (GLOVE) ×3 IMPLANT
GLOVE BIOGEL PI IND STRL 8 (GLOVE) ×2 IMPLANT
GLOVE BIOGEL PI INDICATOR 8 (GLOVE) ×4
GOWN STRL REUS W/TWL LRG LVL3 (GOWN DISPOSABLE) ×3 IMPLANT
GOWN STRL REUS W/TWL XL LVL3 (GOWN DISPOSABLE) ×3 IMPLANT
KIT BASIN OR (CUSTOM PROCEDURE TRAY) ×3 IMPLANT
MANIFOLD NEPTUNE II (INSTRUMENTS) ×3 IMPLANT
NDL SAFETY ECLIPSE 18X1.5 (NEEDLE) ×2 IMPLANT
NEEDLE HYPO 18GX1.5 SHARP (NEEDLE) ×6
NS IRRIG 1000ML POUR BTL (IV SOLUTION) ×3 IMPLANT
PACK TOTAL JOINT (CUSTOM PROCEDURE TRAY) ×3 IMPLANT
PASSER SUT SWANSON 36MM LOOP (INSTRUMENTS) IMPLANT
POSITIONER SURGICAL ARM (MISCELLANEOUS) ×3 IMPLANT
STRIP CLOSURE SKIN 1/2X4 (GAUZE/BANDAGES/DRESSINGS) ×2 IMPLANT
SUT ETHIBOND NAB CT1 #1 30IN (SUTURE) IMPLANT
SUT MNCRL AB 4-0 PS2 18 (SUTURE) ×3 IMPLANT
SUT VIC AB 1 CT1 27 (SUTURE) ×6
SUT VIC AB 1 CT1 27XBRD ANTBC (SUTURE) ×2 IMPLANT
SUT VIC AB 2-0 CT1 27 (SUTURE) ×6
SUT VIC AB 2-0 CT1 TAPERPNT 27 (SUTURE) ×2 IMPLANT
SYR 20CC LL (SYRINGE) ×3 IMPLANT
SYR 50ML LL SCALE MARK (SYRINGE) ×3 IMPLANT
TOWEL OR 17X26 10 PK STRL BLUE (TOWEL DISPOSABLE) ×3 IMPLANT

## 2015-11-07 NOTE — Anesthesia Postprocedure Evaluation (Signed)
Anesthesia Post Note  Patient: Alice [name]  Procedure(s) Performed: Procedure(s) (LRB): LEFT HIP BURSECTOMY WITH GLUTEAL TENDON REPAIR (Left)  Patient location during evaluation: PACU Anesthesia Type: General Level of consciousness: awake and alert Pain management: pain level controlled Vital Signs Assessment: post-procedure vital signs reviewed and stable Respiratory status: spontaneous breathing, nonlabored ventilation, respiratory function stable and patient connected to nasal cannula oxygen Cardiovascular status: blood pressure returned to baseline and stable Postop Assessment: no signs of nausea or vomiting Anesthetic complications: no    Last Vitals:  Filed Vitals:   11/07/15 1639 11/07/15 1703  BP: 135/76 142/58  Pulse: 58 58  Temp: 36.6 C 36.8 C  Resp: 11 15    Last Pain:  Filed Vitals:   11/07/15 1704  PainSc: 6                  Mckell Riecke L

## 2015-11-07 NOTE — Anesthesia Preprocedure Evaluation (Signed)
Anesthesia Evaluation  Patient identified by MRN, date of birth, ID band Patient awake    Reviewed: Allergy & Precautions, NPO status , Patient's Chart, lab work & pertinent test results  Airway Mallampati: II  TM Distance: >3 FB Neck ROM: Full    Dental no notable dental hx. (+) Dental Advisory Given, Teeth Intact   Pulmonary neg pulmonary ROS,    Pulmonary exam normal breath sounds clear to auscultation       Cardiovascular hypertension, negative cardio ROS Normal cardiovascular exam Rhythm:Regular Rate:Normal     Neuro/Psych Back fusion negative neurological ROS  negative psych ROS   GI/Hepatic negative GI ROS, Neg liver ROS, hiatal hernia, GERD  Medicated,  Endo/Other  negative endocrine ROS  Renal/GU negative Renal ROS  negative genitourinary   Musculoskeletal negative musculoskeletal ROS (+)   Abdominal   Peds negative pediatric ROS (+)  Hematology negative hematology ROS (+) anemia ,   Anesthesia Other Findings   Reproductive/Obstetrics negative OB ROS                             Anesthesia Physical Anesthesia Plan  ASA: II  Anesthesia Plan: General   Post-op Pain Management:    Induction: Intravenous  Airway Management Planned: Oral ETT  Additional Equipment:   Intra-op Plan:   Post-operative Plan: Extubation in OR  Informed Consent:   Plan Discussed with: Surgeon  Anesthesia Plan Comments:         Anesthesia Quick Evaluation

## 2015-11-07 NOTE — H&P (Signed)
CC- N Alice [name] is a 64 y.o. female who presents with left hip pain  Hip Pain: Patient complains of left hip pain. Onset of the symptoms was several months ago. Inciting event: none. Current symptoms include lateral hip pain with ambulation and with lying on left side. Associated symptoms: none. Aggravating symptoms: lateral movements, pivoting, rising after sitting and laying on left side. . Evaluation to date: MRI with gluteal tendon tear.  Treatment to date: steroid injection which did not help.  Past Medical History  Diagnosis Date  . Hyperlipidemia   . H/O hiatal hernia   . Depression   . Anxiety   . DDD (degenerative disc disease), lumbar   . GERD (gastroesophageal reflux disease)   . Sciatica     getting prednisone inj/epidural injections  . Kidney stone   . Tinnitus   . Cancer ([place])     basal cell carcinoma per right side under arm   . History of stomach ulcers   . Anorexia     HISTORY OF     Past Surgical History  Procedure Laterality Date  . Appendectomy  1959  . Tonsillectomy  1976  . Rotator cuff repair Left 2003  . Elbow surgery Left 2003  . Cystoscopy kidney w/ ureteral guide wire    . Cyst excision Right     foot  . Colonoscopy  2015    dilation of esophagus and polypectomy  . Upper gastrointestinal endoscopy  2015  . Breast biopsy Right     benign cyst  . Eye surgery Right 2012    right-pterigium  . Back surgery      with rods and screws placed in lumbar area     Prior to Admission medications   Medication Sig Start Date End Date Taking? Authorizing Provider  clonazePAM (KLONOPIN) 1 MG tablet Take 1 mg by mouth at bedtime.   Yes Historical Provider, MD  diazepam (VALIUM) 5 MG tablet Take 5 mg by mouth every 6 (six) hours as needed. Muscle spasms 08/18/15  Yes Historical Provider, MD  estradiol (MINIVELLE) 0.05 MG/24HR patch Place 1 patch onto the skin 2 (two) times a week. Wednesdays and Sundays   Yes Historical Provider, MD  gabapentin (NEURONTIN)  100 MG capsule Take 100 mg by mouth at bedtime. 10/05/15  Yes Historical Provider, MD  HYDROcodone-acetaminophen (NORCO/VICODIN) 5-325 MG tablet Take 1 tablet by mouth daily as needed. Severe pain 10/16/15  Yes Historical Provider, MD  meloxicam (MOBIC) 15 MG tablet Take 15 mg by mouth daily. 10/17/15  Yes Historical Provider, MD  methocarbamol (ROBAXIN) 500 MG tablet Take 500 mg by mouth every 6 (six) hours as needed. Muscle spasms 09/19/15  Yes Historical Provider, MD  pantoprazole (PROTONIX) 40 MG tablet Take 1 tablet (40 mg total) by mouth daily. 08/31/14  Yes Inda Castle, MD  Progesterone 100 MG CAPS Take 100 mg by mouth daily.    Yes Historical Provider, MD  sertraline (ZOLOFT) 50 MG tablet Take 50 mg by mouth at bedtime.  03/03/15  Yes Historical Provider, MD  Bepotastine Besilate (BEPREVE) 1.5 % SOLN Place 1 drop into both eyes daily as needed (burning or irritation).    Historical Provider, MD  chlordiazePOXIDE (LIBRIUM) 10 MG capsule Take 10 mg by mouth daily as needed (stomach pain).     Historical Provider, MD  doxycycline (ORACEA) 40 MG capsule Take 40 mg by mouth See admin instructions. 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  03/16/15   Historical Provider, MD  metroNIDAZOLE (METROCREAM) 0.75 % cream Apply 1 application topically See admin instructions. Applies twice a day during rosacea breakouts 12/02/14   Historical Provider, MD  nystatin (MYCOSTATIN) 100000 UNIT/ML suspension Take 5-10 mLs by mouth 4 (four) times daily as needed (thrush caused by doxycycline). Swish, gargle and swallow    Historical Provider, MD  Polyethyl Glycol-Propyl Glycol (SYSTANE OP) Place 1 drop into both eyes daily as needed (dry eyes).    Historical Provider, MD    Physical Examination: General appearance - alert, well appearing, and in no  distress Mental status - alert, oriented to person, place, and time Chest - clear to auscultation, no wheezes, rales or rhonchi, symmetric air entry Heart - normal rate, regular rhythm, normal S1, S2, no murmurs, rubs, clicks or gallops Abdomen - soft, nontender, nondistended, no masses or organomegaly Neurological - alert, oriented, normal speech, no focal findings or movement disorder noted  A left hip exam was performed. GENERAL: no acute distress SKIN: intact SWELLING: none WARMTH: no warmth TENDERNESS: maximal at greater trochanter ROM: normal STRENGTH: normal GAIT: antalgic  ASSESSMENT: Left hip intractable bursitis with gluteal tendon tear  PLAN: Left hip bursectomy and gluteal tendon repair. Discussed in detail with patient who elects to proceed.    Dione Plover Aiyonna Lucado, MD    11/07/2015, 2:30 PM

## 2015-11-07 NOTE — Anesthesia Procedure Notes (Signed)
Procedure Name: Intubation Date/Time: 11/07/2015 2:47 PM Performed by: Lajuana Carry E Pre-anesthesia Checklist: Patient identified, Emergency Drugs available, Suction available and Patient being monitored Patient Re-evaluated:Patient Re-evaluated prior to inductionOxygen Delivery Method: Circle System Utilized Preoxygenation: Pre-oxygenation with 100% oxygen Intubation Type: IV induction Ventilation: Mask ventilation without difficulty Laryngoscope Size: Miller and 2 Grade View: Grade I Tube type: Oral Number of attempts: 1 Placement Confirmation: ETT inserted through vocal cords under direct vision,  positive ETCO2 and breath sounds checked- equal and bilateral Secured at: 21 cm Tube secured with: Tape Dental Injury: Teeth and Oropharynx as per pre-operative assessment

## 2015-11-07 NOTE — Interval H&P Note (Signed)
History and Physical Interval Note:  11/07/2015 2:33 PM  Alice [name]  has presented today for surgery, with the diagnosis of left hip gluteal tendon tear  The various methods of treatment have been discussed with the patient and family. After consideration of risks, benefits and other options for treatment, the patient has consented to  Procedure(s): [place] (Left) as a surgical intervention .  The patient's history has been reviewed, patient examined, no change in status, stable for surgery.  I have reviewed the patient's chart and labs.  Questions were answered to the patient's satisfaction.     [name]

## 2015-11-07 NOTE — Transfer of Care (Signed)
Immediate Anesthesia Transfer of Care Note  Patient: Alice [name]  Procedure(s) Performed: Procedure(s): LEFT HIP BURSECTOMY WITH GLUTEAL TENDON REPAIR (Left)  Patient Location: PACU  Anesthesia Type:General  Level of Consciousness:  sedated, patient cooperative and responds to stimulation  Airway & Oxygen Therapy:Patient Spontanous Breathing and Patient connected to face mask oxgen  Post-op Assessment:  Report given to PACU RN and Post -op Vital signs reviewed and stable  Post vital signs:  Reviewed and stable  Last Vitals:  Filed Vitals:   11/07/15 1119  BP: 126/69  Pulse: 60  Temp: 36.6 C  Resp: 18    Complications: No apparent anesthesia complications

## 2015-11-07 NOTE — Brief Op Note (Signed)
11/07/2015  3:28 PM  PATIENT:  Alice [name]  64 y.o. female  PRE-OPERATIVE DIAGNOSIS:  left hip gluteal tendon tear  POST-OPERATIVE DIAGNOSIS:  left hip gluteal tendon tear and bursitis  PROCEDURE:  Procedure(s): LEFT HIP BURSECTOMY WITH GLUTEAL TENDON REPAIR (Left)  SURGEON:  Surgeon(s) and Role:    * Gaynelle Arabian, MD - Primary  PHYSICIAN ASSISTANT:   ASSISTANTS: Arlee Muslim, PA-C   ANESTHESIA:   general  EBL:     DRAINS: none   LOCAL MEDICATIONS USED:  MARCAINE     COUNTS:  YES  TOURNIQUET:  * No tourniquets in log *  DICTATION: .Other Dictation: Dictation Number [id number]  PLAN OF CARE: Admit for overnight observation  PATIENT DISPOSITION:  PACU - hemodynamically stable.

## 2015-11-08 DIAGNOSIS — M7062 Trochanteric bursitis, left hip: Secondary | ICD-10-CM | POA: Diagnosis not present

## 2015-11-08 MED ORDER — OXYCODONE HCL 5 MG PO TABS: 5.0000 mg | ORAL_TABLET | ORAL | Status: DC | PRN

## 2015-11-08 MED ORDER — DIAZEPAM 5 MG PO TABS: 5.0000 mg | ORAL_TABLET | Freq: Four times a day (QID) | ORAL | Status: DC | PRN

## 2015-11-08 MED ORDER — TRAMADOL HCL 50 MG PO TABS: 50.0000 mg | ORAL_TABLET | Freq: Four times a day (QID) | ORAL | Status: DC | PRN

## 2015-11-08 NOTE — Op Note (Signed)
NAMEKALIS, [name] NO.:  [id number]  MEDICAL RECORD NO.:  [id number]  LOCATION:  [id number]                         FACILITY:  [facility]  PHYSICIAN:  Gaynelle Arabian, M.D.    DATE OF BIRTH:  May 14, 1951  DATE OF PROCEDURE:  11/07/2015 DATE OF DISCHARGE:                              OPERATIVE REPORT   PREOPERATIVE DIAGNOSIS:  Left hip intractable bursitis with gluteal tendon tear.  POSTOPERATIVE DIAGNOSIS:  Left hip intractable bursitis with gluteal tendon tear.  PROCEDURE:  Left hip bursectomy with gluteal tendon repair.  SURGEON:  Gaynelle Arabian, M.D.  ASSISTANT:  Alexzandrew L. Perkins, P.A.C.  ANESTHESIA:  General.  ESTIMATED BLOOD LOSS:  Minimal.  DRAINS:  None.  COMPLICATIONS:  None.  CONDITION:  Stable to recovery.  BRIEF CLINICAL NOTE:  Alice [name] is a 64 year old female with several- month history of left hip pain with limp.  She has had injections.  She presented to me with MRI showing a gluteus medius tendon tear.  She has had significant pain and is not responded to nonoperative management. She presents now for bursectomy and gluteal tendon repair.  PROCEDURE IN DETAIL:  After successful administration of general anesthetic, the patient was placed in the right lateral decubitus position with the left side up and held with the hip positioner.  Left thigh was then prepped and draped in the usual sterile fashion.  An incision was made, centered over the tip of the greater trochanter, about 4 inches in length.  The skin was cut with a 10-blade through subcutaneous tissue to the level of the fascia lata, which was then incised in line with the skin incision.  The bursa was identified and it was calcified and thickened.  There was fluid also present at this level.  The bursa was removed with electrocautery.  There was no gross tearing of the gluteus medius tendon, but palpation over the tip of the greater trochanter showed an area about 1 cm in width at  the junction of the anterior and central thirds of the gluteus medius that was detached from the greater trochanter.  There was flimsy tissue over this which I removed.  Small amount of fluid present when I went through that tissue. We then placed a Mitek suture anchor at the tip of the greater trochanter and past both needles through the tendon and then advanced the tendon back down onto the greater trochanter.  The sutures were then tied over the tendon.  This was felt to be very sturdy repair.  No other tears were noted.  Note that when we had the tear exposed, I was able to palpate under the gluteus medius and palpate the tendon of gluteus minimus, which was intact.  After the repair was completed, then the wound was copiously irrigated with saline solution.  A 30 mL of 0.25% Marcaine was then injected into the gluteal muscles, fascia lata and the subcutaneous tissues.  The fascia lata was then closed with a running #1 Vicryl.  A triangular piece of tissue over the greater trochanter was removed to prevent friction and rubbing of the tendon on the trochanter. The subcu was then  closed with interrupted 2-0 Vicryl and subcuticular running 4-0 Monocryl.  The incision was cleaned and dried and Steri- Strips and a bulky sterile dressing applied.  She was then awakened and transported to recovery in stable condition.     Gaynelle Arabian, M.D.     FA/MEDQ  D:  11/07/2015  T:  11/08/2015  Job:  [id number]

## 2015-11-08 NOTE — Discharge Summary (Signed)
Physician Discharge Summary   Patient ID: Alice [name] MRN: [id number] DOB/AGE: 1951-09-10 64 y.o.  Admit date: 11/07/2015 Discharge date: 11/08/2015  Primary Diagnosis:  Greater trochanteric bursitis of left hip  Admission Diagnoses:  Past Medical History  Diagnosis Date  . Hyperlipidemia   . H/O hiatal hernia   . Depression   . Anxiety   . DDD (degenerative disc disease), lumbar   . GERD (gastroesophageal reflux disease)   . Sciatica     getting prednisone inj/epidural injections  . Kidney stone   . Tinnitus   . Cancer ([place])     basal cell carcinoma per right side under arm   . History of stomach ulcers   . Anorexia     HISTORY OF    Discharge Diagnoses:   Principal Problem:   Greater trochanteric bursitis of left hip Active Problems:   Trochanteric bursitis of left hip  Estimated body mass index is 22.05 kg/(m^2) as calculated from the following:   Height as of this encounter: '5\' 5"'  (1.651 m).   Weight as of this encounter: 60.102 kg (132 lb 8 oz).  Procedure(s) (LRB): LEFT HIP BURSECTOMY WITH GLUTEAL TENDON REPAIR (Left)   Consults: None  HPI: Alice [name] is a 64 year old female with several- month history of left hip pain with limp. She has had injections. She presented to me with MRI showing a gluteus medius tendon tear. She has had significant pain and is not responded to nonoperative management. She presents now for bursectomy and gluteal tendon repair.  Laboratory Data: Hospital Outpatient Visit on 11/01/2015  Component Date Value Ref Range Status  . WBC 11/01/2015 7.5  4.0 - 10.5 K/uL Final  . RBC 11/01/2015 4.72  3.87 - 5.11 MIL/uL Final  . Hemoglobin 11/01/2015 12.0  12.0 - 15.0 g/dL Final  . HCT 11/01/2015 37.8  36.0 - 46.0 % Final  . MCV 11/01/2015 80.1  78.0 - 100.0 fL Final  . MCH 11/01/2015 25.4* 26.0 - 34.0 pg Final  . MCHC 11/01/2015 31.7  30.0 - 36.0 g/dL Final  . RDW 11/01/2015 17.3* 11.5 - 15.5 % Final  . Platelets 11/01/2015 190  150  - 400 K/uL Final  . Sodium 11/01/2015 142  135 - 145 mmol/L Final  . Potassium 11/01/2015 5.0  3.5 - 5.1 mmol/L Final  . Chloride 11/01/2015 110  101 - 111 mmol/L Final  . CO2 11/01/2015 27  22 - 32 mmol/L Final  . Glucose, Bld 11/01/2015 85  65 - 99 mg/dL Final  . BUN 11/01/2015 17  6 - 20 mg/dL Final  . Creatinine, Ser 11/01/2015 0.89  0.44 - 1.00 mg/dL Final  . Calcium 11/01/2015 9.8  8.9 - 10.3 mg/dL Final  . GFR calc non Af Amer 11/01/2015 >60  >60 mL/min Final  . GFR calc Af Amer 11/01/2015 >60  >60 mL/min Final   Comment: (NOTE) The eGFR has been calculated using the CKD EPI equation. This calculation has not been validated in all clinical situations. eGFR's persistently <60 mL/min signify possible Chronic Kidney Disease.   . Anion gap 11/01/2015 5  5 - 15 Final     X-Rays:No results found.  EKG: Orders placed or performed during the hospital encounter of 05/31/15  . EKG  . EKG  . EKG 12-Lead  . EKG 12-Lead     Hospital Course: Patient was admitted to [facility] and taken to the OR and underwent the above state procedure without complications.  Patient tolerated the procedure well  and was later transferred to the recovery room and then to the orthopaedic floor for postoperative care.  They were given PO and IV analgesics for pain control following their surgery.  They were given 24 hours of postoperative antibiotics of  Anti-infectives    Start     Dose/Rate Route Frequency Ordered Stop   11/07/15 1138  ceFAZolin (ANCEF) IVPB 2 g/50 mL premix     2 g 100 mL/hr over 30 Minutes Intravenous On call to O.R. 11/07/15 1138 11/07/15 1509     and started on DVT prophylaxis in the form of Lovenox. Patient was encouraged to get up and ambulate the day after surgery.  The patient was allowed to be WBAT with therapy. Discharge planning was consulted to help with postop disposition and equipment needs.  Patient had a decent night on the evening of surgery.  They started to  get up OOB with therapy on day one.   Patient was seen in rounds and was ready to go home following morning ambulation.  Discharge home Diet - Cardiac diet Follow up - in two weeks. Call office for appointment at [phone]. Activity - Weight bearing as tolerated to the surgical leg. No active abduction of the leg, (no pulling leg out to the side away from the body). Walker for first several days until comfortable ambulating. May start showering three days following surgery but do not submerge incision under water. Continue to use ice for pain and swelling from surgery.  Baby Aspirin 81 mg daily for three weeks.  Please use walker for the first couple of days until comfortable ambulating.  May start changing dressing tomorrow with dry gauze and tape.  Disposition - Home Condition Upon Discharge - Good D/C Meds - See DC Summary DVT Prophylaxis - Aspirin 81 mg daily for three weeks   Discharge Instructions    Call MD / Call 911    Complete by:  As directed   If you experience chest pain or shortness of breath, CALL 911 and be transported to the hospital emergency room.  If you develope a fever above 101 F, pus (white drainage) or increased drainage or redness at the wound, or calf pain, call your surgeon's office.     Change dressing    Complete by:  As directed   You may change your dressing dressing daily with sterile 4 x 4 inch gauze dressing and paper tape.  Do not submerge the incision under water.     Constipation Prevention    Complete by:  As directed   Drink plenty of fluids.  Prune juice may be helpful.  You may use a stool softener, such as Colace (over the counter) 100 mg twice a day.  Use MiraLax (over the counter) for constipation as needed.     Diet - low sodium heart healthy    Complete by:  As directed      Discharge instructions    Complete by:  As directed   Pick up stool softner and laxative for home use following surgery while on pain medications. Do not submerge  incision under water. May remove the surgical dressing tomorrow, Wednesday 11/09/2015, and then apply a dry gauze dressing daily. Please use good hand washing techniques while changing dressing each day. May shower starting three days after surgery starting Thursday 11/10/2015. Please use a clean towel to pat the incision dry following showers. Continue to use ice for pain and swelling after surgery. Do not use any lotions or creams on  the incision until instructed by your surgeon.  Postoperative Constipation Protocol  Constipation - defined medically as fewer than three stools per week and severe constipation as less than one stool per week.  One of the most common issues patients have following surgery is constipation. Even if you have a regular bowel pattern at home, your normal regimen is likely to be disrupted due to multiple reasons following surgery. Combination of anesthesia, postoperative narcotics, change in appetite and fluid intake all can affect your bowels. In order to avoid complications following surgery, here are some recommendations in order to help you during your recovery period.  Colace (docusate) - Pick up an over-the-counter form of Colace or another stool softener and take twice a day as long as you are requiring postoperative pain medications. Take with a full glass of water daily. If you experience loose stools or diarrhea, hold the colace until you stool forms back up. If your symptoms do not get better within 1 week or if they get worse, check with your doctor.  Dulcolax (bisacodyl) - Pick up over-the-counter and take as directed by the product packaging as needed to assist with the movement of your bowels. Take with a full glass of water. Use this product as needed if not relieved by Colace only.   MiraLax (polyethylene glycol) - Pick up over-the-counter to have on hand. MiraLax is a solution that will increase the amount of water in your bowels to assist with  bowel movements. Take as directed and can mix with a glass of water, juice, soda, coffee, or tea. Take if you go more than two days without a movement. Do not use MiraLax more than once per day. Call your doctor if you are still constipated or irregular after using this medication for 7 days in a row.  If you continue to have problems with postoperative constipation, please contact the office for further assistance and recommendations. If you experience "the worst abdominal pain ever" or develop nausea or vomiting, please contact the office immediatly for further recommendations for treatment.  Take a baby 81 mg Aspirin daily for three weeks and then discontinue the Aspirin.       Do not sit on low chairs, stoools or toilet seats, as it may be difficult to get up from low surfaces    Complete by:  As directed      Driving restrictions    Complete by:  As directed   No driving until released by the physician.     Increase activity slowly as tolerated    Complete by:  As directed      Lifting restrictions    Complete by:  As directed   No lifting until released by the physician.     Patient may shower    Complete by:  As directed   You may shower without a dressing once there is no drainage.  Do not wash over the wound.  If drainage remains, do not shower until drainage stops.     TED hose    Complete by:  As directed   Use stockings (TED hose) for 3 weeks on both leg(s).  You may remove them at night for sleeping.     Weight bearing as tolerated    Complete by:  As directed   Laterality:  left  Extremity:  Lower            Medication List    STOP taking these medications  HYDROcodone-acetaminophen 5-325 MG tablet  Commonly known as:  NORCO/VICODIN     methocarbamol 500 MG tablet  Commonly known as:  ROBAXIN     MINIVELLE 0.05 MG/24HR patch  Generic drug:  estradiol     Progesterone 100 MG Caps      TAKE these medications        BEPREVE 1.5 % Soln  Generic  drug:  Bepotastine Besilate  Place 1 drop into both eyes daily as needed (burning or irritation).     chlordiazePOXIDE 10 MG capsule  Commonly known as:  LIBRIUM  Take 10 mg by mouth daily as needed (stomach pain).     clonazePAM 1 MG tablet  Commonly known as:  KLONOPIN  Take 1 mg by mouth at bedtime.     diazepam 5 MG tablet  Commonly known as:  VALIUM  Take 1 tablet (5 mg total) by mouth every 6 (six) hours as needed for muscle spasms. Muscle spasms     doxycycline 40 MG capsule  Commonly known as:  ORACEA  Take 40 mg by mouth See admin instructions. 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     gabapentin 100 MG capsule  Commonly known as:  NEURONTIN  Take 100 mg by mouth at bedtime.     meloxicam 15 MG tablet  Commonly known as:  MOBIC  Take 15 mg by mouth daily.     metroNIDAZOLE 0.75 % cream  Commonly known as:  METROCREAM  Apply 1 application topically See admin instructions. Applies twice a day during rosacea breakouts     nystatin 100000 UNIT/ML suspension  Commonly known as:  MYCOSTATIN  Take 5-10 mLs by mouth 4 (four) times daily as needed (thrush caused by doxycycline). Swish, gargle and swallow     oxyCODONE 5 MG immediate release tablet  Commonly known as:  Oxy IR/ROXICODONE  Take 1-2 tablets (5-10 mg total) by mouth every 3 (three) hours as needed for moderate pain or severe pain.     pantoprazole 40 MG tablet  Commonly known as:  PROTONIX  Take 1 tablet (40 mg total) by mouth daily.     sertraline 50 MG tablet  Commonly known as:  ZOLOFT  Take 50 mg by mouth at bedtime.     SYSTANE OP  Place 1 drop into both eyes daily as needed (dry eyes).     traMADol 50 MG tablet  Commonly known as:  ULTRAM  Take 1-2 tablets (50-100 mg total) by mouth every 6 (six) hours as needed (mild pain).            Follow-up Information    Follow up with [name], MD On 11/24/2015.   Specialty:  Orthopedic Surgery   Why:  Call office at [phone] to setup appointment time of 10:30 am with Dr. Wynelle Link on Thursday 11/24/2015.   Contact information:   [address] [place] [address] [phone]       Signed: Arlee Muslim, PA-C Orthopaedic Surgery 11/08/2015, 8:46 AM

## 2015-11-08 NOTE — Evaluation (Signed)
Physical Therapy Evaluation Patient Details Name: Alice [name] MRN: [id number] DOB: 1951/05/11 Today's Date: 11/08/2015   History of Present Illness  64 yo female s/p L hip bursectomy with glut tendon repair 11/07/15. Hx of lumbar fusion 06/2015  Clinical Impression  On eval, pt was supervision level assist for mobility-walked ~200 feet with RW. Practiced negotiating 1 step to enter home. Educated pt on avoiding active L hip abduction. All education completed. Pt states she has access to RW at home. Instructed pt to continue using RW for ambulation. Ready to d/c from PT standpoint.     Follow Up Recommendations No PT follow up;Supervision - Intermittent    Equipment Recommendations  None recommended by PT    Recommendations for Other Services       Precautions / Restrictions Precautions Precautions: Fall;Other (comment) Precaution Comments: No active L hip abduction Restrictions Weight Bearing Restrictions: No LLE Weight Bearing: Weight bearing as tolerated      Mobility  Bed Mobility               General bed mobility comments: pt oob. Pt reports she logrolls for bed mobility due to recent back surgery.  Transfers Overall transfer level: Needs assistance   Transfers: Sit to/from Stand Sit to Stand: Supervision         General transfer comment: VCs safety  Ambulation/Gait Ambulation/Gait assistance: Supervision Ambulation Distance (Feet): 200 Feet Assistive device: Rolling walker (2 wheeled) Gait Pattern/deviations: Step-through pattern;Decreased stride length     General Gait Details: supervision for safety.   Stairs Stairs: Yes Stairs assistance: Min guard Stair Management: Step to pattern;Forwards;With walker Number of Stairs: 1 General stair comments: VCs safety, sequence, technique. close guard for safety  Wheelchair Mobility    Modified Rankin (Stroke Patients Only)       Balance                                              Pertinent Vitals/Pain Pain Assessment: 0-10 Pain Score: 5  Pain Location: L hip/lower back area Pain Descriptors / Indicators: Sore Pain Intervention(s): Repositioned    Home Living Family/patient expects to be discharged to:: Private residence Living Arrangements: Parent;Other relatives Available Help at Discharge: Family Type of Home: House Home Access: Stairs to enter   [employer] of Steps: 1 Home Layout: One level Home Equipment: [place] - 2 wheels;Walker - 4 wheels Additional Comments: plans to live at her mother's house    Prior Function Level of Independence: Independent               Hand Dominance        Extremity/Trunk Assessment   Upper Extremity Assessment: Overall WFL for tasks assessed           Lower Extremity Assessment: Generalized weakness      Cervical / Trunk Assessment: Normal  Communication   Communication: No difficulties  Cognition Arousal/Alertness: Awake/alert Behavior During Therapy: WFL for tasks assessed/performed Overall Cognitive Status: Within Functional Limits for tasks assessed                      General Comments      Exercises        Assessment/Plan    PT Assessment Patent does not need any further PT services  PT Diagnosis     PT Problem List    PT Treatment  Interventions     PT Goals (Current goals can be found in the Care Plan section) Acute Rehab PT Goals Patient Stated Goal: home PT Goal Formulation: All assessment and education complete, DC therapy    Frequency     Barriers to discharge        Co-evaluation               End of Session Equipment Utilized During Treatment: Gait belt Activity Tolerance: Patient tolerated treatment well Patient left: in chair;with call bell/phone within reach;with family/visitor present      Functional Assessment Tool Used: clinical judgement Functional Limitation: Mobility: Walking and moving around Mobility: Walking and  Moving Around Current Status [id number]): At least 1 percent but less than 20 percent impaired, limited or restricted Mobility: Walking and Moving Around Goal Status [phone]): At least 1 percent but less than 20 percent impaired, limited or restricted Mobility: Walking and Moving Around Discharge Status [phone]): At least 1 percent but less than 20 percent impaired, limited or restricted    Time: 1130-1147 PT Time Calculation (min) (ACUTE ONLY): 17 min   Charges:   PT Evaluation $Initial PT Evaluation Tier I: 1 Procedure     PT G Codes:   PT G-Codes **NOT FOR INPATIENT CLASS** Functional Assessment Tool Used: clinical judgement Functional Limitation: Mobility: Walking and moving around Mobility: Walking and Moving Around Current Status [id number]): At least 1 percent but less than 20 percent impaired, limited or restricted Mobility: Walking and Moving Around Goal Status [phone]): At least 1 percent but less than 20 percent impaired, limited or restricted Mobility: Walking and Moving Around Discharge Status [phone]): At least 1 percent but less than 20 percent impaired, limited or restricted    Weston Anna, MPT Pager: [phone]

## 2015-11-08 NOTE — Care Management Note (Signed)
Case Management Note  Patient Details  Name: Alice [name] MRN: [id number] Date of Birth: 09-14-1951  Subjective/Objective:        S/p Left hip bursectomy with gluteal tendon repair            Action/Plan: Discharge planning, spoke with patient at bedside. States she plans to d/c to her mothers house. She and her mother care for each other, her mother does all the cooking and drives. Patient feels she has a safe d/c plan. No equipment needed, she states she has a walker, cane, and crutches at home.   Expected Discharge Date:  11/08/15               Expected Discharge Plan:  Home/Self Care  In-House Referral:  NA  Discharge planning Services  CM Consult  Post Acute Care Choice:  NA Choice offered to:  NA  DME Arranged:  N/A DME Agency:  NA  HH Arranged:  NA HH Agency:  NA  Status of Service:  Completed, signed off  Medicare Important Message Given:    Date Medicare IM Given:    Medicare IM give by:    Date Additional Medicare IM Given:    Additional Medicare Important Message give by:     If discussed at [place] of Stay Meetings, dates discussed:    Additional Comments:  Guadalupe Maple, RN 11/08/2015, 11:24 AM

## 2015-11-08 NOTE — Progress Notes (Signed)
   Subjective: 1 Day Post-Op Procedure(s) (LRB): LEFT HIP BURSECTOMY WITH GLUTEAL TENDON REPAIR (Left) Patient reports pain as mild.   Patient seen in rounds with Dr. Wynelle Link. Patient is well, but has had some minor complaints of pain in the hip, requiring pain medications Patient is ready to go home.  Objective: Vital signs in last 24 hours: Temp:  [97.5 F (36.4 C)-98.3 F (36.8 C)] 98 F (36.7 C) (12/06 [id number]) Pulse Rate:  [58-75] 70 (12/06 0613) Resp:  [11-23] 15 (12/06 0613) BP: (116-145)/(56-84) 116/61 mmHg (12/06 0613) SpO2:  [99 %-100 %] 100 % (12/06 0613) Weight:  [60.102 kg (132 lb 8 oz)] 60.102 kg (132 lb 8 oz) (12/05 1140)  Intake/Output from previous day:  Intake/Output Summary (Last 24 hours) at 11/08/15 0834 Last data filed at 11/08/15 0615  Gross per 24 hour  Intake   3250 ml  Output   1500 ml  Net   1750 ml    EXAM: General - Patient is Alert, Appropriate and Oriented Extremity - Neurovascular intact Sensation intact distally Dorsiflexion/Plantar flexion intact Dressing - clean, dry, no drainage Motor Function - intact, moving foot and toes well on exam.   Assessment/Plan: 1 Day Post-Op Procedure(s) (LRB): LEFT HIP BURSECTOMY WITH GLUTEAL TENDON REPAIR (Left) Procedure(s) (LRB): LEFT HIP BURSECTOMY WITH GLUTEAL TENDON REPAIR (Left) Past Medical History  Diagnosis Date  . Hyperlipidemia   . H/O hiatal hernia   . Depression   . Anxiety   . DDD (degenerative disc disease), lumbar   . GERD (gastroesophageal reflux disease)   . Sciatica     getting prednisone inj/epidural injections  . Kidney stone   . Tinnitus   . Cancer ([place])     basal cell carcinoma per right side under arm   . History of stomach ulcers   . Anorexia     HISTORY OF    Principal Problem:   Greater trochanteric bursitis of left hip Active Problems:   Trochanteric bursitis of left hip  Estimated body mass index is 22.05 kg/(m^2) as calculated from the following:   Height as  of this encounter: 5\' 5"  (1.651 m).   Weight as of this encounter: 60.102 kg (132 lb 8 oz). Discharge home Diet - Cardiac diet Follow up - in two weeks. Call office for appointment at [phone]. Activity - Weight bearing as tolerated to the surgical leg.  No active abduction of the leg, (no pulling leg out to the side away from the body).  Walker for first several days until comfortable ambulating. May start showering three days following surgery but do not submerge incision under water. Continue to use ice for pain and swelling from surgery.  Baby Aspirin 81 mg daily for three weeks.  Please use walker for the first couple of days until comfortable ambulating.  May start changing dressing tomorrow with dry gauze and tape.  Disposition - Home Condition Upon Discharge - Good D/C Meds - See DC Summary DVT Prophylaxis - Aspirin 81 mg daily for three weeks.  Arlee Muslim, PA-C Orthopaedic Surgery 11/08/2015, 8:34 AM

## 2015-11-08 NOTE — Discharge Instructions (Signed)
Dr. Gaynelle Arabian [facility] [address] [place], [place] [address] [phone]  Bursectomy / Gluteal Tendon Repair Discharge Instructions  HOME CARE INSTRUCTIONS  Remove items at home which could result in a fall. This includes throw rugs or furniture in walking pathways.   ICE to the affected hip every three hours for 30 minutes at a time and then as needed for pain and swelling.  Continue to use ice on the hip for pain and swelling from surgery. You may notice swelling that will progress down to the foot and ankle.  This is normal after surgery.  Elevate the leg when you are not up walking on it.    Continue to use the breathing machine which will help keep your temperature down.  It is common for your temperature to cycle up and down following surgery, especially at night when you are not up moving around and exerting yourself.  The breathing machine keeps your lungs expanded and your temperature down. Sit on high chairs which makes it easier to stand.  Sit on chairs with arms. Use the chair arms to help push yourself up when arising.   No Active Abduction of the leg (No pulling leg out to the side away from the body).  Use your walker for first several days until comfortable ambulating.  DIET You may resume your previous home diet once your are discharged from the hospital.  DRESSING / WOUND CARE / SHOWERING You may shower 3 days after surgery, but keep the wounds dry during showering.  You may use an occlusive plastic wrap (Press'n Seal for example), NO SOAKING/SUBMERGING IN THE BATHTUB.  If the bandage gets wet, change with a clean dry gauze.  If the incision gets wet, pat the wound dry with a clean towel. You may start showering three days following surgery but do not submerge the incision under water.Just pat the incision dry and apply a dry gauze dressing on daily. Change the surgical dressing daily and reapply a dry  dressing each time.  ACTIVITY Walk with your walker as instructed. Use walker as long as suggested by your caregivers. Avoid periods of inactivity such as sitting longer than an hour when not asleep. This helps prevent blood clots.  You may resume a sexual relationship in one month or when given the OK by your doctor.  You may return to work once you are cleared by your doctor.  Do not drive a car for 6 weeks or until released by you surgeon.  Do not drive while taking narcotics.  WEIGHT BEARING Weight bearing as tolerated with assist device (walker, cane, etc) as directed, use it as long as suggested by your surgeon or therapist, typically at least 4-6 weeks.  POSTOPERATIVE CONSTIPATION PROTOCOL Constipation - defined medically as fewer than three stools per week and severe constipation as less than one stool per week.  One of the most common issues patients have following surgery is constipation.  Even if you have a regular bowel pattern at home, your normal regimen is likely to be disrupted due to multiple reasons following surgery.  Combination of anesthesia, postoperative narcotics, change in appetite and fluid intake all can affect your bowels.  In order to avoid complications following surgery, here are some recommendations in order to help you during your recovery period.  Colace (docusate) - Pick up an over-the-counter form of Colace or another stool softener and take twice a day as long as you are requiring  postoperative pain medications.  Take with a full glass of water daily.  If you experience loose stools or diarrhea, hold the colace until you stool forms back up.  If your symptoms do not get better within 1 week or if they get worse, check with your doctor.  Dulcolax (bisacodyl) - Pick up over-the-counter and take as directed by the product packaging as needed to assist with the movement of your bowels.  Take with a full glass of water.  Use this product as needed if not relieved  by Colace only.   MiraLax (polyethylene glycol) - Pick up over-the-counter to have on hand.  MiraLax is a solution that will increase the amount of water in your bowels to assist with bowel movements.  Take as directed and can mix with a glass of water, juice, soda, coffee, or tea.  Take if you go more than two days without a movement. Do not use MiraLax more than once per day. Call your doctor if you are still constipated or irregular after using this medication for 7 days in a row.  If you continue to have problems with postoperative constipation, please contact the office for further assistance and recommendations.  If you experience "the worst abdominal pain ever" or develop nausea or vomiting, please contact the office immediatly for further recommendations for treatment.  ITCHING  If you experience itching with your medications, try taking only a single pain pill, or even half a pain pill at a time.  You can also use Benadryl over the counter for itching or also to help with sleep.   TED HOSE STOCKINGS Wear the elastic stockings on both legs for three weeks following surgery during the day but you may remove then at night for sleeping.  MEDICATIONS See your medication summary on the After Visit Summary that the nursing staff will review with you prior to discharge.  You may have some home medications which will be placed on hold until you complete the course of blood thinner medication.  It is important for you to complete the blood thinner medication as prescribed by your surgeon.  Continue your approved medications as instructed at time of discharge.  PRECAUTIONS If you experience chest pain or shortness of breath - call 911 immediately for transfer to the hospital emergency department.  If you develop a fever greater that 101 F, purulent drainage from wound, increased redness or drainage from wound, foul odor from the wound/dressing, or calf pain - CONTACT YOUR SURGEON.                                                    FOLLOW-UP APPOINTMENTS Make sure you keep all of your appointments after your operation with your surgeon and caregivers. You should call the office at the above phone number and make an appointment for approximately two weeks after the date of your surgery or on the date instructed by your surgeon outlined in the "After Visit Summary".   Pick up stool softner and laxative for home use following surgery while on pain medications. Do not submerge incision under water. Please use good hand washing techniques while changing dressing each day. May shower starting three days after surgery. Please use a clean towel to pat the incision dry following showers. Continue to use ice for pain and swelling after surgery. Do not use  any lotions or creams on the incision until instructed by your surgeon.  Take a baby 81 mg Aspirin daily for three weeks and then discontinue the Aspirin.

## 2018-04-04 ENCOUNTER — Ambulatory Visit (INDEPENDENT_AMBULATORY_CARE_PROVIDER_SITE_OTHER): Payer: Medicare Other

## 2018-04-04 ENCOUNTER — Other Ambulatory Visit: Payer: Self-pay | Admitting: Podiatry

## 2018-04-04 ENCOUNTER — Encounter: Payer: Self-pay | Admitting: Podiatry

## 2018-04-04 ENCOUNTER — Ambulatory Visit: Payer: Medicare Other | Admitting: Podiatry

## 2018-04-04 VITALS — BP 105/64 | HR 71 | Resp 16

## 2018-04-04 DIAGNOSIS — M79672 Pain in left foot: Principal | ICD-10-CM

## 2018-04-04 DIAGNOSIS — M722 Plantar fascial fibromatosis: Secondary | ICD-10-CM | POA: Diagnosis not present

## 2018-04-04 DIAGNOSIS — Q828 Other specified congenital malformations of skin: Secondary | ICD-10-CM | POA: Diagnosis not present

## 2018-04-04 DIAGNOSIS — M79671 Pain in right foot: Secondary | ICD-10-CM

## 2018-04-04 DIAGNOSIS — N951 Menopausal and female climacteric states: Secondary | ICD-10-CM | POA: Insufficient documentation

## 2018-04-04 MED ORDER — TRIAMCINOLONE ACETONIDE 10 MG/ML IJ SUSP
10.0000 mg | Freq: Once | INTRAMUSCULAR | Status: AC
  Administered 2018-04-04: 10 mg

## 2018-04-04 NOTE — Progress Notes (Signed)
   Subjective:    Patient ID: Alice [name], female    DOB: 1951-03-19, 67 y.o.   MRN: [id number]  HPI    Review of Systems  All other systems reviewed and are negative.      Objective:   Physical Exam        Assessment & Plan:

## 2018-04-07 NOTE — Progress Notes (Signed)
Subjective:   Patient ID: Alice [name], female   DOB: 67 y.o.   MRN: [id number]   HPI Patient presents stating that her heels have been bothering her quite a bit and she has a lesion on her foot that is very sore and makes it hard for her to walk.  She states that she is tried to trim it herself and patient does not smoke and likes to be active   Review of Systems  All other systems reviewed and are negative.       Objective:  Physical Exam  Constitutional: She appears well-developed and well-nourished.  Cardiovascular: Intact distal pulses.  Pulmonary/Chest: Effort normal.  Musculoskeletal: Normal range of motion.  Neurological: She is alert.  Skin: Skin is warm.  Nursing note and vitals reviewed.   Neurovascular status found to be intact muscle strength is adequate range of motion within normal limits with patient noted to have discomfort in the plantar heel region bilateral with inflammation fluid around the medial band and is noted to have lesion plantar aspect left foot that is painful when pressed and make walking difficult.  Patient is noted to have good digital perfusion well oriented x3     Assessment:  Plantar fasciitis bilateral heel with inflammation fluid around the medial band and lesion formation with pain     Plan:  H&P both conditions reviewed and today I injected the plantar fascial bilateral 3 mg Kenalog 5 mg Xylocaine and debrided lesion sharp sterile instrumentation with no iatrogenic bleeding and patient will be seen back to recheck in the next several weeks  X-rays indicate spur with no indications of stress fracture or advanced arthritis

## 2018-06-20 ENCOUNTER — Ambulatory Visit: Payer: BLUE CROSS/BLUE SHIELD | Admitting: Internal Medicine

## 2018-11-21 ENCOUNTER — Ambulatory Visit (HOSPITAL_COMMUNITY)
Admission: EM | Admit: 2018-11-21 | Discharge: 2018-11-21 | Disposition: A | Discharge status: Home / Self Care | Payer: Medicare Other | Attending: Internal Medicine | Admitting: Internal Medicine

## 2018-11-21 ENCOUNTER — Encounter (HOSPITAL_COMMUNITY): Payer: Self-pay | Admitting: Emergency Medicine

## 2018-11-21 DIAGNOSIS — J069 Acute upper respiratory infection, unspecified: Secondary | ICD-10-CM | POA: Insufficient documentation

## 2018-11-21 DIAGNOSIS — B9789 Other viral agents as the cause of diseases classified elsewhere: Secondary | ICD-10-CM | POA: Insufficient documentation

## 2018-11-21 MED ORDER — AZITHROMYCIN 250 MG PO TABS: 250.0000 mg | ORAL_TABLET | Freq: Every day | ORAL | 0 refills | Status: DC

## 2018-11-21 MED ORDER — PREDNISONE 20 MG PO TABS: 40.0000 mg | ORAL_TABLET | Freq: Every day | ORAL | 0 refills | Status: AC

## 2018-11-21 MED ORDER — FLUCONAZOLE 150 MG PO TABS: 150.0000 mg | ORAL_TABLET | Freq: Once | ORAL | 0 refills | Status: AC

## 2018-11-21 NOTE — Discharge Instructions (Signed)
Take medications as prescribed. Continue mucinex DM twice daily. May take tylenol or ibuprofen as needed for fevers/pain. Drink plenty of fluids.

## 2018-11-21 NOTE — ED Triage Notes (Signed)
Pt states she has been taking Mucinex-D at home without relief.

## 2018-11-21 NOTE — ED Provider Notes (Signed)
[facility]    CSN: [id number] Arrival date & time: 11/21/18  1508     History   Chief Complaint Chief Complaint  Patient presents with  . Cough    HPI Alice [name] is a 67 y.o. female.   Subjective:   History was provided by the patient.  Alice [name] is a 67 y.o. female who presents for evaluation of symptoms of a URI. Symptoms include chest congestion, headache, productive cough, sinus and nasal congestion and sore throat. Onset of symptoms was 1 week ago, unchanged since that time. She is drinking plenty of fluids. Evaluation to date: none. Treatment to date: cough suppressants  The following portions of the patient's history were reviewed and updated as appropriate: allergies, current medications, past family history, past medical history, past social history, past surgical history and problem list.        Past Medical History:  Diagnosis Date  . Anorexia    HISTORY OF   . Anxiety   . Cancer ([place])    basal cell carcinoma per right side under arm   . DDD (degenerative disc disease), lumbar   . Depression   . GERD (gastroesophageal reflux disease)   . H/O hiatal hernia   . History of stomach ulcers   . Hyperlipidemia   . Kidney stone   . Sciatica    getting prednisone inj/epidural injections  . Tinnitus     Patient Active Problem List   Diagnosis Date Noted  . Menopausal syndrome 04/04/2018  . Greater trochanteric bursitis of left hip 11/07/2015  . Trochanteric bursitis of left hip 11/07/2015  . Hydrosalpinx 09/19/2015  . Spinal stenosis 06/09/2015  . Esophageal reflux 07/23/2014  . Dysphagia 07/23/2014  . Musculoskeletal pain 09/22/2012  . Special screening for malignant neoplasms, colon 09/22/2012  . Atypical hyperplasia of breast 08/11/2012  . IRRITABLE BOWEL SYNDROME 03/21/2009  . Constipation 03/02/2009  . ABDOMINAL BLOATING 03/02/2009  . ABDOMINAL PAIN-EPIGASTRIC 03/02/2009  . ABDOMINAL PAIN-MULTIPLE SITES 03/02/2009  . COLONIC  POLYPS 03/01/2009  . DM 03/01/2009  . HYPERCHOLESTEROLEMIA 03/01/2009  . ANXIETY DEPRESSION 03/01/2009  . HYPERTENSION 03/01/2009  . ESOPHAGITIS 03/01/2009  . GASTRITIS, CHRONIC 03/01/2009  . NEPHROLITHIASIS 03/01/2009    Past Surgical History:  Procedure Laterality Date  . APPENDECTOMY  1959  . BACK SURGERY     with rods and screws placed in lumbar area   . BREAST BIOPSY Right    benign cyst  . COLONOSCOPY  2015   dilation of esophagus and polypectomy  . CYST EXCISION Right    foot  . CYSTOSCOPY KIDNEY W/ URETERAL GUIDE WIRE    . ELBOW SURGERY Left 2003  . EXCISION/RELEASE BURSA HIP Left 11/07/2015   Procedure: LEFT HIP BURSECTOMY WITH GLUTEAL TENDON REPAIR;  Surgeon: Gaynelle Arabian, MD;  Location: [facility];  Service: Orthopedics;  Laterality: Left;  . EYE SURGERY Right 2012   right-pterigium  . ROTATOR CUFF REPAIR Left 2003  . TONSILLECTOMY  1976  . UPPER GASTROINTESTINAL ENDOSCOPY  2015    OB History   No obstetric history on file.      Home Medications    Prior to Admission medications   Medication Sig Start Date End Date Taking? Authorizing Provider  Bepotastine Besilate (BEPREVE) 1.5 % SOLN Place 1 drop into both eyes daily as needed (burning or irritation).   Yes [provider]  clonazePAM (KLONOPIN) 1 MG tablet Take 1 mg by mouth at bedtime.   Yes [provider]  gabapentin (  NEURONTIN) 100 MG capsule Take 100 mg by mouth at bedtime. 10/05/15  Yes [provider]  pantoprazole (PROTONIX) 40 MG tablet Take 1 tablet (40 mg total) by mouth daily. 08/31/14  Yes Inda Castle, MD  sertraline (ZOLOFT) 50 MG tablet Take 50 mg by mouth at bedtime. Pt stated, "I only take a 1/2 tablet" 03/03/15  Yes [provider]  azithromycin (ZITHROMAX) 250 MG tablet Take 1 tablet (250 mg total) by mouth daily. Take first 2 tablets together, then 1 every day until finished. 11/21/18   Enrique Sack, FNP  chlordiazePOXIDE (LIBRIUM) 10 MG capsule  Take 10 mg by mouth daily as needed (stomach pain).     [provider]  diazepam (VALIUM) 5 MG tablet Take 1 tablet (5 mg total) by mouth every 6 (six) hours as needed for muscle spasms. Muscle spasms 11/08/15   Perkins, Alexzandrew L, PA-C  doxycycline (ORACEA) 40 MG capsule Take 40 mg by mouth See admin instructions. Take 1 capsule (40 mg) by mouth daily for 3 nights for rosacea breakouts    [provider]  Estradiol-Norethindrone Acet (ACTIVELLA) 0.5-0.1 MG tablet Activella 0.5 mg-0.1 mg tablet  Take 1 tablet every day by oral route for 90 days. 08/08/12   [provider]  fluconazole (DIFLUCAN) 150 MG tablet Take 1 tablet (150 mg total) by mouth once for 1 dose. 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  03/16/15   [provider]  meloxicam (MOBIC) 15 MG tablet Take 15 mg by mouth daily. 10/17/15   [provider]  metroNIDAZOLE (METROCREAM) 0.75 % cream Apply 1 application topically See admin instructions. Applies twice a day during rosacea breakouts 12/02/14   [provider]  metroNIDAZOLE (METROCREAM) 0.75 % cream metronidazole 0.75 % topical cream    [provider]  nystatin (MYCOSTATIN) 100000 UNIT/ML suspension Take 5-10 mLs by mouth 4 (four) times daily as needed (thrush caused by doxycycline). Swish, gargle and swallow    [provider]  Omega-3 Fatty Acids (FISH OIL PO) Fish Oil    [provider]  oxyCODONE (OXY IR/ROXICODONE) 5 MG immediate release tablet Take 1-2 tablets (5-10 mg total) by mouth every 3 (three) hours as needed for moderate pain or severe pain. 11/08/15   Perkins, Alexzandrew L, PA-C  Polyethyl Glycol-Propyl Glycol (SYSTANE OP) Place 1 drop into both eyes daily as needed (dry eyes).    [provider]  predniSONE (DELTASONE) 20 MG  tablet Take 2 tablets (40 mg total) by mouth daily for 5 days. 11/21/18 11/26/18  Enrique Sack, FNP  traMADol (ULTRAM) 50 MG tablet Take 1-2 tablets (50-100 mg total) by mouth every 6 (six) hours as needed (mild pain). 11/08/15   Perkins, Alexzandrew L, PA-C    Family History Family History  Problem Relation Age of Onset  . Breast cancer Mother   . Hyperlipidemia Mother        entire family  . Hypertension Mother   . Aneurysm Father        AAA  . Stroke Father   . Breast cancer Paternal Grandmother   . Hypertension Sister   . Hypertension Brother     Social History Social History   Tobacco Use  . Smoking status: Never Smoker  . Smokeless tobacco: Never Used  Substance Use Topics  . Alcohol use: No  . Drug use:  No     Allergies   Bactrim [sulfamethoxazole-trimethoprim]; Penicillins; Propoxyphene hcl; Statins; and Sulfa antibiotics   Review of Systems Review of Systems  Constitutional: Negative for fever.  HENT: Positive for congestion, rhinorrhea and sore throat.   Eyes: Negative.   Respiratory: Positive for cough. Negative for shortness of breath and wheezing.   Gastrointestinal: Negative.   Neurological: Positive for headaches.  All other systems reviewed and are negative.    Physical Exam Triage Vital Signs ED Triage Vitals [11/21/18 1529]  Enc Vitals Group     BP 119/74     Pulse Rate 72     Resp 18     Temp 98.1 F (36.7 C)     Temp Source Oral     SpO2 99 %     Weight      Height      Head Circumference      Peak Flow      Pain Score 7     Pain Loc      Pain Edu?      Excl. in [place]?    No data found.  Updated Vital Signs BP 119/74 (BP Location: Left Arm)   Pulse 72   Temp 98.1 F (36.7 C) (Oral)   Resp 18   SpO2 99%   Visual Acuity Right Eye Distance:   Left Eye Distance:   Bilateral Distance:    Right Eye Near:   Left Eye Near:    Bilateral Near:     Physical Exam Constitutional:      General: She is not in acute  distress.    Appearance: Normal appearance. She is not ill-appearing or toxic-appearing.  HENT:     Head: Normocephalic.     Right Ear: Tympanic membrane normal.     Left Ear: Tympanic membrane normal.     Nose: Nose normal.     Mouth/Throat:     Mouth: Mucous membranes are moist.  Eyes:     Extraocular Movements: Extraocular movements intact.     Conjunctiva/sclera: Conjunctivae normal.     Pupils: Pupils are equal, round, and reactive to light.  Neck:     Musculoskeletal: Normal range of motion and neck supple.  Cardiovascular:     Rate and Rhythm: Normal rate and regular rhythm.  Pulmonary:     Effort: Pulmonary effort is normal.     Breath sounds: Normal breath sounds.  Musculoskeletal: Normal range of motion.  Lymphadenopathy:     Cervical: No cervical adenopathy.  Skin:    General: Skin is warm and dry.  Neurological:     General: No focal deficit present.     Mental Status: She is alert and oriented to person, place, and time.  Psychiatric:        Mood and Affect: Mood normal.        Behavior: Behavior normal.      UC Treatments / Results  Labs (all labs ordered are listed, but only abnormal results are displayed) Labs Reviewed - No data to display  EKG None  Radiology No results found.  Procedures Procedures (including critical care time)  Medications Ordered in UC Medications - No data to display  Initial Impression / Assessment and Plan / UC Course  I have reviewed the triage vital signs and the nursing notes.  Pertinent labs & imaging results that were available during my care of the patient were reviewed by me and considered in my medical decision making (see chart for details).  67 year old female presenting with a one-week history of URI.  Afebrile.  Vital signs stable.  Nontoxic-appearing. Discussed diagnosis and treatment of URI. Suggested symptomatic OTC remedies. Follow up as needed.  Today's evaluation has revealed no signs of a  dangerous process. Discussed diagnosis with patient. Patient aware of their diagnosis, possible red flag symptoms to watch out for and need for close follow up. Patient understands verbal and written discharge instructions. Patient comfortable with plan and disposition.  Patient has a clear mental status at this time, good insight into illness (after discussion and teaching) and has clear judgment to make decisions regarding their care.  Documentation was completed with the aid of voice recognition software. Transcription may contain typographical errors. Final Clinical Impressions(s) / UC Diagnoses   Final diagnoses:  Viral URI with cough     Discharge Instructions     Take medications as prescribed. Continue mucinex DM twice daily. May take tylenol or ibuprofen as needed for fevers/pain. Drink plenty of fluids.    ED Prescriptions    Medication Sig Dispense Auth. Provider   azithromycin (ZITHROMAX) 250 MG tablet Take 1 tablet (250 mg total) by mouth daily. Take first 2 tablets together, then 1 every day until finished. 6 tablet Enrique Sack, FNP   predniSONE (DELTASONE) 20 MG tablet Take 2 tablets (40 mg total) by mouth daily for 5 days. 10 tablet Enrique Sack, FNP   fluconazole (DIFLUCAN) 150 MG tablet Take 1 tablet (150 mg total) by mouth once for 1 dose. Take one tablet on day three of antibiotics 1 tablet Enrique Sack, FNP     Controlled Substance Prescriptions  Controlled Substance Registry consulted? Not Applicable   Enrique Sack, [place] 11/21/18 [phone]

## 2018-11-21 NOTE — ED Triage Notes (Signed)
Pt presents to [facility] for assessment of nasal congestion, sore throat, and cough x 1 week.  Pt states she also felt her neck glands were swollen.

## 2019-02-05 ENCOUNTER — Ambulatory Visit (INDEPENDENT_AMBULATORY_CARE_PROVIDER_SITE_OTHER): Payer: Medicare Other

## 2019-02-05 ENCOUNTER — Encounter: Payer: Self-pay | Admitting: Podiatry

## 2019-02-05 ENCOUNTER — Ambulatory Visit: Payer: Medicare Other | Admitting: Podiatry

## 2019-02-05 ENCOUNTER — Other Ambulatory Visit: Payer: Self-pay | Admitting: Podiatry

## 2019-02-05 DIAGNOSIS — M79671 Pain in right foot: Secondary | ICD-10-CM

## 2019-02-05 DIAGNOSIS — M722 Plantar fascial fibromatosis: Secondary | ICD-10-CM

## 2019-02-05 DIAGNOSIS — M79672 Pain in left foot: Secondary | ICD-10-CM

## 2019-02-05 DIAGNOSIS — L600 Ingrowing nail: Secondary | ICD-10-CM

## 2019-02-05 DIAGNOSIS — B070 Plantar wart: Secondary | ICD-10-CM

## 2019-02-05 MED ORDER — TRIAMCINOLONE ACETONIDE 10 MG/ML IJ SUSP
10.0000 mg | Freq: Once | INTRAMUSCULAR | Status: AC
  Administered 2019-02-05: 10 mg

## 2019-02-05 MED ORDER — NEOMYCIN-POLYMYXIN-HC 3.5-10000-1 OT SOLN
OTIC | 0 refills | Status: DC
Start: 2019-02-05 — End: 2023-04-16

## 2019-02-05 NOTE — Patient Instructions (Signed)

## 2019-02-05 NOTE — Progress Notes (Signed)
Subjective:   Patient ID: Alice [name], female   DOB: 68 y.o.   MRN: [id number]   HPI Patient states the bottom of my heel left over right has been bothering me with lesions on the left and I have an ingrown toenail on my right that bothers me.  Patient states both of been ongoing   ROS      Objective:  Physical Exam  Neurovascular status intact with patient's left plantar lateral heel inflamed with keratotic tissue formation and ingrown toenail deformity right hallux lateral border that is painful when pressed     Assessment:  H&P conditions reviewed and at this point I did do a careful posterior lateral injection of the heel 3 mg Kenalog 5 mg Xylocaine debrided the lesions and to the ingrown toenail I recommended correction and I went ahead and explained procedure risk and I infiltrated the right hallux 60 mg like Marcaine mixture sterile prep applied and using sterile instrumentation remove the lateral border exposed matrix and applied phenol 3 applications 30 seconds followed by alcohol lavage sterile dressing.  Patient will be seen back to recheck     Plan:  Above describes the procedure that we did with patient having chronic ingrown toenail deformity and plantar fasciitis lesion formation  X-rays indicate there is small spur formation with no indications of stress fracture arthritis

## 2019-03-05 ENCOUNTER — Ambulatory Visit (HOSPITAL_COMMUNITY)
Admission: EM | Admit: 2019-03-05 | Discharge: 2019-03-05 | Disposition: A | Discharge status: Home / Self Care | Payer: Medicare Other | Attending: Family Medicine | Admitting: Family Medicine

## 2019-03-05 ENCOUNTER — Encounter: Payer: Self-pay | Admitting: Podiatry

## 2019-03-05 ENCOUNTER — Encounter (HOSPITAL_COMMUNITY): Payer: Self-pay

## 2019-03-05 ENCOUNTER — Other Ambulatory Visit: Payer: Self-pay

## 2019-03-05 ENCOUNTER — Ambulatory Visit: Payer: Medicare Other | Admitting: Podiatry

## 2019-03-05 ENCOUNTER — Ambulatory Visit (INDEPENDENT_AMBULATORY_CARE_PROVIDER_SITE_OTHER): Payer: Medicare Other | Admitting: Podiatry

## 2019-03-05 VITALS — Temp 97.3°F

## 2019-03-05 DIAGNOSIS — S0501XA Injury of conjunctiva and corneal abrasion without foreign body, right eye, initial encounter: Secondary | ICD-10-CM

## 2019-03-05 DIAGNOSIS — L600 Ingrowing nail: Secondary | ICD-10-CM | POA: Diagnosis not present

## 2019-03-05 DIAGNOSIS — B070 Plantar wart: Secondary | ICD-10-CM

## 2019-03-05 MED ORDER — TOBRAMYCIN 0.3 % OP SOLN: 1.0000 [drp] | OPHTHALMIC | 0 refills | Status: AC

## 2019-03-05 NOTE — ED Triage Notes (Signed)
Patient presents to Urgent Care with complaints of right eye pain since about 3 days ago. Patient states she gets recurrent styes and thinks that is what is causing her pain today, pt denies injury.

## 2019-03-05 NOTE — ED Provider Notes (Signed)
[facility]   [id number] 03/05/19 Arrival Time: 8921  CC: EYE discomfort  SUBJECTIVE:  N Pauletta Pickney is a 68 y.o. female who presents with complaint of eye discomfort x 3 days.  Denies a precipitating event, trauma, or personal contact with bacterial conjunctivitis.  States it feels like her eye is scratchy.  Has tried OTC eye drops without relief.  Symptoms are made worse with blinking.  Complains of FB sensation, and mild itching.  Denies discharge, vision changes, double vision, fever, chills, nausea, or vomiting.    Denies contact lens use.    ROS: As per HPI.  Past Medical History:  Diagnosis Date  . Anorexia    HISTORY OF   . Anxiety   . Cancer ([place])    basal cell carcinoma per right side under arm   . DDD (degenerative disc disease), lumbar   . Depression   . GERD (gastroesophageal reflux disease)   . H/O hiatal hernia   . History of stomach ulcers   . Hyperlipidemia   . Kidney stone   . Sciatica    getting prednisone inj/epidural injections  . Tinnitus    Past Surgical History:  Procedure Laterality Date  . APPENDECTOMY  1959  . BACK SURGERY     with rods and screws placed in lumbar area   . BREAST BIOPSY Right    benign cyst  . COLONOSCOPY  2015   dilation of esophagus and polypectomy  . CYST EXCISION Right    foot  . CYSTOSCOPY KIDNEY W/ URETERAL GUIDE WIRE    . ELBOW SURGERY Left 2003  . EXCISION/RELEASE BURSA HIP Left 11/07/2015   Procedure: LEFT HIP BURSECTOMY WITH GLUTEAL TENDON REPAIR;  Surgeon: Gaynelle Arabian, MD;  Location: [facility];  Service: Orthopedics;  Laterality: Left;  . EYE SURGERY Right 2012   right-pterigium  . ROTATOR CUFF REPAIR Left 2003  . TONSILLECTOMY  1976  . UPPER GASTROINTESTINAL ENDOSCOPY  2015   Allergies  Allergen Reactions  . Bactrim [Sulfamethoxazole-Trimethoprim] Other (See Comments)    Burns stomach  . Penicillins Other (See Comments)    Burns stomach Has patient had a PCN reaction causing immediate rash,  facial/tongue/throat swelling, SOB or lightheadedness with hypotension: No Has patient had a PCN reaction causing severe rash involving mucus membranes or skin necrosis: No Has patient had a PCN reaction that required hospitalization No Has patient had a PCN reaction occurring within the last 10 years: No If all of the above answers are "NO", then may proceed with Cephalosporin use.   Marland Kitchen Propoxyphene Hcl Nausea And Vomiting  . Statins Other (See Comments)    Burns stomach  . Sulfa Antibiotics Other (See Comments)    Burns stomach   No current facility-administered medications on file prior to encounter.    Current Outpatient Medications on File Prior to Encounter  Medication Sig Dispense Refill  . Bepotastine Besilate (BEPREVE) 1.5 % SOLN Place 1 drop into both eyes daily as needed (burning or irritation).    . chlordiazePOXIDE (LIBRIUM) 10 MG capsule Take 10 mg by mouth daily as needed (stomach pain).     . clonazePAM (KLONOPIN) 1 MG tablet Take 1 mg by mouth at bedtime.    Marland Kitchen doxycycline (ORACEA) 40 MG capsule Take 40 mg by mouth See admin instructions. Take 1 capsule (40 mg) by mouth daily for 3 nights for rosacea breakouts    . Estradiol-Norethindrone Acet (ACTIVELLA) 0.5-0.1 MG tablet Activella 0.5 mg-0.1 mg tablet  Take 1 tablet every day  by oral route for 90 days.    . 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   2  . gabapentin (NEURONTIN) 100 MG capsule Take 100 mg by mouth at bedtime.  5  . meloxicam (MOBIC) 15 MG tablet Take 15 mg by mouth daily.  2  . metroNIDAZOLE (METROCREAM) 0.75 % cream Apply 1 application topically See admin instructions. Applies twice a day during rosacea breakouts  6  . metroNIDAZOLE (METROCREAM) 0.75 % cream metronidazole 0.75 % topical cream    . neomycin-polymyxin-hydrocortisone (CORTISPORIN) OTIC solution Apply 1-2 drops to toe after soaking twice a day 10 mL 0  . nystatin (MYCOSTATIN) 100000  UNIT/ML suspension Take 5-10 mLs by mouth 4 (four) times daily as needed (thrush caused by doxycycline). Swish, gargle and swallow    . Omega-3 Fatty Acids (FISH OIL PO) Fish Oil    . pantoprazole (PROTONIX) 40 MG tablet Take 1 tablet (40 mg total) by mouth daily. 90 tablet 3  . Polyethyl Glycol-Propyl Glycol (SYSTANE OP) Place 1 drop into both eyes daily as needed (dry eyes).    . sertraline (ZOLOFT) 50 MG tablet Take 50 mg by mouth at bedtime. Pt stated, "I only take a 1/2 tablet"  0   Social History   Socioeconomic History  . Marital status: Single    Spouse name: Not on file  . Number of children: 0  . Years of education: Not on file  . Highest education level: Not on file  Occupational History  . 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 Financial resource strain: Not on file  . Food insecurity:    Worry: Not on file    Inability: Not on file  . Transportation needs:    Medical: Not on file    Non-medical: Not on file  Tobacco Use  . Smoking status: Never Smoker  . Smokeless tobacco: Never Used  Substance and Sexual Activity  . Alcohol use: No  . Drug use: No  . Sexual activity: Not on file  Lifestyle  . Physical activity:    Days per week: Not on file    Minutes per session: Not on file  . Stress: Not on file  Relationships  . Social connections:    Talks on phone: Not on file    Gets together: Not on file    Attends religious service: Not on file    Active member of club or organization: Not on file    Attends meetings of clubs or organizations: Not on file    Relationship status: Not on file  . Intimate partner violence:    Fear of current or ex partner: Not on file    Emotionally abused: Not on file    Physically abused: Not on file    Forced sexual activity: Not on file  Other Topics Concern  . Not on file  Social History Narrative  . Not on file   Family History  Problem Relation Age of Onset   . Breast cancer Mother   . Hyperlipidemia Mother        entire family  . Hypertension Mother   . Aneurysm Father        AAA  . Stroke Father   . Breast cancer Paternal Grandmother   . Hypertension Sister   . Hypertension Brother     OBJECTIVE:  Vitals:   03/05/19 1439  BP:  127/72  Pulse: 71  Resp: 18  Temp: 98.2 F (36.8 C)  TempSrc: Oral  SpO2: 100%    General appearance: alert; no distress Eyes: No conjunctival erythema. PERRL; EOMI without discomfort;  no obvious drainage; fluorescein uptake 6 o'clock position of RT cornea Neck: supple Lungs: clear to auscultation bilaterally Heart: regular rate and rhythm Skin: warm and dry Psychological: alert and cooperative; normal mood and affect   ASSESSMENT & PLAN:  1. Abrasion of right cornea, initial encounter     Meds ordered this encounter  Medications  . tobramycin (TOBREX) 0.3 % ophthalmic solution    Sig: Place 1-2 drops into the right eye every 4 (four) hours for 7 days.    Dispense:  5 mL    Refill:  0    Order Specific Question:   Supervising Provider    Answer:   Raylene Everts [[id number]]    Corneal abrasion Use tobrex as prescribed and to completion Use OTC systane or genteal gel eye drops at night as needed for symptomatic relief Use OTC ibuprofen or tylenol as needed for pain relief Return here or follow up with ophthamolgy if symptoms persists or worsen such as eye pain, vision changes, fever, chills, nausea, vomiting, eye discharge, eye swelling, etc..  Reviewed expectations re: course of current medical issues. Questions answered. Outlined signs and symptoms indicating need for more acute intervention. Patient verbalized understanding. After Visit Summary given.   Lestine Box, PA-C 03/05/19 1518

## 2019-03-05 NOTE — Progress Notes (Signed)
Subjective:   Patient ID: Alice [name], female   DOB: 68 y.o.   MRN: [id number]   HPI Patient presents concerned about correction of her ingrown throat right stating it is been bothering her on the side and also about the lesions on the bottom of the feet that at times can still be bothersome but have improved previously   ROS      Objective:  Physical Exam  Neurovascular status intact with patient's nail right showing some crusted tissue but overall healing well with mild discomfort upon deep palpation but no indications of pathology with lesions and I feel very well he will secondary to verruca plantaris     Assessment:  Overall doing well with no indications of acute pathology     Plan:  H&P condition reviewed and at this point I recommended continued soaks and I applied padding to the toe and I do not recommend treatment for the heel at this time

## 2019-03-05 NOTE — Discharge Instructions (Signed)
Use tobrex as prescribed and to completion Use OTC systane or genteal gel eye drops at night as needed for symptomatic relief Use OTC ibuprofen or tylenol as needed for pain relief Return here or follow up with ophthamolgy if symptoms persists or worsen such as eye pain, vision changes, fever, chills, nausea, vomiting, eye discharge, eye swelling, etc..

## 2019-06-09 ENCOUNTER — Ambulatory Visit (HOSPITAL_COMMUNITY)
Admission: EM | Admit: 2019-06-09 | Discharge: 2019-06-09 | Disposition: A | Discharge status: Home / Self Care | Payer: Medicare Other | Attending: Emergency Medicine | Admitting: Emergency Medicine

## 2019-06-09 ENCOUNTER — Other Ambulatory Visit: Payer: Self-pay

## 2019-06-09 ENCOUNTER — Encounter (HOSPITAL_COMMUNITY): Payer: Self-pay

## 2019-06-09 DIAGNOSIS — S0501XA Injury of conjunctiva and corneal abrasion without foreign body, right eye, initial encounter: Secondary | ICD-10-CM

## 2019-06-09 MED ORDER — FLUORESCEIN SODIUM 1 MG OP STRP
ORAL_STRIP | OPHTHALMIC | Status: AC
  Filled 2019-06-09: qty 1

## 2019-06-09 MED ORDER — CIPROFLOXACIN HCL 0.3 % OP SOLN: OPHTHALMIC | 0 refills | Status: DC

## 2019-06-09 MED ORDER — TETRACAINE HCL 0.5 % OP SOLN
OPHTHALMIC | Status: AC
  Filled 2019-06-09: qty 4

## 2019-06-09 MED ORDER — SYSTANE 0.4-0.3 % OP SOLN: 1.0000 [drp] | Freq: Four times a day (QID) | OPHTHALMIC | 0 refills | Status: DC | PRN

## 2019-06-09 NOTE — ED Triage Notes (Signed)
Pt states she thinks she stretched her right eye trying to adjust her mask on her face.

## 2019-06-09 NOTE — Discharge Instructions (Addendum)
You have a scratch of the eye on the cornea (the clear part of the eye). This condition may be caused by trauma. It is a common problem for people who wear contact lenses. Proper treatment is important. No evidence of infection is noted today, but you could develop an infection called a corneal ulcer or have some retained foreign body that may or may not have been noted today in the ED. Ulcers are not only painful, but they may also scar the cornea and cause permanent damage to the eye.  Do not rub your eyes. You may use Systane or artifical tears as much as you want to for comfort. Wear sunglasses if lights are hurting your eyes.If you wear contact lenses, do not use them until your eye caregiver approves.  Follow-up care is necessary to be sure the corneal abrasion is healing if not completely resolved in 2-3 days. See your caregiver or eye specialist as suggested for followup if you are not feeling better in several days.  Go to www.goodrx.com to look up your medications. This will give you a list of where you can find your prescriptions at the most affordable prices. Or ask the pharmacist what the cash price is, or if they have any other discount programs available to help make your medication more affordable. This can be less expensive than what you would pay with insurance.

## 2019-06-09 NOTE — ED Provider Notes (Signed)
HPI  SUBJECTIVE:  Alice [name] is a 68 y.o. female who presents with 3 days of right eye pain/foreign body sensation.  States that she thinks that she scratched her eye with a mask versus being out in the yard and getting debris in her eye.  She reports burning eye pain and states her eye feels swollen.  No vomiting, headache, photophobia, halos around lights, discharge,  conjunctival injection, eye pain worse in the dark.  She does not wear contacts.  She wears reading glasses.  She tried Visine allergy eyedrops which made her symptoms worse, symptoms are better when she closes her eyes.  She states this feels identical to previous corneal abrasion.  Past medical history negative for diabetes, glaucoma.  Tetanus is up-to-date.    Past Medical History:  Diagnosis Date  . Anorexia    HISTORY OF   . Anxiety   . Cancer ([place])    basal cell carcinoma per right side under arm   . DDD (degenerative disc disease), lumbar   . Depression   . GERD (gastroesophageal reflux disease)   . H/O hiatal hernia   . History of stomach ulcers   . Hyperlipidemia   . Kidney stone   . Sciatica    getting prednisone inj/epidural injections  . Tinnitus     Past Surgical History:  Procedure Laterality Date  . APPENDECTOMY  1959  . BACK SURGERY     with rods and screws placed in lumbar area   . BREAST BIOPSY Right    benign cyst  . COLONOSCOPY  2015   dilation of esophagus and polypectomy  . CYST EXCISION Right    foot  . CYSTOSCOPY KIDNEY W/ URETERAL GUIDE WIRE    . ELBOW SURGERY Left 2003  . EXCISION/RELEASE BURSA HIP Left 11/07/2015   Procedure: LEFT HIP BURSECTOMY WITH GLUTEAL TENDON REPAIR;  Surgeon: Gaynelle Arabian, MD;  Location: [facility];  Service: Orthopedics;  Laterality: Left;  . EYE SURGERY Right 2012   right-pterigium  . ROTATOR CUFF REPAIR Left 2003  . TONSILLECTOMY  1976  . UPPER GASTROINTESTINAL ENDOSCOPY  2015    Family History  Problem Relation Age of Onset  . Breast cancer  Mother   . Hyperlipidemia Mother        entire family  . Hypertension Mother   . Aneurysm Father        AAA  . Stroke Father   . Breast cancer Paternal Grandmother   . Hypertension Sister   . Hypertension Brother     Social History   Tobacco Use  . Smoking status: Never Smoker  . Smokeless tobacco: Never Used  Substance Use Topics  . Alcohol use: No  . Drug use: No    No current facility-administered medications for this encounter.   Current Outpatient Medications:  .  Bepotastine Besilate (BEPREVE) 1.5 % SOLN, Place 1 drop into both eyes daily as needed (burning or irritation)., Disp: , Rfl:  .  chlordiazePOXIDE (LIBRIUM) 10 MG capsule, Take 10 mg by mouth daily as needed (stomach pain). , Disp: , Rfl:  .  ciprofloxacin (CILOXAN) 0.3 % ophthalmic solution, 1-2 drops in affected eye 4 times/day x 5 days, Disp: 5 mL, Rfl: 0 .  clonazePAM (KLONOPIN) 1 MG tablet, Take 1 mg by mouth at bedtime., Disp: , Rfl:  .  doxycycline (ORACEA) 40 MG capsule, Take 40 mg by mouth See admin instructions. Take 1 capsule (40 mg) by mouth daily for 3 nights for rosacea breakouts, Disp: ,  Rfl:  .  Estradiol-Norethindrone Acet (ACTIVELLA) 0.5-0.1 MG tablet, Activella 0.5 mg-0.1 mg tablet  Take 1 tablet every day by oral route for 90 days., Disp: , Rfl:  .  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 , Disp: , Rfl: 2 .  gabapentin (NEURONTIN) 100 MG capsule, Take 100 mg by mouth at bedtime., Disp: , Rfl: 5 .  meloxicam (MOBIC) 15 MG tablet, Take 15 mg by mouth daily., Disp: , Rfl: 2 .  metroNIDAZOLE (METROCREAM) 0.75 % cream, Apply 1 application topically See admin instructions. Applies twice a day during rosacea breakouts, Disp: , Rfl: 6 .  metroNIDAZOLE (METROCREAM) 0.75 % cream, metronidazole 0.75 % topical cream, Disp: , Rfl:  .  neomycin-polymyxin-hydrocortisone (CORTISPORIN) OTIC solution, Apply 1-2 drops to toe after soaking twice a day,  Disp: 10 mL, Rfl: 0 .  nystatin (MYCOSTATIN) 100000 UNIT/ML suspension, Take 5-10 mLs by mouth 4 (four) times daily as needed (thrush caused by doxycycline). Swish, gargle and swallow, Disp: , Rfl:  .  Omega-3 Fatty Acids (FISH OIL PO), Fish Oil, Disp: , Rfl:  .  pantoprazole (PROTONIX) 40 MG tablet, Take 1 tablet (40 mg total) by mouth daily., Disp: 90 tablet, Rfl: 3 .  Polyethyl Glycol-Propyl Glycol (SYSTANE OP), Place 1 drop into both eyes daily as needed (dry eyes)., Disp: , Rfl:  .  Polyethyl Glycol-Propyl Glycol (SYSTANE) 0.4-0.3 % SOLN, Apply 1 drop to eye 4 (four) times daily as needed., Disp: 5 mL, Rfl: 0 .  sertraline (ZOLOFT) 50 MG tablet, Take 50 mg by mouth at bedtime. Pt stated, "I only take a 1/2 tablet", Disp: , Rfl: 0  Allergies  Allergen Reactions  . Bactrim [Sulfamethoxazole-Trimethoprim] Other (See Comments)    Burns stomach  . Penicillins Other (See Comments)    Burns stomach Has patient had a PCN reaction causing immediate rash, facial/tongue/throat swelling, SOB or lightheadedness with hypotension: No Has patient had a PCN reaction causing severe rash involving mucus membranes or skin necrosis: No Has patient had a PCN reaction that required hospitalization No Has patient had a PCN reaction occurring within the last 10 years: No If all of the above answers are "NO", then may proceed with Cephalosporin use.   Marland Kitchen Propoxyphene Hcl Nausea And Vomiting  . Statins Other (See Comments)    Burns stomach  . Sulfa Antibiotics Other (See Comments)    Burns stomach     ROS  As noted in HPI.   Physical Exam  BP 133/64 (BP Location: Right Arm)   Pulse 62   Temp 98.5 F (36.9 C)   Resp 18   Wt 68.9 kg   SpO2 100%   BMI 25.29 kg/m   Constitutional: Well developed, well nourished, no acute distress Eyes:  EOMI, conjunctiva normal bilaterally.  PERRLA, no direct or consensual photophobia.  No crusting, drainage right eye.  No foreign body seen on lid eversion.   Positive peripheral corneal abrasion in the 5 o'clock position.  Negative Seidel.  Negative hyphema.  No corneal foreign body.  No corneal ulcer.  No periorbital erythema, edema.    Visual Acuity  Right Eye Distance: 20/30 Left Eye Distance: 20/40 Bilateral Distance: 20/30  Right Eye Near:   Left Eye Near:    Bilateral Near:      HENT: Normocephalic, atraumatic,mucus membranes moist Respiratory: Normal inspiratory effort Cardiovascular: Normal rate GI: nondistended skin: No rash, skin intact Musculoskeletal: no deformities Neurologic: Alert & oriented x 3,  no focal neuro deficits Psychiatric: Speech and behavior appropriate   ED Course   Medications  tetracaine (PONTOCAINE) 0.5 % ophthalmic solution (has no administration in time range)  fluorescein 1 MG ophthalmic strip (has no administration in time range)    Orders Placed This Encounter  Procedures  . Visual acuity screening    Standing Status:   Standing    Number of Occurrences:   1    No results found for this or any previous visit (from the past 24 hour(s)). No results found.  ED Clinical Impression  1. Abrasion of right cornea, initial encounter      ED Assessment/Plan  Patient was seen here in April for right-sided corneal abrasion.  Patient has another corneal abrasion.  Her tetanus is up-to-date.  Vision right eye is better than the left.  Home with Cipro eyedrops, Systane.  She is to follow-up with [facility] if not feeling better in 2 to 3 days.  Discussed MDM, treatment plan, and plan for follow-up with patient. . patient agrees with plan.   Meds ordered this encounter  Medications  . Polyethyl Glycol-Propyl Glycol (SYSTANE) 0.4-0.3 % SOLN    Sig: Apply 1 drop to eye 4 (four) times daily as needed.    Dispense:  5 mL    Refill:  0  . ciprofloxacin (CILOXAN) 0.3 % ophthalmic solution    Sig: 1-2 drops in affected eye 4 times/day x 5 days    Dispense:  5 mL    Refill:  0    *This  clinic note was created using [employer]. Therefore, there may be occasional mistakes despite careful proofreading.   ?   Melynda Ripple, MD 06/09/19 2111

## 2019-07-03 ENCOUNTER — Other Ambulatory Visit: Payer: Self-pay

## 2019-07-03 ENCOUNTER — Ambulatory Visit (INDEPENDENT_AMBULATORY_CARE_PROVIDER_SITE_OTHER): Payer: Medicare Other

## 2019-07-03 ENCOUNTER — Encounter (HOSPITAL_COMMUNITY): Payer: Self-pay | Admitting: Emergency Medicine

## 2019-07-03 ENCOUNTER — Ambulatory Visit (HOSPITAL_COMMUNITY)
Admission: EM | Admit: 2019-07-03 | Discharge: 2019-07-03 | Disposition: A | Discharge status: Home / Self Care | Payer: Medicare Other | Attending: Family Medicine | Admitting: Family Medicine

## 2019-07-03 DIAGNOSIS — S92352A Displaced fracture of fifth metatarsal bone, left foot, initial encounter for closed fracture: Secondary | ICD-10-CM

## 2019-07-03 DIAGNOSIS — S92355A Nondisplaced fracture of fifth metatarsal bone, left foot, initial encounter for closed fracture: Secondary | ICD-10-CM | POA: Diagnosis not present

## 2019-07-03 NOTE — ED Triage Notes (Signed)
Pt presents to [facility] for assessment after tripping and falling onto the access ramp her brother made at her home where she lives with her mother.  Patient states she fell on to the left side, left foot bruising and swelling noted.  Patient also c/o "lump" to right calf.  C/o head injury, denies LOC, denies blood thinners.

## 2019-07-03 NOTE — Discharge Instructions (Addendum)
Boot at all times, may weight-bear as tolerated. Recommend that you use crutches. Need to follow-up with Ortho within 1 week. Return for worsening pain, swelling, numbness in your foot.

## 2019-07-03 NOTE — ED Provider Notes (Signed)
[facility]    CSN: [id number] Arrival date & time: 07/03/19  2595     History   Chief Complaint Chief Complaint  Patient presents with  . Fall  . Foot Pain    HPI Alice [name] is a 68 y.o. female with history of basal cell carcinoma, DDD of lumbar spine presenting for left foot pain and bruising status post fall at 9 PM yesterday.  Patient states that she was emptying her mom's trash, walking down a disability ramp when she fell, twisting her foot.  Patient denies head trauma, LOC.  No other areas of concern.  Patient was able to put ice, take ibuprofen with moderate relief of pain.  Past Medical History:  Diagnosis Date  . Anorexia    HISTORY OF   . Anxiety   . Cancer ([place])    basal cell carcinoma per right side under arm   . DDD (degenerative disc disease), lumbar   . Depression   . GERD (gastroesophageal reflux disease)   . H/O hiatal hernia   . History of stomach ulcers   . Hyperlipidemia   . Kidney stone   . Sciatica    getting prednisone inj/epidural injections  . Tinnitus     Patient Active Problem List   Diagnosis Date Noted  . Menopausal syndrome 04/04/2018  . Greater trochanteric bursitis of left hip 11/07/2015  . Trochanteric bursitis of left hip 11/07/2015  . Hydrosalpinx 09/19/2015  . Spinal stenosis 06/09/2015  . Esophageal reflux 07/23/2014  . Dysphagia 07/23/2014  . Musculoskeletal pain 09/22/2012  . Special screening for malignant neoplasms, colon 09/22/2012  . Atypical hyperplasia of breast 08/11/2012  . IRRITABLE BOWEL SYNDROME 03/21/2009  . Constipation 03/02/2009  . ABDOMINAL BLOATING 03/02/2009  . ABDOMINAL PAIN-EPIGASTRIC 03/02/2009  . ABDOMINAL PAIN-MULTIPLE SITES 03/02/2009  . COLONIC POLYPS 03/01/2009  . DM 03/01/2009  . HYPERCHOLESTEROLEMIA 03/01/2009  . ANXIETY DEPRESSION 03/01/2009  . HYPERTENSION 03/01/2009  . ESOPHAGITIS 03/01/2009  . GASTRITIS, CHRONIC 03/01/2009  . 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 APPENDECTOMY  1959  . BACK SURGERY     with rods and screws placed in lumbar area   . BREAST BIOPSY Right    benign cyst  . COLONOSCOPY  2015   dilation of esophagus and polypectomy  . CYST EXCISION Right    foot  . CYSTOSCOPY KIDNEY W/ URETERAL GUIDE WIRE    . ELBOW SURGERY Left 2003  . EXCISION/RELEASE BURSA HIP Left 11/07/2015   Procedure: LEFT HIP BURSECTOMY WITH GLUTEAL TENDON REPAIR;  Surgeon: Gaynelle Arabian, MD;  Location: [facility];  Service: Orthopedics;  Laterality: Left;  . EYE SURGERY Right 2012   right-pterigium  . ROTATOR CUFF REPAIR Left 2003  . TONSILLECTOMY  1976  . UPPER GASTROINTESTINAL ENDOSCOPY  2015    OB History   No obstetric history on file.      Home Medications    Prior to Admission medications   Medication Sig Start Date End Date Taking? Authorizing Provider  Bepotastine Besilate (BEPREVE) 1.5 % SOLN Place 1 drop into both eyes daily as needed (burning or irritation).    [provider]  chlordiazePOXIDE (LIBRIUM) 10 MG capsule Take 10 mg by mouth daily as needed (stomach pain).     [provider]  ciprofloxacin (CILOXAN) 0.3 % ophthalmic solution 1-2 drops in affected eye 4 times/day x 5 days 06/09/19   Melynda Ripple, MD  clonazePAM (KLONOPIN) 1 MG tablet Take  1 mg by mouth at bedtime.    [provider]  doxycycline (ORACEA) 40 MG capsule Take 40 mg by mouth See admin instructions. Take 1 capsule (40 mg) by mouth daily for 3 nights for rosacea breakouts    [provider]  Estradiol-Norethindrone Acet (ACTIVELLA) 0.5-0.1 MG tablet Activella 0.5 mg-0.1 mg tablet  Take 1 tablet every day by oral route for 90 days. 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  03/16/15   [provider]  gabapentin (NEURONTIN) 100 MG capsule Take 100 mg by mouth at bedtime. 10/05/15    [provider]  meloxicam (MOBIC) 15 MG tablet Take 15 mg by mouth daily. 10/17/15   [provider]  metroNIDAZOLE (METROCREAM) 0.75 % cream Apply 1 application topically See admin instructions. Applies twice a day during rosacea breakouts 12/02/14   [provider]  metroNIDAZOLE (METROCREAM) 0.75 % cream metronidazole 0.75 % topical cream    [provider]  neomycin-polymyxin-hydrocortisone (CORTISPORIN) OTIC solution Apply 1-2 drops to toe after soaking twice a day 02/05/19   Wallene Huh, DPM  nystatin (MYCOSTATIN) 100000 UNIT/ML suspension Take 5-10 mLs by mouth 4 (four) times daily as needed (thrush caused by doxycycline). Swish, gargle and swallow    [provider]  Omega-3 Fatty Acids (FISH OIL PO) Fish Oil    [provider]  pantoprazole (PROTONIX) 40 MG tablet Take 1 tablet (40 mg total) by mouth daily. 08/31/14   Inda Castle, MD  Polyethyl Glycol-Propyl Glycol (SYSTANE OP) Place 1 drop into both eyes daily as needed (dry eyes).    [provider]  Polyethyl Glycol-Propyl Glycol (SYSTANE) 0.4-0.3 % SOLN Apply 1 drop to eye 4 (four) times daily as needed. 06/09/19   Melynda Ripple, MD  sertraline (ZOLOFT) 50 MG tablet Take 50 mg by mouth at bedtime. Pt stated, "I only take a 1/2 tablet" 03/03/15   [provider]    Family History Family History  Problem Relation Age of Onset  . Breast cancer Mother   . Hyperlipidemia Mother        entire family  . Hypertension Mother   . Aneurysm Father        AAA  . Stroke Father   . Breast cancer Paternal Grandmother   . Hypertension Sister   . Hypertension Brother     Social History Social History   Tobacco Use  . Smoking status: Never Smoker  . Smokeless tobacco: Never Used  Substance Use Topics  . Alcohol use: No  . Drug use: No     Allergies   Bactrim [sulfamethoxazole-trimethoprim], Penicillins, Propoxyphene hcl, Statins, and Sulfa antibiotics    Review of Systems Review of Systems  Constitutional: Negative for fatigue and fever.  Eyes: Negative for pain, redness and visual disturbance.  Respiratory: Negative for cough and shortness of breath.   Cardiovascular: Negative for chest pain and palpitations.  Gastrointestinal: Negative for abdominal pain, diarrhea and vomiting.  Musculoskeletal: Positive for arthralgias and gait problem. Negative for myalgias.  Skin: Negative for rash and wound.  Neurological: Negative for dizziness, tremors, seizures, syncope, facial asymmetry, speech difficulty, weakness, light-headedness, numbness and headaches.  Psychiatric/Behavioral: Negative for agitation and confusion.     Physical Exam Triage Vital Signs ED Triage Vitals  Enc Vitals Group     BP      Pulse      Resp  Temp      Temp src      SpO2      Weight      Height      Head Circumference      Peak Flow      Pain Score      Pain Loc      Pain Edu?      Excl. in [place]?    No data found.  Updated Vital Signs BP 107/69 (BP Location: Right Arm)   Pulse 64   Temp 97.7 F (36.5 C)   Resp 16   SpO2 97%   Visual Acuity Right Eye Distance:   Left Eye Distance:   Bilateral Distance:    Right Eye Near:   Left Eye Near:    Bilateral Near:     Physical Exam Constitutional:      General: She is not in acute distress. HENT:     Head: Normocephalic and atraumatic.  Eyes:     General: No scleral icterus.    Pupils: Pupils are equal, round, and reactive to light.  Cardiovascular:     Rate and Rhythm: Normal rate.  Pulmonary:     Effort: Pulmonary effort is normal.  Musculoskeletal:     Comments: Left foot with minimal swelling, moderate ecchymosis on dorsal lateral aspect.  Tender palpation throughout lateral aspect of foot.  No medial or lateral malleoli tenderness.  Patient has FROM of ankle joint.  Patient able to wiggle toes.  NVI  Skin:    Coloration: Skin is not jaundiced or pale.  Neurological:     Mental  Status: She is alert and oriented to person, place, and time.      UC Treatments / Results  Labs (all labs ordered are listed, but only abnormal results are displayed) Labs Reviewed - No data to display  EKG   Radiology Dg Foot Complete Left  Result Date: 07/03/2019 CLINICAL DATA:  Pain, swelling, bruising.  Fall. EXAM: LEFT FOOT - COMPLETE 3+ VIEW COMPARISON:  02/05/2019. FINDINGS: Displaced oblique fracture of the distal left fifth metatarsal noted. No other focal abnormality identified. No evidence of fracture or dislocation. No radiopaque foreign body. IMPRESSION: Displaced oblique fracture of the distal left fifth metatarsal. Electronically Signed   By: Marcello Moores  Register   On: 07/03/2019 09:07    Procedures Procedures (including critical care time)  Medications Ordered in UC Medications - No data to display  Initial Impression / Assessment and Plan / UC Course  I have reviewed the triage vital signs and the nursing notes.  Pertinent labs & imaging results that were available during my care of the patient were reviewed by me and considered in my medical decision making (see chart for details).     1.  Displaced oblique fracture of the distal aspect of left fifth metatarsal X-ray done in office, consulted with Ortho Hilbert Odor, PA-C): Patient given postop boot and crutches in office which she tolerated well.  Will follow-up with Ortho within a week per Mr. Bonnee Quin recommendations.  Return precautions discussed, patient verbalized understanding and is agreeable to plan. Final Clinical Impressions(s) / UC Diagnoses   Final diagnoses:  Displaced fracture of fifth metatarsal bone, left foot, initial encounter for closed fracture     Discharge Instructions     Boot at all times, may weight-bear as tolerated. Recommend that you use crutches. Need to follow-up with Ortho within 1 week. Return for worsening pain, swelling, numbness in your foot.    ED Prescriptions  None     Controlled Substance Prescriptions [place] Controlled Substance Registry consulted? Not Applicable   Quincy Sheehan, [place] 07/03/19 3893

## 2019-07-08 DIAGNOSIS — S92353A Displaced fracture of fifth metatarsal bone, unspecified foot, initial encounter for closed fracture: Secondary | ICD-10-CM | POA: Insufficient documentation

## 2019-07-14 DIAGNOSIS — M79672 Pain in left foot: Secondary | ICD-10-CM | POA: Insufficient documentation

## 2019-08-14 ENCOUNTER — Encounter: Payer: Self-pay | Admitting: Emergency Medicine

## 2019-08-14 ENCOUNTER — Ambulatory Visit
Admission: EM | Admit: 2019-08-14 | Discharge: 2019-08-14 | Disposition: A | Discharge status: Home / Self Care | Payer: Medicare Other | Attending: Emergency Medicine | Admitting: Emergency Medicine

## 2019-08-14 ENCOUNTER — Other Ambulatory Visit: Payer: Self-pay

## 2019-08-14 DIAGNOSIS — L237 Allergic contact dermatitis due to plants, except food: Secondary | ICD-10-CM

## 2019-08-14 MED ORDER — METHYLPREDNISOLONE SODIUM SUCC 125 MG IJ SOLR
125.0000 mg | Freq: Once | INTRAMUSCULAR | Status: AC
  Administered 2019-08-14: 17:00:00 125 mg via INTRAMUSCULAR

## 2019-08-14 MED ORDER — TRIAMCINOLONE ACETONIDE 0.5 % EX OINT: 1.0000 "application " | TOPICAL_OINTMENT | Freq: Two times a day (BID) | CUTANEOUS | 0 refills | Status: DC

## 2019-08-14 NOTE — ED Triage Notes (Signed)
Pt presents for assessment of rash

## 2019-08-14 NOTE — ED Notes (Signed)
Patient able to ambulate independently  

## 2019-08-14 NOTE — ED Provider Notes (Signed)
[name]    CSN: [id number] Arrival date & time: 08/14/19  1628      History   Chief Complaint Chief Complaint  Patient presents with  . Rash    HPI Alice [name] is a 68 y.o. female presenting for rash on distal upper and lower extremities since last week.  Patient states that she was working near [employer] [facility], fell in the water where there were vines, and was exposed to poison ivy.  Patient has had similar eruption in the past that resolved with single "steroid shot".  Patient has tried calamine, cortisone, Benadryl with moderate relief of symptoms, though states "just not kicking it ".   Past Medical History:  Diagnosis Date  . Anorexia    HISTORY OF   . Anxiety   . Cancer ([place])    basal cell carcinoma per right side under arm   . DDD (degenerative disc disease), lumbar   . Depression   . GERD (gastroesophageal reflux disease)   . H/O hiatal hernia   . History of stomach ulcers   . Hyperlipidemia   . Kidney stone   . Sciatica    getting prednisone inj/epidural injections  . Tinnitus     Patient Active Problem List   Diagnosis Date Noted  . Menopausal syndrome 04/04/2018  . Greater trochanteric bursitis of left hip 11/07/2015  . Trochanteric bursitis of left hip 11/07/2015  . Hydrosalpinx 09/19/2015  . Spinal stenosis 06/09/2015  . Esophageal reflux 07/23/2014  . Dysphagia 07/23/2014  . Musculoskeletal pain 09/22/2012  . Special screening for malignant neoplasms, colon 09/22/2012  . Atypical hyperplasia of breast 08/11/2012  . IRRITABLE BOWEL SYNDROME 03/21/2009  . Constipation 03/02/2009  . ABDOMINAL BLOATING 03/02/2009  . ABDOMINAL PAIN-EPIGASTRIC 03/02/2009  . ABDOMINAL PAIN-MULTIPLE SITES 03/02/2009  . COLONIC POLYPS 03/01/2009  . DM 03/01/2009  . HYPERCHOLESTEROLEMIA 03/01/2009  . ANXIETY DEPRESSION 03/01/2009  . HYPERTENSION 03/01/2009  . ESOPHAGITIS 03/01/2009  . GASTRITIS, CHRONIC 03/01/2009  . 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 APPENDECTOMY  1959  . BACK SURGERY     with rods and screws placed in lumbar area   . BREAST BIOPSY Right    benign cyst  . COLONOSCOPY  2015   dilation of esophagus and polypectomy  . CYST EXCISION Right    foot  . CYSTOSCOPY KIDNEY W/ URETERAL GUIDE WIRE    . ELBOW SURGERY Left 2003  . EXCISION/RELEASE BURSA HIP Left 11/07/2015   Procedure: LEFT HIP BURSECTOMY WITH GLUTEAL TENDON REPAIR;  Surgeon: Gaynelle Arabian, MD;  Location: [facility];  Service: Orthopedics;  Laterality: Left;  . EYE SURGERY Right 2012   right-pterigium  . ROTATOR CUFF REPAIR Left 2003  . TONSILLECTOMY  1976  . UPPER GASTROINTESTINAL ENDOSCOPY  2015    OB History   No obstetric history on file.      Home Medications    Prior to Admission medications   Medication Sig Start Date End Date Taking? Authorizing Provider  Bepotastine Besilate (BEPREVE) 1.5 % SOLN Place 1 drop into both eyes daily as needed (burning or irritation).    [provider]  chlordiazePOXIDE (LIBRIUM) 10 MG capsule Take 10 mg by mouth daily as needed (stomach pain).     [provider]  ciprofloxacin (CILOXAN) 0.3 % ophthalmic solution 1-2 drops in affected eye 4 times/day x 5 days 06/09/19   Melynda Ripple, MD  clonazePAM (KLONOPIN) 1 MG tablet Take 1 mg by mouth  at bedtime.    [provider]  doxycycline (ORACEA) 40 MG capsule Take 40 mg by mouth See admin instructions. Take 1 capsule (40 mg) by mouth daily for 3 nights for rosacea breakouts    [provider]  Estradiol-Norethindrone Acet (ACTIVELLA) 0.5-0.1 MG tablet Activella 0.5 mg-0.1 mg tablet  Take 1 tablet every day by oral route for 90 days. 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  03/16/15   [provider]  gabapentin (NEURONTIN) 100 MG capsule Take 100 mg by mouth at bedtime. 10/05/15    [provider]  meloxicam (MOBIC) 15 MG tablet Take 15 mg by mouth daily. 10/17/15   [provider]  metroNIDAZOLE (METROCREAM) 0.75 % cream Apply 1 application topically See admin instructions. Applies twice a day during rosacea breakouts 12/02/14   [provider]  metroNIDAZOLE (METROCREAM) 0.75 % cream metronidazole 0.75 % topical cream    [provider]  neomycin-polymyxin-hydrocortisone (CORTISPORIN) OTIC solution Apply 1-2 drops to toe after soaking twice a day 02/05/19   Wallene Huh, DPM  nystatin (MYCOSTATIN) 100000 UNIT/ML suspension Take 5-10 mLs by mouth 4 (four) times daily as needed (thrush caused by doxycycline). Swish, gargle and swallow    [provider]  Omega-3 Fatty Acids (FISH OIL PO) Fish Oil    [provider]  pantoprazole (PROTONIX) 40 MG tablet Take 1 tablet (40 mg total) by mouth daily. 08/31/14   Inda Castle, MD  Polyethyl Glycol-Propyl Glycol (SYSTANE OP) Place 1 drop into both eyes daily as needed (dry eyes).    [provider]  Polyethyl Glycol-Propyl Glycol (SYSTANE) 0.4-0.3 % SOLN Apply 1 drop to eye 4 (four) times daily as needed. 06/09/19   Melynda Ripple, MD  sertraline (ZOLOFT) 50 MG tablet Take 50 mg by mouth at bedtime. Pt stated, "I only take a 1/2 tablet" 03/03/15   [provider]  triamcinolone ointment (KENALOG) 0.5 % Apply 1 application topically 2 (two) times daily. 08/14/19   Hall-Potvin, [place], PA-C    Family History Family History  Problem Relation Age of Onset  . Breast cancer Mother   . Hyperlipidemia Mother        entire family  . Hypertension Mother   . Aneurysm Father        AAA  . Stroke Father   . Breast cancer Paternal Grandmother   . Hypertension Sister   . Hypertension Brother     Social History Social History   Tobacco Use  . Smoking status: Never Smoker  . Smokeless tobacco: Never Used  Substance Use Topics  . Alcohol use: No  . Drug  use: No     Allergies   Bactrim [sulfamethoxazole-trimethoprim], Penicillins, Propoxyphene hcl, Statins, and Sulfa antibiotics   Review of Systems Review of Systems  Constitutional: Negative for fatigue and fever.  HENT: Negative for ear pain, sinus pain, sore throat and voice change.   Eyes: Negative for pain, redness and visual disturbance.  Respiratory: Negative for cough and shortness of breath.   Cardiovascular: Negative for chest pain and palpitations.  Gastrointestinal: Negative for abdominal pain, diarrhea and vomiting.  Musculoskeletal: Negative for arthralgias and myalgias.  Skin: Positive for rash. Negative for wound.  Neurological: Negative for syncope and headaches.     Physical Exam Triage Vital Signs ED Triage Vitals [08/14/19 1631]  Enc Vitals Group     BP  Pulse      Resp      Temp      Temp src      SpO2      Weight      Height      Head Circumference      Peak Flow      Pain Score 0     Pain Loc      Pain Edu?      Excl. in [place]?    No data found.  Updated Vital Signs There were no vitals taken for this visit.   Physical Exam Constitutional:      General: She is not in acute distress. HENT:     Head: Normocephalic and atraumatic.  Eyes:     General: No scleral icterus.    Pupils: Pupils are equal, round, and reactive to light.  Cardiovascular:     Rate and Rhythm: Normal rate and regular rhythm.     Heart sounds: No murmur. No gallop.   Pulmonary:     Effort: Pulmonary effort is normal. No respiratory distress.     Breath sounds: No wheezing.  Skin:    Capillary Refill: Capillary refill takes less than 2 seconds.     Comments: Scattered erythematous lesions with excoriations on distal upper and lower extremities bilaterally.  No open wounds, purulent discharge, warmth, tenderness.  Neurological:     General: No focal deficit present.     Mental Status: She is alert and oriented to person, place, and time.      UC Treatments /  Results  Labs (all labs ordered are listed, but only abnormal results are displayed) Labs Reviewed - No data to display  EKG   Radiology No results found.  Procedures Procedures (including critical care time)  Medications Ordered in UC Medications  methylPREDNISolone sodium succinate (SOLU-MEDROL) 125 mg/2 mL injection 125 mg (has no administration in time range)    Initial Impression / Assessment and Plan / UC Course  I have reviewed the triage vital signs and the nursing notes.  Pertinent labs & imaging results that were available during my care of the patient were reviewed by me and considered in my medical decision making (see chart for details).     1.  Contact dermatitis due to poison ivy Patient given Solu-Medrol IM in office which she tolerated well.  Will add triamcinolone for adjuvant therapy.  Return precautions discussed, patient verbalized understanding and is agreeable to plan. Final Clinical Impressions(s) / UC Diagnoses   Final diagnoses:  Contact dermatitis due to poison ivy     Discharge Instructions     Continue Benadryl, calamine lotion for additional pain/itching relief. May apply triamcinolone to the area as well. Return for worsening pain, rash, fever.   ED Prescriptions    Medication Sig Dispense Auth. Provider   triamcinolone ointment (KENALOG) 0.5 % Apply 1 application topically 2 (two) times daily. 30 g Hall-Potvin, [place], PA-C     Controlled Substance Prescriptions [place] Controlled Substance Registry consulted? Not Applicable   Quincy Sheehan, [place] 08/14/19 1640

## 2019-08-14 NOTE — Discharge Instructions (Addendum)
Continue Benadryl, calamine lotion for additional pain/itching relief. May apply triamcinolone to the area as well. Return for worsening pain, rash, fever.

## 2019-09-01 ENCOUNTER — Encounter: Payer: Self-pay | Admitting: Emergency Medicine

## 2019-09-01 ENCOUNTER — Ambulatory Visit
Admission: EM | Admit: 2019-09-01 | Discharge: 2019-09-01 | Disposition: A | Discharge status: Home / Self Care | Payer: Medicare Other | Attending: Physician Assistant | Admitting: Physician Assistant

## 2019-09-01 ENCOUNTER — Other Ambulatory Visit: Payer: Self-pay

## 2019-09-01 DIAGNOSIS — H00015 Hordeolum externum left lower eyelid: Secondary | ICD-10-CM

## 2019-09-01 MED ORDER — ERYTHROMYCIN 5 MG/GM OP OINT: TOPICAL_OINTMENT | OPHTHALMIC | 0 refills | Status: DC

## 2019-09-01 NOTE — ED Notes (Signed)
Patient able to ambulate independently  

## 2019-09-01 NOTE — Discharge Instructions (Signed)
Use erythromycin ointment as directed (directions will be on the bottle). Lid scrubs and warm compresses as directed. If symptoms not improving in 5 days, follow up with ophthalmologist for further evaluation needed. Monitor for any worsening of symptoms, changes in vision, sensitivity to light, eye swelling, painful eye movement, follow up with ophthalmology for further evaluation.

## 2019-09-01 NOTE — ED Triage Notes (Signed)
Pt presents to [facility] for assessment of sore area to lower lid of left eye.  Patient states she has been using eye drops from 2015 of Gentak, and Polumycin from 2016.    Patient c/o irritation, eye redness x 1 week.

## 2019-09-01 NOTE — ED Provider Notes (Signed)
[name]    CSN: [id number] Arrival date & time: 09/01/19  0955      History   Chief Complaint Chief Complaint  Patient presents with  . Eye Pain    HPI Alice [name] is a 68 y.o. female.   68 year old female comes in for 1 week history of left lower eyelid irritation, eye redness.  States area started as a possible stye, with swelling, for which she started using leftover antibiotic eyedrop from 4 to 5 years ago.  She noticed increased irritation.  Denies vision changes, eye discharge.  Denies photophobia.  Denies URI symptoms such as cough, congestion, sore throat.  Denies fever, chills, body aches.  States she has been trying to squeeze swollen area     Past Medical History:  Diagnosis Date  . Anorexia    HISTORY OF   . Anxiety   . Cancer ([place])    basal cell carcinoma per right side under arm   . DDD (degenerative disc disease), lumbar   . Depression   . GERD (gastroesophageal reflux disease)   . H/O hiatal hernia   . History of stomach ulcers   . Hyperlipidemia   . Kidney stone   . Sciatica    getting prednisone inj/epidural injections  . Tinnitus     Patient Active Problem List   Diagnosis Date Noted  . Menopausal syndrome 04/04/2018  . Greater trochanteric bursitis of left hip 11/07/2015  . Trochanteric bursitis of left hip 11/07/2015  . Hydrosalpinx 09/19/2015  . Spinal stenosis 06/09/2015  . Esophageal reflux 07/23/2014  . Dysphagia 07/23/2014  . Musculoskeletal pain 09/22/2012  . Special screening for malignant neoplasms, colon 09/22/2012  . Atypical hyperplasia of breast 08/11/2012  . IRRITABLE BOWEL SYNDROME 03/21/2009  . Constipation 03/02/2009  . ABDOMINAL BLOATING 03/02/2009  . ABDOMINAL PAIN-EPIGASTRIC 03/02/2009  . ABDOMINAL PAIN-MULTIPLE SITES 03/02/2009  . COLONIC POLYPS 03/01/2009  . DM 03/01/2009  . HYPERCHOLESTEROLEMIA 03/01/2009  . ANXIETY DEPRESSION 03/01/2009  . HYPERTENSION 03/01/2009  . ESOPHAGITIS  03/01/2009  . GASTRITIS, CHRONIC 03/01/2009  . NEPHROLITHIASIS 03/01/2009    Past Surgical History:  Procedure Laterality Date  . APPENDECTOMY  1959  . BACK SURGERY     with rods and screws placed in lumbar area   . BREAST BIOPSY Right    benign cyst  . COLONOSCOPY  2015   dilation of esophagus and polypectomy  . CYST EXCISION Right    foot  . CYSTOSCOPY KIDNEY W/ URETERAL GUIDE WIRE    . ELBOW SURGERY Left 2003  . EXCISION/RELEASE BURSA HIP Left 11/07/2015   Procedure: LEFT HIP BURSECTOMY WITH GLUTEAL TENDON REPAIR;  Surgeon: Gaynelle Arabian, MD;  Location: [facility];  Service: Orthopedics;  Laterality: Left;  . EYE SURGERY Right 2012   right-pterigium  . ROTATOR CUFF REPAIR Left 2003  . TONSILLECTOMY  1976  . UPPER GASTROINTESTINAL ENDOSCOPY  2015    OB History   No obstetric history on file.      Home Medications    Prior to Admission medications   Medication Sig Start Date End Date Taking? Authorizing Provider  Bepotastine Besilate (BEPREVE) 1.5 % SOLN Place 1 drop into both eyes daily as needed (burning or irritation).    [provider]  chlordiazePOXIDE (LIBRIUM) 10 MG capsule Take 10 mg by mouth daily as needed (stomach pain).     [provider]  clonazePAM (KLONOPIN) 1 MG tablet Take 1 mg by mouth at bedtime.    [provider]  clonazePAM (KLONOPIN) 2 MG tablet Take 2 mg by mouth at bedtime as needed.    [provider]  doxycycline (ORACEA) 40 MG capsule Take 40 mg by mouth See admin instructions. Take 1 capsule (40 mg) by mouth daily for 3 nights for rosacea breakouts    [provider]  erythromycin ophthalmic ointment Place a 1/2 inch ribbon of ointment into the left lower eyelid 4 times a day x 7 days 09/01/19   Ok Edwards, PA-C  Estradiol-Norethindrone Acet (ACTIVELLA) 0.5-0.1 MG tablet Activella 0.5 mg-0.1 mg tablet  Take 1 tablet every day by oral route for 90 days. 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  03/16/15   [provider]  gabapentin (NEURONTIN) 100 MG capsule Take 100 mg by mouth at bedtime. 10/05/15   [provider]  meclizine (ANTIVERT) 12.5 MG tablet Take 12.5 mg by mouth as needed.    [provider]  meloxicam (MOBIC) 15 MG tablet Take 15 mg by mouth daily. 10/17/15   [provider]  metroNIDAZOLE (METROCREAM) 0.75 % cream Apply 1 application topically See admin instructions. Applies twice a day during rosacea breakouts 12/02/14   [provider]  metroNIDAZOLE (METROCREAM) 0.75 % cream metronidazole 0.75 % topical cream    [provider]  neomycin-polymyxin-hydrocortisone (CORTISPORIN) OTIC solution Apply 1-2 drops to toe after soaking twice a day 02/05/19   Wallene Huh, DPM  nystatin (MYCOSTATIN) 100000 UNIT/ML suspension Take 5-10 mLs by mouth 4 (four) times daily as needed (thrush caused by doxycycline). Swish, gargle and swallow    [provider]  Omega-3 Fatty Acids (FISH OIL PO) Fish Oil    [provider]  pantoprazole (PROTONIX) 40 MG tablet Take 1 tablet (40 mg total) by mouth daily. 08/31/14   Inda Castle, MD  Polyethyl Glycol-Propyl Glycol (SYSTANE OP) Place 1 drop into both eyes daily as needed (dry eyes).    [provider]  Polyethyl Glycol-Propyl Glycol (SYSTANE) 0.4-0.3 % SOLN Apply 1 drop to eye 4 (four) times daily as needed. 06/09/19   Melynda Ripple, MD  sertraline (ZOLOFT) 50 MG tablet Take 50 mg by mouth at bedtime. Pt stated, "I only take a 1/2 tablet" 03/03/15   [provider]  tiZANidine (ZANAFLEX) 2 MG tablet Take 2 mg by mouth as needed. 07/25/19   [provider]  triamcinolone ointment (KENALOG) 0.5 % Apply 1 application topically 2 (two) times daily. 08/14/19   Hall-Potvin, [place], PA-C    Family History Family History  Problem Relation Age of Onset  .  Breast cancer Mother   . Hyperlipidemia Mother        entire family  . Hypertension Mother   . Aneurysm Father        AAA  . Stroke Father   . Breast cancer Paternal Grandmother   . Hypertension Sister   . Hypertension Brother     Social History Social History   Tobacco Use  . Smoking status: Never Smoker  . Smokeless tobacco: Never Used  Substance Use Topics  . Alcohol use: No  . Drug use: No     Allergies   Bactrim [sulfamethoxazole-trimethoprim], Penicillins, Propoxyphene hcl, Statins, and Sulfa antibiotics   Review of Systems Review of Systems  Reason unable to perform ROS: See HPI as above.     Physical Exam Triage Vital Signs ED Triage Vitals [09/01/19  1004]  Enc Vitals Group     BP 115/67     Pulse Rate 65     Resp 18     Temp 98.1 F (36.7 C)     Temp Source Oral     SpO2 98 %     Weight      Height      Head Circumference      Peak Flow      Pain Score 5     Pain Loc      Pain Edu?      Excl. in [place]?    No data found.  Updated Vital Signs BP 115/67 (BP Location: Left Arm)   Pulse 65   Temp 98.1 F (36.7 C) (Oral)   Resp 18   SpO2 98%   Visual Acuity Right Eye Distance:   Left Eye Distance:   Bilateral Distance:    Right Eye Near: R Near: 20/30 Left Eye Near:  L Near: 20/40 Bilateral Near:  20/25  Physical Exam Constitutional:      General: She is not in acute distress.    Appearance: Normal appearance. She is well-developed. She is not diaphoretic.  HENT:     Head: Normocephalic and atraumatic.  Eyes:     General: Lids are everted, no foreign bodies appreciated.     Extraocular Movements: Extraocular movements intact.     Conjunctiva/sclera: Conjunctivae normal.     Pupils: Pupils are equal, round, and reactive to light.   Pulmonary:     Effort: Pulmonary effort is normal. No respiratory distress.  Neurological:     Mental Status: She is alert and oriented to person, place, and time.      UC Treatments / Results   Labs (all labs ordered are listed, but only abnormal results are displayed) Labs Reviewed - No data to display  EKG   Radiology No results found.  Procedures Procedures (including critical care time)  Medications Ordered in UC Medications - No data to display  Initial Impression / Assessment and Plan / UC Course  I have reviewed the triage vital signs and the nursing notes.  Pertinent labs & imaging results that were available during my care of the patient were reviewed by me and considered in my medical decision making (see chart for details).    Will start erythromycin ointment for hordeolum.  Lid scrubs, warm compress discussed.  Patient to follow-up with ophthalmology if symptoms not improving.  Return precautions given.  Patient expresses understanding and agrees to plan.  Final Clinical Impressions(s) / UC Diagnoses   Final diagnoses:  Hordeolum externum of left lower eyelid   ED Prescriptions    Medication Sig Dispense Auth. Provider   erythromycin ophthalmic ointment Place a 1/2 inch ribbon of ointment into the left lower eyelid 4 times a day x 7 days 1 g Ok Edwards, PA-C     PDMP not reviewed this encounter.   Ok Edwards, PA-C 09/01/19 1046

## 2019-10-27 ENCOUNTER — Other Ambulatory Visit: Payer: Self-pay | Admitting: Obstetrics and Gynecology

## 2019-10-27 DIAGNOSIS — N6489 Other specified disorders of breast: Secondary | ICD-10-CM

## 2019-11-05 ENCOUNTER — Ambulatory Visit: Payer: Medicare Other

## 2019-11-05 ENCOUNTER — Other Ambulatory Visit: Payer: Self-pay

## 2019-11-05 ENCOUNTER — Ambulatory Visit
Admission: RE | Admit: 2019-11-05 | Discharge: 2019-11-05 | Disposition: A | Discharge status: Home / Self Care | Payer: Medicare Other | Source: Ambulatory Visit | Attending: Obstetrics and Gynecology | Admitting: Obstetrics and Gynecology

## 2019-11-05 DIAGNOSIS — N6489 Other specified disorders of breast: Secondary | ICD-10-CM

## 2019-11-24 ENCOUNTER — Ambulatory Visit
Admission: EM | Admit: 2019-11-24 | Discharge: 2019-11-24 | Disposition: A | Discharge status: Home / Self Care | Payer: Medicare Other | Attending: Physician Assistant | Admitting: Physician Assistant

## 2019-11-24 DIAGNOSIS — J3489 Other specified disorders of nose and nasal sinuses: Secondary | ICD-10-CM

## 2019-11-24 MED ORDER — SALINE SPRAY 0.65 % NA SOLN: 2.0000 | NASAL | 0 refills | Status: DC | PRN

## 2019-11-24 MED ORDER — MUPIROCIN 2 % EX OINT: 1.0000 "application " | TOPICAL_OINTMENT | Freq: Two times a day (BID) | CUTANEOUS | 0 refills | Status: DC

## 2019-11-24 NOTE — Discharge Instructions (Signed)
Bactroban as directed. Saline spray as needed. Humidifier to prevent room from being too dry. Monitor for spreading redness, warmth, increased pain. If noticing other cold symptoms such as cough, fever, shortness of breath, loss of taste/smell, will need COVID testing.

## 2019-11-24 NOTE — ED Provider Notes (Signed)
[name]    CSN: [id number] Arrival date & time: 11/24/19  1349      History   Chief Complaint Chief Complaint  Patient presents with  . Facial Pain    HPI Alice [name] is a 68 y.o. female.   68 year old female comes in for nasal congestion/dryness for 2 months. States nasal passage feels dry and crusting. States now with pain to the nasal opening with worsening for the past week. Has had nasal congestion/sinus pressure/rhinorrhea in the morning that resolves throughout the day. Denies fever, cough. Using flonase at bedtime.      Past Medical History:  Diagnosis Date  . Anorexia    HISTORY OF   . Anxiety   . Cancer ([place])    basal cell carcinoma per right side under arm   . DDD (degenerative disc disease), lumbar   . Depression   . GERD (gastroesophageal reflux disease)   . H/O hiatal hernia   . History of stomach ulcers   . Hyperlipidemia   . Kidney stone   . Sciatica    getting prednisone inj/epidural injections  . Tinnitus     Patient Active Problem List   Diagnosis Date Noted  . Menopausal syndrome 04/04/2018  . Greater trochanteric bursitis of left hip 11/07/2015  . Trochanteric bursitis of left hip 11/07/2015  . Hydrosalpinx 09/19/2015  . Spinal stenosis 06/09/2015  . Esophageal reflux 07/23/2014  . Dysphagia 07/23/2014  . Musculoskeletal pain 09/22/2012  . Special screening for malignant neoplasms, colon 09/22/2012  . Atypical hyperplasia of breast 08/11/2012  . IRRITABLE BOWEL SYNDROME 03/21/2009  . Constipation 03/02/2009  . ABDOMINAL BLOATING 03/02/2009  . ABDOMINAL PAIN-EPIGASTRIC 03/02/2009  . ABDOMINAL PAIN-MULTIPLE SITES 03/02/2009  . COLONIC POLYPS 03/01/2009  . DM 03/01/2009  . HYPERCHOLESTEROLEMIA 03/01/2009  . ANXIETY DEPRESSION 03/01/2009  . HYPERTENSION 03/01/2009  . ESOPHAGITIS 03/01/2009  . GASTRITIS, CHRONIC 03/01/2009  . NEPHROLITHIASIS 03/01/2009    Past Surgical History:  Procedure Laterality Date  .  APPENDECTOMY  1959  . BACK SURGERY     with rods and screws placed in lumbar area   . BREAST BIOPSY Right    benign cyst  . COLONOSCOPY  2015   dilation of esophagus and polypectomy  . CYST EXCISION Right    foot  . CYSTOSCOPY KIDNEY W/ URETERAL GUIDE WIRE    . ELBOW SURGERY Left 2003  . EXCISION/RELEASE BURSA HIP Left 11/07/2015   Procedure: LEFT HIP BURSECTOMY WITH GLUTEAL TENDON REPAIR;  Surgeon: Gaynelle Arabian, MD;  Location: [facility];  Service: Orthopedics;  Laterality: Left;  . EYE SURGERY Right 2012   right-pterigium  . ROTATOR CUFF REPAIR Left 2003  . TONSILLECTOMY  1976  . UPPER GASTROINTESTINAL ENDOSCOPY  2015    OB History   No obstetric history on file.      Home Medications    Prior to Admission medications   Medication Sig Start Date End Date Taking? Authorizing Provider  Bepotastine Besilate (BEPREVE) 1.5 % SOLN Place 1 drop into both eyes daily as needed (burning or irritation).    [provider]  chlordiazePOXIDE (LIBRIUM) 10 MG capsule Take 10 mg by mouth daily as needed (stomach pain).     [provider]  clonazePAM (KLONOPIN) 1 MG tablet Take 1 mg by mouth at bedtime.    [provider]  clonazePAM (KLONOPIN) 2 MG tablet Take 2 mg by mouth at bedtime as needed.    [provider]  doxycycline (ORACEA) 40  MG capsule Take 40 mg by mouth See admin instructions. Take 1 capsule (40 mg) by mouth daily for 3 nights for rosacea breakouts    [provider]  erythromycin ophthalmic ointment Place a 1/2 inch ribbon of ointment into the left lower eyelid 4 times a day x 7 days 09/01/19   Ok Edwards, PA-C  Estradiol-Norethindrone Acet (ACTIVELLA) 0.5-0.1 MG tablet Activella 0.5 mg-0.1 mg tablet  Take 1 tablet every day by oral route for 90 days. 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  03/16/15   [provider]  gabapentin  (NEURONTIN) 100 MG capsule Take 100 mg by mouth at bedtime. 10/05/15   [provider]  meclizine (ANTIVERT) 12.5 MG tablet Take 12.5 mg by mouth as needed.    [provider]  meloxicam (MOBIC) 15 MG tablet Take 15 mg by mouth daily. 10/17/15   [provider]  metroNIDAZOLE (METROCREAM) 0.75 % cream Apply 1 application topically See admin instructions. Applies twice a day during rosacea breakouts 12/02/14   [provider]  metroNIDAZOLE (METROCREAM) 0.75 % cream metronidazole 0.75 % topical cream    [provider]  mupirocin ointment (BACTROBAN) 2 % Apply 1 application topically 2 (two) times daily. Apply with q-tip to nasal opening twice day for 3-5 days. 11/24/19   Ok Edwards, PA-C  neomycin-polymyxin-hydrocortisone (CORTISPORIN) OTIC solution Apply 1-2 drops to toe after soaking twice a day 02/05/19   Wallene Huh, DPM  nystatin (MYCOSTATIN) 100000 UNIT/ML suspension Take 5-10 mLs by mouth 4 (four) times daily as needed (thrush caused by doxycycline). Swish, gargle and swallow    [provider]  Omega-3 Fatty Acids (FISH OIL PO) Fish Oil    [provider]  pantoprazole (PROTONIX) 40 MG tablet Take 1 tablet (40 mg total) by mouth daily. 08/31/14   Inda Castle, MD  Polyethyl Glycol-Propyl Glycol (SYSTANE OP) Place 1 drop into both eyes daily as needed (dry eyes).    [provider]  Polyethyl Glycol-Propyl Glycol (SYSTANE) 0.4-0.3 % SOLN Apply 1 drop to eye 4 (four) times daily as needed. 06/09/19   Melynda Ripple, MD  sertraline (ZOLOFT) 50 MG tablet Take 50 mg by mouth at bedtime. Pt stated, "I only take a 1/2 tablet" 03/03/15   [provider]  sodium chloride (OCEAN) 0.65 % SOLN nasal spray Place 2 sprays into both nostrils as needed for congestion. 11/24/19   Tasia Catchings, Herman Mell V, PA-C  tiZANidine (ZANAFLEX) 2 MG tablet Take 2 mg by mouth as needed. 07/25/19   [provider]  triamcinolone ointment (KENALOG)  0.5 % Apply 1 application topically 2 (two) times daily. 08/14/19   Hall-Potvin, [place], PA-C    Family History Family History  Problem Relation Age of Onset  . Breast cancer Mother   . Hyperlipidemia Mother        entire family  . Hypertension Mother   . Aneurysm Father        AAA  . Stroke Father   . Breast cancer Paternal Grandmother   . Hypertension Sister   . Hypertension Brother     Social History Social History   Tobacco Use  . Smoking status: Never Smoker  . Smokeless tobacco: Never Used  Substance Use Topics  . Alcohol use: No  . Drug use: No     Allergies   Bactrim [sulfamethoxazole-trimethoprim], Penicillins, Propoxyphene hcl, Statins, and Sulfa antibiotics  Review of Systems Review of Systems  Reason unable to perform ROS: See HPI as above.     Physical Exam Triage Vital Signs ED Triage Vitals [11/24/19 1410]  Enc Vitals Group     BP (!) 144/85     Pulse Rate 75     Resp 18     Temp 98.5 F (36.9 C)     Temp Source Oral     SpO2 96 %     Weight      Height      Head Circumference      Peak Flow      Pain Score 0     Pain Loc      Pain Edu?      Excl. in [place]?    No data found.  Updated Vital Signs BP 113/69 (BP Location: Left Arm)   Pulse 75   Temp 98.5 F (36.9 C) (Oral)   Resp 18   SpO2 96%    Physical Exam Constitutional:      General: She is not in acute distress.    Appearance: Normal appearance. She is not ill-appearing, toxic-appearing or diaphoretic.  HENT:     Head: Normocephalic and atraumatic.     Nose:     Right Sinus: No maxillary sinus tenderness or frontal sinus tenderness.     Left Sinus: No maxillary sinus tenderness or frontal sinus tenderness.     Comments: Pinpoint wound to the right medial nasal opening. No warmth, induration. Nasal passage dry, no epistaxis.     Mouth/Throat:     Mouth: Mucous membranes are moist.     Pharynx: Oropharynx is clear. Uvula midline.  Cardiovascular:     Rate and Rhythm:  Normal rate and regular rhythm.     Heart sounds: Normal heart sounds. No murmur. No friction rub. No gallop.   Pulmonary:     Effort: Pulmonary effort is normal. No accessory muscle usage, prolonged expiration, respiratory distress or retractions.     Comments: Lungs clear to auscultation without adventitious lung sounds. Musculoskeletal:     Cervical back: Normal range of motion and neck supple.  Neurological:     General: No focal deficit present.     Mental Status: She is alert and oriented to person, place, and time.      UC Treatments / Results  Labs (all labs ordered are listed, but only abnormal results are displayed) Labs Reviewed - No data to display  EKG   Radiology No results found.  Procedures Procedures (including critical care time)  Medications Ordered in UC Medications - No data to display  Initial Impression / Assessment and Plan / UC Course  I have reviewed the triage vital signs and the nursing notes.  Pertinent labs & imaging results that were available during my care of the patient were reviewed by me and considered in my medical decision making (see chart for details).    bactroban to nasal opening to prevent infection of small wound. Otherwise, discussed options such as saline spray and humidifier to help with dryness. Return precautions given.  Final Clinical Impressions(s) / UC Diagnoses   Final diagnoses:  Nasal dryness   ED Prescriptions    Medication Sig Dispense Auth. Provider   sodium chloride (OCEAN) 0.65 % SOLN nasal spray Place 2 sprays into both nostrils as needed for congestion. 30 mL Raimundo Corbit V, PA-C   mupirocin ointment (BACTROBAN) 2 % Apply 1 application topically 2 (two) times daily. Apply with q-tip to  nasal opening twice day for 3-5 days. 22 g Ok Edwards, PA-C     PDMP not reviewed this encounter.   Ok Edwards, PA-C 11/24/19 [phone]

## 2019-11-24 NOTE — ED Triage Notes (Signed)
Pt c/o nasal congestion/dryness since 10/20. C/o pain to nasal cavity.

## 2020-01-13 ENCOUNTER — Ambulatory Visit
Admission: EM | Admit: 2020-01-13 | Discharge: 2020-01-13 | Disposition: A | Discharge status: Home / Self Care | Payer: Medicare Other | Attending: Emergency Medicine | Admitting: Emergency Medicine

## 2020-01-13 ENCOUNTER — Other Ambulatory Visit: Payer: Self-pay

## 2020-01-13 ENCOUNTER — Encounter: Payer: Self-pay | Admitting: Emergency Medicine

## 2020-01-13 DIAGNOSIS — R319 Hematuria, unspecified: Secondary | ICD-10-CM

## 2020-01-13 DIAGNOSIS — K5900 Constipation, unspecified: Secondary | ICD-10-CM

## 2020-01-13 LAB — POCT URINALYSIS DIP (MANUAL ENTRY)
Bilirubin, UA: NEGATIVE
Glucose, UA: NEGATIVE mg/dL
Ketones, POC UA: NEGATIVE mg/dL
Leukocytes, UA: NEGATIVE
Nitrite, UA: NEGATIVE
Protein Ur, POC: NEGATIVE mg/dL
Spec Grav, UA: 1.025 (ref 1.010–1.025)
Urobilinogen, UA: 0.2 E.U./dL
pH, UA: 5.5 (ref 5.0–8.0)

## 2020-01-13 MED ORDER — KETOROLAC TROMETHAMINE 30 MG/ML IJ SOLN
30.0000 mg | Freq: Once | INTRAMUSCULAR | Status: AC
  Administered 2020-01-13: 16:00:00 30 mg via INTRAMUSCULAR

## 2020-01-13 NOTE — Discharge Instructions (Addendum)
You were given a toradol shot today in office for pain. Recommend taking 1 capful of miralax daily for constipation. Recommend you follow up with PCP tomorrow via phone to discuss symptoms further & see how you are doing as you may need to follow up with urology. Return for worsening pain, blood in urine, fever, inability to urinate.

## 2020-01-13 NOTE — ED Triage Notes (Signed)
Pt presents to [facility] for assessment of lower back pain, urinary urgency with low output and vaginal odor x 3-4 days.  States this is typical for a UTI for her.

## 2020-01-13 NOTE — ED Notes (Signed)
Patient able to ambulate independently  

## 2020-01-13 NOTE — ED Provider Notes (Signed)
[name]    CSN: [id number] Arrival date & time: 01/13/20  1514      History   Chief Complaint Chief Complaint  Patient presents with  . Dysuria    HPI Alice [name] is a 68 y.o. female history including basal cell carcinoma, kidney stones, sciatica presenting for 2 to 4-day course of urinary urgency and malodor, decreased urinary stream that is intermittent, and low back pain.  Patient endorsing history of UTIs: States this feels similar.  Patient states last kidney stone was greater than 10 years ago, had to undergo surgical intervention on left kidney stone.  Has not had recurrence follow-up with urology since.  Patient does note constipation over the last few days.  States that she has had significant stress in her personal life regarding her mother with dementia as well as feeling nervous about receiving her first dose of the Covid vaccine next week.  States that she tends to get constipated when she gets nervous.  Denies hematochezia, melena, painful bowel movement.    Past Medical History:  Diagnosis Date  . Anorexia    HISTORY OF   . Anxiety   . Cancer ([place])    basal cell carcinoma per right side under arm   . DDD (degenerative disc disease), lumbar   . Depression   . GERD (gastroesophageal reflux disease)   . H/O hiatal hernia   . History of stomach ulcers   . Hyperlipidemia   . Kidney stone   . Sciatica    getting prednisone inj/epidural injections  . Tinnitus     Patient Active Problem List   Diagnosis Date Noted  . Menopausal syndrome 04/04/2018  . Greater trochanteric bursitis of left hip 11/07/2015  . Trochanteric bursitis of left hip 11/07/2015  . Hydrosalpinx 09/19/2015  . Spinal stenosis 06/09/2015  . Esophageal reflux 07/23/2014  . Dysphagia 07/23/2014  . Musculoskeletal pain 09/22/2012  . Special screening for malignant neoplasms, colon 09/22/2012  . Atypical hyperplasia of breast 08/11/2012  . IRRITABLE BOWEL SYNDROME 03/21/2009   . Constipation 03/02/2009  . ABDOMINAL BLOATING 03/02/2009  . ABDOMINAL PAIN-EPIGASTRIC 03/02/2009  . ABDOMINAL PAIN-MULTIPLE SITES 03/02/2009  . COLONIC POLYPS 03/01/2009  . DM 03/01/2009  . HYPERCHOLESTEROLEMIA 03/01/2009  . ANXIETY DEPRESSION 03/01/2009  . HYPERTENSION 03/01/2009  . ESOPHAGITIS 03/01/2009  . GASTRITIS, CHRONIC 03/01/2009  . NEPHROLITHIASIS 03/01/2009    Past Surgical History:  Procedure Laterality Date  . APPENDECTOMY  1959  . BACK SURGERY     with rods and screws placed in lumbar area   . BREAST BIOPSY Right    benign cyst  . COLONOSCOPY  2015   dilation of esophagus and polypectomy  . CYST EXCISION Right    foot  . CYSTOSCOPY KIDNEY W/ URETERAL GUIDE WIRE    . ELBOW SURGERY Left 2003  . EXCISION/RELEASE BURSA HIP Left 11/07/2015   Procedure: LEFT HIP BURSECTOMY WITH GLUTEAL TENDON REPAIR;  Surgeon: Gaynelle Arabian, MD;  Location: [facility];  Service: Orthopedics;  Laterality: Left;  . EYE SURGERY Right 2012   right-pterigium  . ROTATOR CUFF REPAIR Left 2003  . TONSILLECTOMY  1976  . UPPER GASTROINTESTINAL ENDOSCOPY  2015    OB History   No obstetric history on file.      Home Medications    Prior to Admission medications   Medication Sig Start Date End Date Taking? Authorizing Provider  Bepotastine Besilate (BEPREVE) 1.5 % SOLN Place 1 drop into both eyes daily as needed (burning or irritation).  [provider]  chlordiazePOXIDE (LIBRIUM) 10 MG capsule Take 10 mg by mouth daily as needed (stomach pain).     [provider]  clonazePAM (KLONOPIN) 1 MG tablet Take 1 mg by mouth at bedtime.    [provider]  clonazePAM (KLONOPIN) 2 MG tablet Take 2 mg by mouth at bedtime as needed.    [provider]  doxycycline (ORACEA) 40 MG capsule Take 40 mg by mouth See admin instructions. Take 1 capsule (40 mg) by mouth daily for 3 nights for rosacea breakouts    [provider]  erythromycin ophthalmic ointment  Place a 1/2 inch ribbon of ointment into the left lower eyelid 4 times a day x 7 days 09/01/19   Ok Edwards, PA-C  Estradiol-Norethindrone Acet (ACTIVELLA) 0.5-0.1 MG tablet Activella 0.5 mg-0.1 mg tablet  Take 1 tablet every day by oral route for 90 days. 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  03/16/15   [provider]  gabapentin (NEURONTIN) 100 MG capsule Take 100 mg by mouth at bedtime. 10/05/15   [provider]  meclizine (ANTIVERT) 12.5 MG tablet Take 12.5 mg by mouth as needed.    [provider]  meloxicam (MOBIC) 15 MG tablet Take 15 mg by mouth daily. 10/17/15   [provider]  metroNIDAZOLE (METROCREAM) 0.75 % cream Apply 1 application topically See admin instructions. Applies twice a day during rosacea breakouts 12/02/14   [provider]  metroNIDAZOLE (METROCREAM) 0.75 % cream metronidazole 0.75 % topical cream    [provider]  mupirocin ointment (BACTROBAN) 2 % Apply 1 application topically 2 (two) times daily. Apply with q-tip to nasal opening twice day for 3-5 days. 11/24/19   Ok Edwards, PA-C  neomycin-polymyxin-hydrocortisone (CORTISPORIN) OTIC solution Apply 1-2 drops to toe after soaking twice a day 02/05/19   Wallene Huh, DPM  nystatin (MYCOSTATIN) 100000 UNIT/ML suspension Take 5-10 mLs by mouth 4 (four) times daily as needed (thrush caused by doxycycline). Swish, gargle and swallow    [provider]  Omega-3 Fatty Acids (FISH OIL PO) Fish Oil    [provider]  pantoprazole (PROTONIX) 40 MG tablet Take 1 tablet (40 mg total) by mouth daily. 08/31/14   Inda Castle, MD  Polyethyl Glycol-Propyl Glycol (SYSTANE OP) Place 1 drop into both eyes daily as needed (dry eyes).    [provider]  Polyethyl Glycol-Propyl Glycol (SYSTANE) 0.4-0.3 % SOLN Apply 1 drop to eye 4 (four) times daily as needed.  06/09/19   Melynda Ripple, MD  sertraline (ZOLOFT) 50 MG tablet Take 50 mg by mouth at bedtime. Pt stated, "I only take a 1/2 tablet" 03/03/15   [provider]  sodium chloride (OCEAN) 0.65 % SOLN nasal spray Place 2 sprays into both nostrils as needed for congestion. 11/24/19   Tasia Catchings, Amy V, PA-C  tiZANidine (ZANAFLEX) 2 MG tablet Take 2 mg by mouth as needed. 07/25/19   [provider]  triamcinolone ointment (KENALOG) 0.5 % Apply 1 application topically 2 (two) times daily. 08/14/19   Hall-Potvin, [place], PA-C    Family History Family History  Problem Relation Age of Onset  . Breast cancer Mother   . Hyperlipidemia Mother        entire family  . Hypertension Mother   . Aneurysm Father        AAA  . Stroke Father   .  Breast cancer Paternal Grandmother   . Hypertension Sister   . Hypertension Brother     Social History Social History   Tobacco Use  . Smoking status: Never Smoker  . Smokeless tobacco: Never Used  Substance Use Topics  . Alcohol use: No  . Drug use: No     Allergies   Bactrim [sulfamethoxazole-trimethoprim], Penicillins, Propoxyphene hcl, Statins, and Sulfa antibiotics   Review of Systems As per HPI  Physical Exam Triage Vital Signs ED Triage Vitals  Enc Vitals Group     BP 01/13/20 1523 (!) 145/97     Pulse Rate 01/13/20 1523 60     Resp 01/13/20 1523 18     Temp 01/13/20 1523 97.8 F (36.6 C)     Temp Source 01/13/20 1523 Temporal     SpO2 01/13/20 1523 98 %     Weight --      Height --      Head Circumference --      Peak Flow --      Pain Score 01/13/20 1522 7     Pain Loc --      Pain Edu? --      Excl. in [place]? --    No data found.  Updated Vital Signs BP (!) 145/97 (BP Location: Left Arm)   Pulse 60   Temp 97.8 F (36.6 C) (Temporal)   Resp 18   SpO2 98%   Visual Acuity Right Eye Distance:   Left Eye Distance:   Bilateral Distance:    Right Eye Near:   Left Eye Near:    Bilateral Near:     Physical  Exam Constitutional:      General: She is not in acute distress. HENT:     Head: Normocephalic and atraumatic.  Eyes:     General: No scleral icterus.    Pupils: Pupils are equal, round, and reactive to light.  Cardiovascular:     Rate and Rhythm: Normal rate.  Pulmonary:     Effort: Pulmonary effort is normal.  Abdominal:     General: Bowel sounds are normal.     Palpations: Abdomen is soft.     Tenderness: There is no abdominal tenderness. There is no right CVA tenderness, left CVA tenderness or guarding.  Skin:    Coloration: Skin is not jaundiced or pale.  Neurological:     Mental Status: She is alert and oriented to person, place, and time.      UC Treatments / Results  Labs (all labs ordered are listed, but only abnormal results are displayed) Labs Reviewed  POCT URINALYSIS DIP (MANUAL ENTRY) - Abnormal; Notable for the following components:      Result Value   Blood, UA trace-intact (*)    All other components within normal limits    EKG   Radiology No results found.  Procedures Procedures (including critical care time)  Medications Ordered in UC Medications  ketorolac (TORADOL) 30 MG/ML injection 30 mg (30 mg Intramuscular Given 01/13/20 1557)    Initial Impression / Assessment and Plan / UC Course  I have reviewed the triage vital signs and the nursing notes.  Pertinent labs & imaging results that were available during my care of the patient were reviewed by me and considered in my medical decision making (see chart for details).     Patient afebrile, nontoxic in office today urine dipstick showing trace intact blood, otherwise unremarkable.  Low concern for UTI at this time: Culture deferred.  Discussed kidney  stones could be contributory: Given Toradol in office to trial analgesia, the constipation more likely explains symptoms at this time.  Patient to start MiraLAX once daily, follow-up closely with PCP to review efficacy of Toradol as well as  progress regarding constipation.  Return precautions discussed, patient verbalized understanding and is agreeable to plan. Final Clinical Impressions(s) / UC Diagnoses   Final diagnoses:  Hematuria, unspecified type  Constipation, unspecified constipation type     Discharge Instructions     You were given a toradol shot today in office for pain. Recommend taking 1 capful of miralax daily for constipation. Recommend you follow up with PCP tomorrow via phone to discuss symptoms further & see how you are doing as you may need to follow up with urology. Return for worsening pain, blood in urine, fever, inability to urinate.    ED Prescriptions    None     PDMP not reviewed this encounter.   Hall-Potvin, [place], [place] 01/13/20 1621

## 2020-05-31 ENCOUNTER — Other Ambulatory Visit: Payer: Self-pay

## 2020-05-31 ENCOUNTER — Ambulatory Visit
Admission: EM | Admit: 2020-05-31 | Discharge: 2020-05-31 | Disposition: A | Discharge status: Home / Self Care | Payer: Medicare Other | Attending: Emergency Medicine | Admitting: Emergency Medicine

## 2020-05-31 DIAGNOSIS — H5789 Other specified disorders of eye and adnexa: Secondary | ICD-10-CM

## 2020-05-31 MED ORDER — POLYMYXIN B-TRIMETHOPRIM 10000-0.1 UNIT/ML-% OP SOLN: 1.0000 [drp] | OPHTHALMIC | 0 refills | Status: DC

## 2020-05-31 NOTE — Discharge Instructions (Signed)
Use eyedrops as directed on eye(s) as prescribed.  May use artificial tear gel/drops at needed. °Important to use artificial tear gel/drops last and wait 10-15 minutes between drops as it can prevent your prescription drops from working properly.  °Important to follow up with Ophthalmology (eye doctor). °Return sooner for worsening of symptoms, change in vision, sensitivity to light, eye swelling, painful eye movement, or fever.  °

## 2020-05-31 NOTE — ED Triage Notes (Signed)
Pt c/o lower eyelid pain and swelling x 5 days, states started after working under her house

## 2020-05-31 NOTE — ED Provider Notes (Signed)
[name]    CSN: [id number] Arrival date & time: 05/31/20  1421      History   Chief Complaint Chief Complaint  Patient presents with  . Eye Pain    HPI Alice [name] is a 69 y.o. female presenting for right lower eye pain and swelling for the last 5 days.  Patient states that she was working in her house and felt something going to her eye.  Patient denies change in vision, fever, discharge.  Applied hot compress infrequently with minimal relief.   Past Medical History:  Diagnosis Date  . Anorexia    HISTORY OF   . Anxiety   . Cancer ([place])    basal cell carcinoma per right side under arm   . DDD (degenerative disc disease), lumbar   . Depression   . GERD (gastroesophageal reflux disease)   . H/O hiatal hernia   . History of stomach ulcers   . Hyperlipidemia   . Kidney stone   . Sciatica    getting prednisone inj/epidural injections  . Tinnitus     Patient Active Problem List   Diagnosis Date Noted  . Menopausal syndrome 04/04/2018  . Greater trochanteric bursitis of left hip 11/07/2015  . Trochanteric bursitis of left hip 11/07/2015  . Hydrosalpinx 09/19/2015  . Spinal stenosis 06/09/2015  . Esophageal reflux 07/23/2014  . Dysphagia 07/23/2014  . Musculoskeletal pain 09/22/2012  . Special screening for malignant neoplasms, colon 09/22/2012  . Atypical hyperplasia of breast 08/11/2012  . IRRITABLE BOWEL SYNDROME 03/21/2009  . Constipation 03/02/2009  . ABDOMINAL BLOATING 03/02/2009  . ABDOMINAL PAIN-EPIGASTRIC 03/02/2009  . ABDOMINAL PAIN-MULTIPLE SITES 03/02/2009  . COLONIC POLYPS 03/01/2009  . DM 03/01/2009  . HYPERCHOLESTEROLEMIA 03/01/2009  . ANXIETY DEPRESSION 03/01/2009  . HYPERTENSION 03/01/2009  . ESOPHAGITIS 03/01/2009  . GASTRITIS, CHRONIC 03/01/2009  . NEPHROLITHIASIS 03/01/2009    Past Surgical History:  Procedure Laterality Date  . APPENDECTOMY  1959  . BACK SURGERY     with rods and screws placed in lumbar area     . BREAST BIOPSY Right    benign cyst  . COLONOSCOPY  2015   dilation of esophagus and polypectomy  . CYST EXCISION Right    foot  . CYSTOSCOPY KIDNEY W/ URETERAL GUIDE WIRE    . ELBOW SURGERY Left 2003  . EXCISION/RELEASE BURSA HIP Left 11/07/2015   Procedure: LEFT HIP BURSECTOMY WITH GLUTEAL TENDON REPAIR;  Surgeon: Gaynelle Arabian, MD;  Location: [facility];  Service: Orthopedics;  Laterality: Left;  . EYE SURGERY Right 2012   right-pterigium  . ROTATOR CUFF REPAIR Left 2003  . TONSILLECTOMY  1976  . UPPER GASTROINTESTINAL ENDOSCOPY  2015    OB History   No obstetric history on file.      Home Medications    Prior to Admission medications   Medication Sig Start Date End Date Taking? Authorizing Provider  chlordiazePOXIDE (LIBRIUM) 10 MG capsule Take 10 mg by mouth daily as needed (stomach pain).     [provider]  clonazePAM (KLONOPIN) 1 MG tablet Take 1 mg by mouth at bedtime.    [provider]  clonazePAM (KLONOPIN) 2 MG tablet Take 2 mg by mouth at bedtime as needed.    [provider]  doxycycline (ORACEA) 40 MG capsule Take 40 mg by mouth See admin instructions. Take 1 capsule (40 mg) by mouth daily for 3 nights for rosacea breakouts    [provider]  Estradiol-Norethindrone Acet (ACTIVELLA) 0.5-0.1 MG  tablet Activella 0.5 mg-0.1 mg tablet  Take 1 tablet every day by oral route for 90 days. 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  03/16/15   [provider]  gabapentin (NEURONTIN) 100 MG capsule Take 100 mg by mouth at bedtime. 10/05/15   [provider]  meclizine (ANTIVERT) 12.5 MG tablet Take 12.5 mg by mouth as needed.    [provider]  meloxicam (MOBIC) 15 MG tablet Take 15 mg by mouth daily. 10/17/15   [provider]  metroNIDAZOLE (METROCREAM) 0.75 % cream Apply 1 application topically See admin  instructions. Applies twice a day during rosacea breakouts 12/02/14   [provider]  metroNIDAZOLE (METROCREAM) 0.75 % cream metronidazole 0.75 % topical cream    [provider]  mupirocin ointment (BACTROBAN) 2 % Apply 1 application topically 2 (two) times daily. Apply with q-tip to nasal opening twice day for 3-5 days. 11/24/19   Ok Edwards, PA-C  neomycin-polymyxin-hydrocortisone (CORTISPORIN) OTIC solution Apply 1-2 drops to toe after soaking twice a day 02/05/19   Wallene Huh, DPM  nystatin (MYCOSTATIN) 100000 UNIT/ML suspension Take 5-10 mLs by mouth 4 (four) times daily as needed (thrush caused by doxycycline). Swish, gargle and swallow    [provider]  Omega-3 Fatty Acids (FISH OIL PO) Fish Oil    [provider]  pantoprazole (PROTONIX) 40 MG tablet Take 1 tablet (40 mg total) by mouth daily. 08/31/14   Inda Castle, MD  Polyethyl Glycol-Propyl Glycol (SYSTANE OP) Place 1 drop into both eyes daily as needed (dry eyes).    [provider]  Polyethyl Glycol-Propyl Glycol (SYSTANE) 0.4-0.3 % SOLN Apply 1 drop to eye 4 (four) times daily as needed. 06/09/19   Melynda Ripple, MD  sertraline (ZOLOFT) 50 MG tablet Take 50 mg by mouth at bedtime. Pt stated, "I only take a 1/2 tablet" 03/03/15   [provider]  sodium chloride (OCEAN) 0.65 % SOLN nasal spray Place 2 sprays into both nostrils as needed for congestion. 11/24/19   Tasia Catchings, Amy V, PA-C  tiZANidine (ZANAFLEX) 2 MG tablet Take 2 mg by mouth as needed. 07/25/19   [provider]  triamcinolone ointment (KENALOG) 0.5 % Apply 1 application topically 2 (two) times daily. 08/14/19   Hall-Potvin, [place], PA-C  trimethoprim-polymyxin b (POLYTRIM) ophthalmic solution Place 1 drop into the left eye every 4 (four) hours. 05/31/20   Hall-Potvin, [place], PA-C    Family History Family History  Problem Relation Age of Onset  . Breast cancer Mother   . Hyperlipidemia Mother         entire family  . Hypertension Mother   . Aneurysm Father        AAA  . Stroke Father   . Breast cancer Paternal Grandmother   . Hypertension Sister   . Hypertension Brother     Social History Social History   Tobacco Use  . Smoking status: Never Smoker  . Smokeless tobacco: Never Used  Vaping Use  . Vaping Use: Never used  Substance Use Topics  . Alcohol use: No  . Drug use: No     Allergies   Bactrim [sulfamethoxazole-trimethoprim], Penicillins, Propoxyphene hcl, Statins, and Sulfa antibiotics   Review of Systems As per HPI   Physical Exam Triage Vital Signs ED Triage Vitals [05/31/20 1419]  Enc Vitals Group     BP 136/83  Pulse Rate 61     Resp 16     Temp 97.8 F (36.6 C)     Temp src      SpO2 98 %     Weight      Height      Head Circumference      Peak Flow      Pain Score 6     Pain Loc      Pain Edu?      Excl. in [place]?    No data found.  Updated Vital Signs BP 136/83   Pulse 61   Temp 97.8 F (36.6 C)   Resp 16   SpO2 98%   Visual Acuity Right Eye Distance:   Left Eye Distance:   Bilateral Distance:    Right Eye Near:   Left Eye Near:    Bilateral Near:     Physical Exam Constitutional:      General: She is not in acute distress. HENT:     Head: Normocephalic and atraumatic.  Eyes:     General: No scleral icterus.       Right eye: No discharge.        Left eye: No discharge.     Extraocular Movements: Extraocular movements intact.     Conjunctiva/sclera: Conjunctivae normal.     Pupils: Pupils are equal, round, and reactive to light.     Comments: Right lower eyelid with minimal erythema.  No swelling, punctate, discharge.  No obvious stye.  Fluorescein eye exam unremarkable.  Cardiovascular:     Rate and Rhythm: Normal rate.  Pulmonary:     Effort: Pulmonary effort is normal.  Skin:    Coloration: Skin is not jaundiced or pale.  Neurological:     Mental Status: She is alert and oriented to person, place, and time.        UC Treatments / Results  Labs (all labs ordered are listed, but only abnormal results are displayed) Labs Reviewed - No data to display  EKG   Radiology No results found.  Procedures Procedures (including critical care time)  Medications Ordered in UC Medications - No data to display  Initial Impression / Assessment and Plan / UC Course  I have reviewed the triage vital signs and the nursing notes.  Pertinent labs & imaging results that were available during my care of the patient were reviewed by me and considered in my medical decision making (see chart for details).     Fluorescein dye exam unremarkable.  Patient does not wear contact lenses.  Does have mild erythema: We will start Polytrim, increase compresses, and follow-up with ophthalmology.  Likely forming stye versus right eye irritation.  Return precautions discussed, patient verbalized understanding and is agreeable to plan. Final Clinical Impressions(s) / UC Diagnoses   Final diagnoses:  Irritation of right eye     Discharge Instructions     Use eyedrops as directed on eye(s) as prescribed.  May use artificial tear gel/drops at needed. Important to use artificial tear gel/drops last and wait 10-15 minutes between drops as it can prevent your prescription drops from working properly.  Important to follow up with Ophthalmology (eye doctor). Return sooner for worsening of symptoms, change in vision, sensitivity to light, eye swelling, painful eye movement, or fever.     ED Prescriptions    Medication Sig Dispense Auth. Provider   trimethoprim-polymyxin b (POLYTRIM) ophthalmic solution Place 1 drop into the left eye every 4 (four) hours. 10 mL Hall-Potvin,  [place], PA-C     PDMP not reviewed this encounter.   Hall-Potvin, [place], [place] 05/31/20 1521

## 2020-09-14 ENCOUNTER — Other Ambulatory Visit: Payer: Self-pay | Admitting: Family Medicine

## 2020-09-14 DIAGNOSIS — Z1231 Encounter for screening mammogram for malignant neoplasm of breast: Secondary | ICD-10-CM

## 2020-10-19 ENCOUNTER — Ambulatory Visit
Admission: RE | Admit: 2020-10-19 | Discharge: 2020-10-19 | Disposition: A | Discharge status: Home / Self Care | Payer: Medicare Other | Source: Ambulatory Visit | Attending: Family Medicine | Admitting: Family Medicine

## 2020-10-19 ENCOUNTER — Other Ambulatory Visit: Payer: Self-pay

## 2020-10-19 DIAGNOSIS — Z1231 Encounter for screening mammogram for malignant neoplasm of breast: Secondary | ICD-10-CM

## 2020-11-02 ENCOUNTER — Ambulatory Visit: Payer: Medicare Other

## 2020-11-04 ENCOUNTER — Ambulatory Visit (INDEPENDENT_AMBULATORY_CARE_PROVIDER_SITE_OTHER): Payer: Medicare Other

## 2020-11-04 ENCOUNTER — Ambulatory Visit
Admission: EM | Admit: 2020-11-04 | Discharge: 2020-11-04 | Disposition: A | Discharge status: Home / Self Care | Payer: Medicare Other | Attending: Family Medicine | Admitting: Family Medicine

## 2020-11-04 DIAGNOSIS — M79671 Pain in right foot: Secondary | ICD-10-CM

## 2020-11-04 DIAGNOSIS — M79674 Pain in right toe(s): Secondary | ICD-10-CM

## 2020-11-04 MED ORDER — PREDNISONE 20 MG PO TABS: 20.0000 mg | ORAL_TABLET | Freq: Every day | ORAL | 0 refills | Status: DC

## 2020-11-04 MED ORDER — NYSTATIN 100000 UNIT/ML MT SUSP: 500000.0000 [IU] | Freq: Four times a day (QID) | OROMUCOSAL | 0 refills | Status: DC

## 2020-11-04 NOTE — ED Triage Notes (Signed)
Pt said she has been walking a lot and doing yard work and her right foot on big toe and the bone area there is swelling and soreness. No known injury.

## 2020-11-04 NOTE — Discharge Instructions (Addendum)
Follow-up with orthopedics for podiatry for further evaluation of toe pain for possible tendon injection if your pain does not resolve with the prednisone course.

## 2020-11-04 NOTE — ED Provider Notes (Signed)
[name]    CSN: [id number] Arrival date & time: 11/04/20  1134      History   Chief Complaint Chief Complaint  Patient presents with  . Foot Pain    right     HPI Alice [name] is a 69 y.o. female.   HPI  Patient complains of right great toe pain which has been present intermittently for some time.  Patient reports that there is a bone protruding from the side area of her toe which is swollen and very sore.  This is most troublesome with activities of weightbearing.  Patient denies history of injury. Past Medical History:  Diagnosis Date  . Anorexia    HISTORY OF   . Anxiety   . Cancer ([place])    basal cell carcinoma per right side under arm   . DDD (degenerative disc disease), lumbar   . Depression   . GERD (gastroesophageal reflux disease)   . H/O hiatal hernia   . History of stomach ulcers   . Hyperlipidemia   . Kidney stone   . Sciatica    getting prednisone inj/epidural injections  . Tinnitus     Patient Active Problem List   Diagnosis Date Noted  . Menopausal syndrome 04/04/2018  . Greater trochanteric bursitis of left hip 11/07/2015  . Trochanteric bursitis of left hip 11/07/2015  . Hydrosalpinx 09/19/2015  . Spinal stenosis 06/09/2015  . Esophageal reflux 07/23/2014  . Dysphagia 07/23/2014  . Musculoskeletal pain 09/22/2012  . Special screening for malignant neoplasms, colon 09/22/2012  . Atypical hyperplasia of breast 08/11/2012  . IRRITABLE BOWEL SYNDROME 03/21/2009  . Constipation 03/02/2009  . ABDOMINAL BLOATING 03/02/2009  . ABDOMINAL PAIN-EPIGASTRIC 03/02/2009  . ABDOMINAL PAIN-MULTIPLE SITES 03/02/2009  . COLONIC POLYPS 03/01/2009  . DM 03/01/2009  . HYPERCHOLESTEROLEMIA 03/01/2009  . ANXIETY DEPRESSION 03/01/2009  . HYPERTENSION 03/01/2009  . ESOPHAGITIS 03/01/2009  . GASTRITIS, CHRONIC 03/01/2009  . NEPHROLITHIASIS 03/01/2009    Past Surgical History:  Procedure Laterality Date  . APPENDECTOMY  1959  . BACK  SURGERY     with rods and screws placed in lumbar area   . BREAST BIOPSY Right    benign cyst  . COLONOSCOPY  2015   dilation of esophagus and polypectomy  . CYST EXCISION Right    foot  . CYSTOSCOPY KIDNEY W/ URETERAL GUIDE WIRE    . ELBOW SURGERY Left 2003  . EXCISION/RELEASE BURSA HIP Left 11/07/2015   Procedure: LEFT HIP BURSECTOMY WITH GLUTEAL TENDON REPAIR;  Surgeon: Gaynelle Arabian, MD;  Location: [facility];  Service: Orthopedics;  Laterality: Left;  . EYE SURGERY Right 2012   right-pterigium  . ROTATOR CUFF REPAIR Left 2003  . TONSILLECTOMY  1976  . UPPER GASTROINTESTINAL ENDOSCOPY  2015    OB History   No obstetric history on file.      Home Medications    Prior to Admission medications   Medication Sig Start Date End Date Taking? Authorizing Provider  chlordiazePOXIDE (LIBRIUM) 10 MG capsule Take 10 mg by mouth daily as needed (stomach pain).     [provider]  clonazePAM (KLONOPIN) 1 MG tablet Take 1 mg by mouth at bedtime.    [provider]  clonazePAM (KLONOPIN) 2 MG tablet Take 2 mg by mouth at bedtime as needed.    [provider]  doxycycline (ORACEA) 40 MG capsule Take 40 mg by mouth See admin instructions. Take 1 capsule (40 mg) by mouth daily for 3 nights for rosacea breakouts  [provider]  Estradiol-Norethindrone Acet (ACTIVELLA) 0.5-0.1 MG tablet Activella 0.5 mg-0.1 mg tablet  Take 1 tablet every day by oral route for 90 days. 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  03/16/15   [provider]  gabapentin (NEURONTIN) 100 MG capsule Take 100 mg by mouth at bedtime. 10/05/15   [provider]  meclizine (ANTIVERT) 12.5 MG tablet Take 12.5 mg by mouth as needed.    [provider]  meloxicam (MOBIC) 15 MG tablet Take 15 mg by mouth daily. 10/17/15   [provider]  metroNIDAZOLE  (METROCREAM) 0.75 % cream Apply 1 application topically See admin instructions. Applies twice a day during rosacea breakouts 12/02/14   [provider]  metroNIDAZOLE (METROCREAM) 0.75 % cream metronidazole 0.75 % topical cream    [provider]  mupirocin ointment (BACTROBAN) 2 % Apply 1 application topically 2 (two) times daily. Apply with q-tip to nasal opening twice day for 3-5 days. 11/24/19   Ok Edwards, PA-C  neomycin-polymyxin-hydrocortisone (CORTISPORIN) OTIC solution Apply 1-2 drops to toe after soaking twice a day 02/05/19   Wallene Huh, DPM  nystatin (MYCOSTATIN) 100000 UNIT/ML suspension Take 5-10 mLs by mouth 4 (four) times daily as needed (thrush caused by doxycycline). Swish, gargle and swallow    [provider]  Omega-3 Fatty Acids (FISH OIL PO) Fish Oil    [provider]  pantoprazole (PROTONIX) 40 MG tablet Take 1 tablet (40 mg total) by mouth daily. 08/31/14   Inda Castle, MD  Polyethyl Glycol-Propyl Glycol (SYSTANE OP) Place 1 drop into both eyes daily as needed (dry eyes).    [provider]  Polyethyl Glycol-Propyl Glycol (SYSTANE) 0.4-0.3 % SOLN Apply 1 drop to eye 4 (four) times daily as needed. 06/09/19   Melynda Ripple, MD  sertraline (ZOLOFT) 50 MG tablet Take 50 mg by mouth at bedtime. Pt stated, "I only take a 1/2 tablet" 03/03/15   [provider]  sodium chloride (OCEAN) 0.65 % SOLN nasal spray Place 2 sprays into both nostrils as needed for congestion. 11/24/19   Tasia Catchings, Amy V, PA-C  tiZANidine (ZANAFLEX) 2 MG tablet Take 2 mg by mouth as needed. 07/25/19   [provider]  triamcinolone ointment (KENALOG) 0.5 % Apply 1 application topically 2 (two) times daily. 08/14/19   Hall-Potvin, [place], PA-C  trimethoprim-polymyxin b (POLYTRIM) ophthalmic solution Place 1 drop into the left eye every 4 (four) hours. 05/31/20   Hall-Potvin, [place], PA-C    Family History Family History  Problem Relation Age of  Onset  . Breast cancer Mother   . Hyperlipidemia Mother        entire family  . Hypertension Mother   . Aneurysm Father        AAA  . Stroke Father   . Breast cancer Paternal Grandmother   . Hypertension Sister   . Hypertension Brother     Social History Social History   Tobacco Use  . Smoking status: Never Smoker  . Smokeless tobacco: Never Used  Vaping Use  . Vaping Use: Never used  Substance Use Topics  . Alcohol use: No  . Drug use: No     Allergies   Bactrim [sulfamethoxazole-trimethoprim], Penicillins, Propoxyphene hcl, Statins, and Sulfa antibiotics   Review of Systems Review of Systems Pertinent negatives listed in HPI   Physical Exam Triage Vital Signs ED Triage Vitals  Enc  Vitals Group     BP 11/04/20 1237 113/62     Pulse Rate 11/04/20 1237 62     Resp 11/04/20 1237 16     Temp 11/04/20 1237 98.6 F (37 C)     Temp Source 11/04/20 1237 Oral     SpO2 11/04/20 1237 96 %     Weight --      Height --      Head Circumference --      Peak Flow --      Pain Score 11/04/20 1235 6     Pain Loc --      Pain Edu? --      Excl. in [place]? --    No data found.  Updated Vital Signs BP 113/62 (BP Location: Right Arm)   Pulse 62   Temp 98.6 F (37 C) (Oral)   Resp 16   SpO2 96%   Visual Acuity Right Eye Distance:   Left Eye Distance:   Bilateral Distance:    Right Eye Near:   Left Eye Near:    Bilateral Near:     Physical Exam General appearance: alert, well developed, well nourished, cooperative and in no distress Head: Normocephalic, without obvious abnormality, atraumatic Respiratory: Respirations even and unlabored, normal respiratory rate Heart: rate and rhythm normal. No gallop or murmurs noted on exam  Extremities: Right great toe PIP joint palpable tenderness No gross deformities Skin: Skin color, texture, turgor normal. No rashes seen  Psych: Appropriate mood and affect.  UC Treatments / Results  Labs (all labs ordered are listed,  but only abnormal results are displayed) Labs Reviewed - No data to display  EKG   Radiology No results found.  Procedures Procedures (including critical care time)  Medications Ordered in UC Medications - No data to display  Initial Impression / Assessment and Plan / UC Course  I have reviewed the triage vital signs and the nursing notes.  Pertinent labs & imaging results that were available during my care of the patient were reviewed by me and considered in my medical decision making (see chart for details).     Great toe, right, imaging negative. Will trial a short course of prednisone. Nystatin oral rinse patient requested as prednisone has caused thrush in the past. Advised to follow-up with podiatrist if symptoms worsen or do not improve. Final Clinical Impressions(s) / UC Diagnoses   Final diagnoses:  Great toe pain, right     Discharge Instructions     Follow-up with orthopedics for podiatry for further evaluation of toe pain for possible tendon injection if your pain does not resolve with the prednisone course.    ED Prescriptions    Medication Sig Dispense Auth. Provider   predniSONE (DELTASONE) 20 MG tablet Take 1 tablet (20 mg total) by mouth daily with breakfast. 10 tablet Scot Jun, FNP   nystatin (MYCOSTATIN) 100000 UNIT/ML suspension Take 5 mLs (500,000 Units total) by mouth 4 (four) times daily. 60 mL Scot Jun, FNP     PDMP not reviewed this encounter.   Scot Jun, FNP 11/09/20 2230

## 2020-11-18 DIAGNOSIS — G609 Hereditary and idiopathic neuropathy, unspecified: Secondary | ICD-10-CM | POA: Insufficient documentation

## 2020-12-16 DIAGNOSIS — Z20822 Contact with and (suspected) exposure to covid-19: Secondary | ICD-10-CM | POA: Diagnosis not present

## 2021-01-09 ENCOUNTER — Ambulatory Visit: Payer: Medicare Other | Admitting: Podiatry

## 2021-01-09 ENCOUNTER — Ambulatory Visit (INDEPENDENT_AMBULATORY_CARE_PROVIDER_SITE_OTHER): Payer: Medicare Other

## 2021-01-09 ENCOUNTER — Other Ambulatory Visit: Payer: Self-pay

## 2021-01-09 DIAGNOSIS — L600 Ingrowing nail: Secondary | ICD-10-CM | POA: Diagnosis not present

## 2021-01-09 DIAGNOSIS — M21619 Bunion of unspecified foot: Secondary | ICD-10-CM

## 2021-01-09 DIAGNOSIS — M722 Plantar fascial fibromatosis: Secondary | ICD-10-CM | POA: Diagnosis not present

## 2021-01-09 NOTE — Patient Instructions (Signed)
Plantar Fasciitis (Heel Spur Syndrome) with Rehab The plantar fascia is a fibrous, ligament-like, soft-tissue structure that spans the bottom of the foot. Plantar fasciitis is a condition that causes pain in the foot due to inflammation of the tissue. SYMPTOMS   Pain and tenderness on the underneath side of the foot.  Pain that worsens with standing or walking. CAUSES  Plantar fasciitis is caused by irritation and injury to the plantar fascia on the underneath side of the foot. Common mechanisms of injury include:  Direct trauma to bottom of the foot.  Damage to a small nerve that runs under the foot where the main fascia attaches to the heel bone.  Stress placed on the plantar fascia due to bone spurs. RISK INCREASES WITH:   Activities that place stress on the plantar fascia (running, jumping, pivoting, or cutting).  Poor strength and flexibility.  Improperly fitted shoes.  Tight calf muscles.  Flat feet.  Failure to warm-up properly before activity.  Obesity. PREVENTION  Warm up and stretch properly before activity.  Allow for adequate recovery between workouts.  Maintain physical fitness:  Strength, flexibility, and endurance.  Cardiovascular fitness.  Maintain a health body weight.  Avoid stress on the plantar fascia.  Wear properly fitted shoes, including arch supports for individuals who have flat feet.  PROGNOSIS  If treated properly, then the symptoms of plantar fasciitis usually resolve without surgery. However, occasionally surgery is necessary.  RELATED COMPLICATIONS   Recurrent symptoms that may result in a chronic condition.  Problems of the lower back that are caused by compensating for the injury, such as limping.  Pain or weakness of the foot during push-off following surgery.  Chronic inflammation, scarring, and partial or complete fascia tear, occurring more often from repeated injections.  TREATMENT  Treatment initially involves the  use of ice and medication to help reduce pain and inflammation. The use of strengthening and stretching exercises may help reduce pain with activity, especially stretches of the Achilles tendon. These exercises may be performed at home or with a therapist. Your caregiver may recommend that you use heel cups of arch supports to help reduce stress on the plantar fascia. Occasionally, corticosteroid injections are given to reduce inflammation. If symptoms persist for greater than 6 months despite non-surgical (conservative), then surgery may be recommended.   MEDICATION   If pain medication is necessary, then nonsteroidal anti-inflammatory medications, such as aspirin and ibuprofen, or other minor pain relievers, such as acetaminophen, are often recommended.  Do not take pain medication within 7 days before surgery.  Prescription pain relievers may be given if deemed necessary by your caregiver. Use only as directed and only as much as you need.  Corticosteroid injections may be given by your caregiver. These injections should be reserved for the most serious cases, because they may only be given a certain number of times.  HEAT AND COLD  Cold treatment (icing) relieves pain and reduces inflammation. Cold treatment should be applied for 10 to 15 minutes every 2 to 3 hours for inflammation and pain and immediately after any activity that aggravates your symptoms. Use ice packs or massage the area with a piece of ice (ice massage).  Heat treatment may be used prior to performing the stretching and strengthening activities prescribed by your caregiver, physical therapist, or athletic trainer. Use a heat pack or soak the injury in warm water.  SEEK IMMEDIATE MEDICAL CARE IF:  Treatment seems to offer no benefit, or the condition worsens.  Any medications   produce adverse side effects.  EXERCISES- RANGE OF MOTION (ROM) AND STRETCHING EXERCISES - Plantar Fasciitis (Heel Spur Syndrome) These exercises  may help you when beginning to rehabilitate your injury. Your symptoms may resolve with or without further involvement from your physician, physical therapist or athletic trainer. While completing these exercises, remember:   Restoring tissue flexibility helps normal motion to return to the joints. This allows healthier, less painful movement and activity.  An effective stretch should be held for at least 30 seconds.  A stretch should never be painful. You should only feel a gentle lengthening or release in the stretched tissue.  RANGE OF MOTION - Toe Extension, Flexion  Sit with your right / left leg crossed over your opposite knee.  Grasp your toes and gently pull them back toward the top of your foot. You should feel a stretch on the bottom of your toes and/or foot.  Hold this stretch for 10 seconds.  Now, gently pull your toes toward the bottom of your foot. You should feel a stretch on the top of your toes and or foot.  Hold this stretch for 10 seconds. Repeat  times. Complete this stretch 3 times per day.   RANGE OF MOTION - Ankle Dorsiflexion, Active Assisted  Remove shoes and sit on a chair that is preferably not on a carpeted surface.  Place right / left foot under knee. Extend your opposite leg for support.  Keeping your heel down, slide your right / left foot back toward the chair until you feel a stretch at your ankle or calf. If you do not feel a stretch, slide your bottom forward to the edge of the chair, while still keeping your heel down.  Hold this stretch for 10 seconds. Repeat 3 times. Complete this stretch 2 times per day.   STRETCH  Gastroc, Standing  Place hands on wall.  Extend right / left leg, keeping the front knee somewhat bent.  Slightly point your toes inward on your back foot.  Keeping your right / left heel on the floor and your knee straight, shift your weight toward the wall, not allowing your back to arch.  You should feel a gentle stretch  in the right / left calf. Hold this position for 10 seconds. Repeat 3 times. Complete this stretch 2 times per day.  STRETCH  Soleus, Standing  Place hands on wall.  Extend right / left leg, keeping the other knee somewhat bent.  Slightly point your toes inward on your back foot.  Keep your right / left heel on the floor, bend your back knee, and slightly shift your weight over the back leg so that you feel a gentle stretch deep in your back calf.  Hold this position for 10 seconds. Repeat 3 times. Complete this stretch 2 times per day.  STRETCH  Gastrocsoleus, Standing  Note: This exercise can place a lot of stress on your foot and ankle. Please complete this exercise only if specifically instructed by your caregiver.   Place the ball of your right / left foot on a step, keeping your other foot firmly on the same step.  Hold on to the wall or a rail for balance.  Slowly lift your other foot, allowing your body weight to press your heel down over the edge of the step.  You should feel a stretch in your right / left calf.  Hold this position for 10 seconds.  Repeat this exercise with a slight bend in your right /   left knee. Repeat 3 times. Complete this stretch 2 times per day.   STRENGTHENING EXERCISES - Plantar Fasciitis (Heel Spur Syndrome)  These exercises may help you when beginning to rehabilitate your injury. They may resolve your symptoms with or without further involvement from your physician, physical therapist or athletic trainer. While completing these exercises, remember:   Muscles can gain both the endurance and the strength needed for everyday activities through controlled exercises.  Complete these exercises as instructed by your physician, physical therapist or athletic trainer. Progress the resistance and repetitions only as guided.  STRENGTH - Towel Curls  Sit in a chair positioned on a non-carpeted surface.  Place your foot on a towel, keeping your heel  on the floor.  Pull the towel toward your heel by only curling your toes. Keep your heel on the floor. Repeat 3 times. Complete this exercise 2 times per day.  STRENGTH - Ankle Inversion  Secure one end of a rubber exercise band/tubing to a fixed object (table, pole). Loop the other end around your foot just before your toes.  Place your fists between your knees. This will focus your strengthening at your ankle.  Slowly, pull your big toe up and in, making sure the band/tubing is positioned to resist the entire motion.  Hold this position for 10 seconds.  Have your muscles resist the band/tubing as it slowly pulls your foot back to the starting position. Repeat 3 times. Complete this exercises 2 times per day.  Document Released: 11/19/2005 Document Revised: 02/11/2012 Document Reviewed: 03/03/2009 [employer] Patient Information 2014 [employer] Bunion A bunion (hallux valgus) is a bump that forms slowly on the inner side of the big toe joint. It occurs when the big toe turns toward the second toe. Bunions may be small at first, but they often get larger over time. They can make walking painful. What are the causes? This condition may be caused by:  Wearing narrow or pointed shoes that force the big toe to press against the other toes.  Abnormal foot development that causes the foot to roll inward.  Changes in the foot that are caused by certain diseases, such as rheumatoid arthritis or polio.  A foot injury. What increases the risk? The following factors may make you more likely to develop this condition:  Wearing shoes that squeeze the toes together.  Having certain diseases, such as: ? Rheumatoid arthritis. ? Polio. ? Cerebral palsy.  Having family members who have bunions.  Being born with abnormally shaped feet (a foot deformity), such as flat feet or low arches.  Doing activities that put a lot of pressure on the feet, such as ballet dancing. What are the signs  or symptoms? The main symptom of this condition is a bump on your big toe that you can notice. Other symptoms may include:  Pain.  Redness and inflammation around your big toe.  Thick or hardened skin on your big toe or between your toes.  Stiffness or loss of motion in your big toe.  Trouble with walking.   How is this diagnosed? This condition may be diagnosed based on your symptoms, medical history, and activities. You may also have tests and imaging, such as:  X-rays. These allow your health care provider to check the position of the bones in your foot and look for damage to your joint. They also help your health care provider determine the severity of your bunion and the best way to treat it.  Joint aspiration. In this   test, a sample of fluid is removed from the toe joint. This test may be done if you are in a lot of pain. It helps rule out diseases that cause painful swelling of the joints, such as arthritis or gout. How is this treated? Treatment depends on the severity of your symptoms. The goal of treatment is to relieve symptoms and prevent your bunion from getting worse. Your health care provider may recommend:  Wearing shoes that have a wide toe box, or using bunion pads to cushion the affected area.  Taping your toes together to keep them in a normal position.  Placing a device inside your shoe (orthotic device) to help reduce pressure on your toe joint.  Taking medicine to ease pain and inflammation.  Putting ice or heat on the affected area.  Doing stretching exercises.  Surgery, for severe cases. Follow these instructions at home: Managing pain, stiffness, and swelling  If directed, put ice on the painful area. To do this: ? Put ice in a plastic bag. ? Place a towel between your skin and the bag. ? Leave the ice on for 20 minutes, 2-3 times a day. ? Remove the ice if your skin turns bright red. This is very important. If you cannot feel pain, heat, or cold,  you have a greater risk of damage to the area.  If directed, apply heat to the affected area before you exercise. Use the heat source that your health care provider recommends, such as a moist heat pack or a heating pad. ? Place a towel between your skin and the heat source. ? Leave the heat on for 20-30 minutes. ? Remove the heat if your skin turns bright red. This is especially important if you are unable to feel pain, heat, or cold. You have a greater risk of getting burned.      General instructions  Do exercises as told by your health care provider.  Support your toe joint with proper footwear, shoe padding, or taping as told by your health care provider.  Take over-the-counter and prescription medicines only as told by your health care provider.  Do not use any products that contain nicotine or tobacco, such as cigarettes, e-cigarettes, and chewing tobacco. If you need help quitting, ask your health care provider.  Keep all follow-up visits. This is important. Contact a health care provider if:  Your symptoms get worse.  Your symptoms do not improve in 2 weeks. Get help right away if:  You have severe pain and trouble with walking. Summary  A bunion is a bump on the inner side of the big toe joint that forms when the big toe turns toward the second toe.  Bunions can make walking painful.  Treatment depends on the severity of your symptoms.  Support your toe joint with proper footwear, shoe padding, or taping as told by your health care provider. This information is not intended to replace advice given to you by your health care provider. Make sure you discuss any questions you have with your health care provider. Document Revised: 03/25/2020 Document Reviewed: 03/25/2020 [employer] Patient Education  2021 [employer]  

## 2021-01-12 NOTE — Progress Notes (Signed)
Subjective: 70 year old female presents the office today for concerns of pain to both of her big toe joints pain along the first MPJ with the right side worse than left.  Is been getting worse the last 2 to 3 months denies any injury.  She states that her toes have started to turn out as well on her big toes.  Describes sharp throbbing sensation.  She also gets soreness the tips of her first toes at times.  She tried icing as well as Epson salts.  She was told an injection would not be helpful.  She previously had ingrown toenails. Denies any systemic complaints such as fevers, chills, nausea, vomiting. No acute changes since last appointment, and no other complaints at this time.   Objective: AAO x3, NAD DP/PT pulses palpable bilaterally, CRT less than 3 seconds Mild to moderate bunions are present bilaterally and there is mild erythema on the medial first MPJ where it irritates inside of her shoes there is no skin breakdown or warmth.  Erythema is blanchable.  There is no pain or restriction with first MPJ range of motion but there is tenderness in the first MPJ.  Hallux abductus is noted pain pressure to the second toes.  Minimal incurvation to lateral aspect of the hallux toenails without any edema, erythema, drainage or pus or signs of infection.  Mild discomfort the arch of the foot on the plantar fascia.  Flatfoot is present.  No area of pinpoint tenderness.  MMT 5/5.  No pain with calf compression, swelling, warmth, erythema  Assessment: 70 year old female with plantar fasciitis, bunion deformity, ingrown toenail  Plan: -All treatment options discussed with the patient including all alternatives, risks, complications.  -Ingrown toenails asymptomatic and no signs of infection.  Will monitor this area.  Dispensed toe separator between the hallux and second toe.  Discussed bunion pads.  Discussed shoe modifications and orthotics.  Discussed stretching, icing daily for plantar fasciitis.  We held  off on a steroid injection if symptoms continue with his next appointment. -Patient encouraged to call the office with any questions, concerns, change in symptoms.   Trula Slade DPM

## 2021-01-31 ENCOUNTER — Ambulatory Visit
Admission: EM | Admit: 2021-01-31 | Discharge: 2021-01-31 | Disposition: A | Discharge status: Home / Self Care | Payer: Medicare Other | Attending: Urgent Care | Admitting: Urgent Care

## 2021-01-31 ENCOUNTER — Other Ambulatory Visit: Payer: Self-pay

## 2021-01-31 DIAGNOSIS — R3915 Urgency of urination: Secondary | ICD-10-CM | POA: Diagnosis not present

## 2021-01-31 DIAGNOSIS — M545 Low back pain, unspecified: Secondary | ICD-10-CM | POA: Insufficient documentation

## 2021-01-31 DIAGNOSIS — R350 Frequency of micturition: Secondary | ICD-10-CM | POA: Insufficient documentation

## 2021-01-31 DIAGNOSIS — R300 Dysuria: Secondary | ICD-10-CM | POA: Insufficient documentation

## 2021-01-31 LAB — POCT URINALYSIS DIP (MANUAL ENTRY)
Bilirubin, UA: NEGATIVE
Glucose, UA: NEGATIVE mg/dL
Ketones, POC UA: NEGATIVE mg/dL
Nitrite, UA: NEGATIVE
Protein Ur, POC: NEGATIVE mg/dL
Spec Grav, UA: >=1.03 — AB (ref 1.010–1.025)
Urobilinogen, UA: 0.2 E.U./dL
pH, UA: 5.5 (ref 5.0–8.0)

## 2021-01-31 MED ORDER — PHENAZOPYRIDINE HCL 200 MG PO TABS: 200.0000 mg | ORAL_TABLET | Freq: Three times a day (TID) | ORAL | 0 refills | Status: DC | PRN

## 2021-01-31 NOTE — Discharge Instructions (Addendum)
Make sure you hydrate very well with plain water and a quantity of 64 ounces of water a day.  Please limit drinks that are considered urinary irritants such as soda, sweet tea, coffee, energy drinks, alcohol.  These can worsen your urinary and genital symptoms but also be the source of them.  I will let you know about your urine culture results through MyChart to see if we need to prescribe you antibiotics based off of those results.

## 2021-01-31 NOTE — ED Triage Notes (Signed)
Pt c/o lower back pain, pain on urination with urgency and frequency x1 wk.

## 2021-01-31 NOTE — ED Provider Notes (Signed)
Kiana   MRN: [id number] DOB: 13-Nov-1951  Subjective:   Alice [name] is a 70 y.o. female presenting for 1 week history of persistent mild dysuria, urinary frequency and urgency.  She started to have some occasional low back pains when she urinates.  Denies fever, nausea, vomiting, hematuria.  Has previously had a UTI before and wants to make sure she does not have this.  Admits that she does not drink water.  She primarily drinks [facility].  She does have a history of spinal stenosis, has previously had back surgery, spinal fusion.  No recent traumas, falls, weakness, numbness or tingling.  No current facility-administered medications for this encounter.  Current Outpatient Medications:  .  chlordiazePOXIDE (LIBRIUM) 10 MG capsule, Take 10 mg by mouth daily as needed (stomach pain). , Disp: , Rfl:  .  clonazePAM (KLONOPIN) 1 MG tablet, Take 1 mg by mouth at bedtime., Disp: , Rfl:  .  clonazePAM (KLONOPIN) 2 MG tablet, Take 2 mg by mouth at bedtime as needed., Disp: , Rfl:  .  doxycycline (ORACEA) 40 MG capsule, Take 40 mg by mouth See admin instructions. Take 1 capsule (40 mg) by mouth daily for 3 nights for rosacea breakouts, Disp: , Rfl:  .  Estradiol-Norethindrone Acet (ACTIVELLA) 0.5-0.1 MG tablet, Activella 0.5 mg-0.1 mg tablet  Take 1 tablet every day by oral route for 90 days., Disp: , Rfl:  .  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 , Disp: , Rfl: 2 .  gabapentin (NEURONTIN) 100 MG capsule, Take 100 mg by mouth at bedtime., Disp: , Rfl: 5 .  meclizine (ANTIVERT) 12.5 MG tablet, Take 12.5 mg by mouth as needed., Disp: , Rfl:  .  meloxicam (MOBIC) 15 MG tablet, Take 15 mg by mouth daily., Disp: , Rfl: 2 .  metroNIDAZOLE (METROCREAM) 0.75 % cream, Apply 1 application topically See admin instructions. Applies twice a day during rosacea breakouts, Disp: , Rfl: 6 .  metroNIDAZOLE (METROCREAM) 0.75 %  cream, metronidazole 0.75 % topical cream, Disp: , Rfl:  .  mupirocin ointment (BACTROBAN) 2 %, Apply 1 application topically 2 (two) times daily. Apply with q-tip to nasal opening twice day for 3-5 days., Disp: 22 g, Rfl: 0 .  neomycin-polymyxin-hydrocortisone (CORTISPORIN) OTIC solution, Apply 1-2 drops to toe after soaking twice a day, Disp: 10 mL, Rfl: 0 .  nystatin (MYCOSTATIN) 100000 UNIT/ML suspension, Take 5 mLs (500,000 Units total) by mouth 4 (four) times daily., Disp: 60 mL, Rfl: 0 .  Omega-3 Fatty Acids (FISH OIL PO), Fish Oil, Disp: , Rfl:  .  pantoprazole (PROTONIX) 40 MG tablet, Take 1 tablet (40 mg total) by mouth daily., Disp: 90 tablet, Rfl: 3 .  Polyethyl Glycol-Propyl Glycol (SYSTANE OP), Place 1 drop into both eyes daily as needed (dry eyes)., Disp: , Rfl:  .  Polyethyl Glycol-Propyl Glycol (SYSTANE) 0.4-0.3 % SOLN, Apply 1 drop to eye 4 (four) times daily as needed., Disp: 5 mL, Rfl: 0 .  sertraline (ZOLOFT) 50 MG tablet, Take 50 mg by mouth at bedtime. Pt stated, "I only take a 1/2 tablet", Disp: , Rfl: 0 .  sodium chloride (OCEAN) 0.65 % SOLN nasal spray, Place 2 sprays into both nostrils as needed for congestion., Disp: 30 mL, Rfl: 0 .  tiZANidine (ZANAFLEX) 2 MG tablet, Take 2 mg by mouth as needed., Disp: , Rfl:  .  triamcinolone ointment (KENALOG) 0.5 %, Apply 1 application topically 2 (  two) times daily., Disp: 30 g, Rfl: 0 .  trimethoprim-polymyxin b (POLYTRIM) ophthalmic solution, Place 1 drop into the left eye every 4 (four) hours., Disp: 10 mL, Rfl: 0   Allergies  Allergen Reactions  . Bactrim [Sulfamethoxazole-Trimethoprim] Other (See Comments)    Burns stomach  . Penicillins Other (See Comments)    Burns stomach Has patient had a PCN reaction causing immediate rash, facial/tongue/throat swelling, SOB or lightheadedness with hypotension: No Has patient had a PCN reaction causing severe rash involving mucus membranes or skin necrosis: No Has patient had a PCN  reaction that required hospitalization No Has patient had a PCN reaction occurring within the last 10 years: No If all of the above answers are "NO", then may proceed with Cephalosporin use.   Marland Kitchen Propoxyphene Hcl Nausea And Vomiting  . Statins Other (See Comments)    Burns stomach  . Sulfa Antibiotics Other (See Comments)    Burns stomach    Past Medical History:  Diagnosis Date  . Anorexia    HISTORY OF   . Anxiety   . Cancer ([place])    basal cell carcinoma per right side under arm   . DDD (degenerative disc disease), lumbar   . Depression   . GERD (gastroesophageal reflux disease)   . H/O hiatal hernia   . History of stomach ulcers   . Hyperlipidemia   . Kidney stone   . Sciatica    getting prednisone inj/epidural injections  . Tinnitus      Past Surgical History:  Procedure Laterality Date  . APPENDECTOMY  1959  . BACK SURGERY     with rods and screws placed in lumbar area   . BREAST BIOPSY Right    benign cyst  . COLONOSCOPY  2015   dilation of esophagus and polypectomy  . CYST EXCISION Right    foot  . CYSTOSCOPY KIDNEY W/ URETERAL GUIDE WIRE    . ELBOW SURGERY Left 2003  . EXCISION/RELEASE BURSA HIP Left 11/07/2015   Procedure: LEFT HIP BURSECTOMY WITH GLUTEAL TENDON REPAIR;  Surgeon: Gaynelle Arabian, MD;  Location: [facility];  Service: Orthopedics;  Laterality: Left;  . EYE SURGERY Right 2012   right-pterigium  . ROTATOR CUFF REPAIR Left 2003  . TONSILLECTOMY  1976  . UPPER GASTROINTESTINAL ENDOSCOPY  2015    Family History  Problem Relation Age of Onset  . Breast cancer Mother   . Hyperlipidemia Mother        entire family  . Hypertension Mother   . Aneurysm Father        AAA  . Stroke Father   . Breast cancer Paternal Grandmother   . Hypertension Sister   . Hypertension Brother     Social History   Tobacco Use  . Smoking status: Never Smoker  . Smokeless tobacco: Never Used  Vaping Use  . Vaping Use: Never used  Substance Use Topics  . Alcohol  use: No  . Drug use: No    ROS   Objective:   Vitals: BP 138/67 (BP Location: Left Arm)   Pulse 76   Temp 97.7 F (36.5 C) (Oral)   Resp 18   SpO2 95%   Physical Exam Constitutional:      General: She is not in acute distress.    Appearance: Normal appearance. She is well-developed. She is not ill-appearing, toxic-appearing or diaphoretic.  HENT:     Head: Normocephalic and atraumatic.     Nose: Nose normal.     Mouth/Throat:  Mouth: Mucous membranes are moist.     Pharynx: Oropharynx is clear.  Eyes:     General: No scleral icterus.    Extraocular Movements: Extraocular movements intact.     Pupils: Pupils are equal, round, and reactive to light.  Cardiovascular:     Rate and Rhythm: Normal rate.  Pulmonary:     Effort: Pulmonary effort is normal.  Abdominal:     General: There is no distension.     Palpations: There is no mass.     Tenderness: There is no abdominal tenderness. There is no right CVA tenderness, left CVA tenderness, guarding or rebound.  Musculoskeletal:     Lumbar back: No swelling, edema, deformity, signs of trauma, lacerations, spasms, tenderness or bony tenderness. Normal range of motion. Negative right straight leg raise test and negative left straight leg raise test. No scoliosis.  Skin:    General: Skin is warm and dry.  Neurological:     General: No focal deficit present.     Mental Status: She is alert and oriented to person, place, and time.     Motor: No weakness.     Coordination: Coordination normal.     Gait: Gait normal.     Deep Tendon Reflexes: Reflexes normal.  Psychiatric:        Mood and Affect: Mood normal.        Behavior: Behavior normal.        Thought Content: Thought content normal.        Judgment: Judgment normal.     Results for orders placed or performed during the hospital encounter of 01/31/21 (from the past 24 hour(s))  POCT urinalysis dipstick     Status: Abnormal   Collection Time: 01/31/21  5:36 PM   Result Value Ref Range   Color, UA yellow yellow   Clarity, UA clear clear   Glucose, UA negative negative mg/dL   Bilirubin, UA negative negative   Ketones, POC UA negative negative mg/dL   Spec Grav, UA >=1.030 (A) 1.010 - 1.025   Blood, UA trace-lysed (A) negative   pH, UA 5.5 5.0 - 8.0   Protein Ur, POC negative negative mg/dL   Urobilinogen, UA 0.2 0.2 or 1.0 E.U./dL   Nitrite, UA Negative Negative   Leukocytes, UA Trace (A) Negative    Assessment and Plan :   PDMP not reviewed this encounter.  1. Urinary frequency   2. Urinary urgency   3. Acute bilateral low back pain without sciatica   4. Dysuria     Counseled patient that I suspect she is having urinary irritation from not hydrating with water and drinking [facility] on a daily basis.  Emphasized need to hydrate with just plain water, offered Pyridium for her symptoms.  Urine culture pending. Counseled patient on potential for adverse effects with medications prescribed/recommended today, ER and return-to-clinic precautions discussed, patient verbalized understanding.    Jaynee Eagles, [place] 01/31/21 [id number]

## 2021-02-02 LAB — URINE CULTURE

## 2021-02-06 ENCOUNTER — Ambulatory Visit: Payer: Medicare Other | Admitting: Podiatry

## 2021-03-03 ENCOUNTER — Other Ambulatory Visit: Payer: Self-pay

## 2021-03-03 ENCOUNTER — Ambulatory Visit (INDEPENDENT_AMBULATORY_CARE_PROVIDER_SITE_OTHER): Payer: Medicare Other

## 2021-03-03 ENCOUNTER — Ambulatory Visit
Admission: EM | Admit: 2021-03-03 | Discharge: 2021-03-03 | Disposition: A | Discharge status: Home / Self Care | Payer: Medicare Other | Attending: Urgent Care | Admitting: Urgent Care

## 2021-03-03 DIAGNOSIS — R079 Chest pain, unspecified: Secondary | ICD-10-CM

## 2021-03-03 DIAGNOSIS — R0781 Pleurodynia: Secondary | ICD-10-CM | POA: Diagnosis not present

## 2021-03-03 DIAGNOSIS — S20211A Contusion of right front wall of thorax, initial encounter: Secondary | ICD-10-CM | POA: Diagnosis not present

## 2021-03-03 DIAGNOSIS — W19XXXA Unspecified fall, initial encounter: Secondary | ICD-10-CM | POA: Diagnosis not present

## 2021-03-03 MED ORDER — TIZANIDINE HCL 4 MG PO TABS: 4.0000 mg | ORAL_TABLET | Freq: Every day | ORAL | 0 refills | Status: DC

## 2021-03-03 MED ORDER — MELOXICAM 7.5 MG PO TABS: 7.5000 mg | ORAL_TABLET | Freq: Every day | ORAL | 0 refills | Status: DC

## 2021-03-03 NOTE — ED Provider Notes (Signed)
Alice [name]   MRN: [id number] DOB: Aug 18, 1951  Subjective:   Alice [name] is a 70 y.o. female presenting for 1 week history of persistent right-sided lateral rib pain.  Patient states that symptoms started after she suffered a fall, fell in between her couch and the coffee table making impact against the coffee table.  Has since had persistent pain, difficulty taking a deep breath as a result.  Has been using Tylenol with minimal relief.  Sometimes holding her right upper arm close to her chest provides relief.  Denies any bruising, bony deformity.  Denies head injury, loss consciousness, confusion, weakness.  No current facility-administered medications for this encounter.  Current Outpatient Medications:  .  chlordiazePOXIDE (LIBRIUM) 10 MG capsule, Take 10 mg by mouth daily as needed (stomach pain). , Disp: , Rfl:  .  clonazePAM (KLONOPIN) 1 MG tablet, Take 1 mg by mouth at bedtime., Disp: , Rfl:  .  clonazePAM (KLONOPIN) 2 MG tablet, Take 2 mg by mouth at bedtime as needed., Disp: , Rfl:  .  doxycycline (ORACEA) 40 MG capsule, Take 40 mg by mouth See admin instructions. Take 1 capsule (40 mg) by mouth daily for 3 nights for rosacea breakouts, Disp: , Rfl:  .  Estradiol-Norethindrone Acet (ACTIVELLA) 0.5-0.1 MG tablet, Activella 0.5 mg-0.1 mg tablet  Take 1 tablet every day by oral route for 90 days., Disp: , Rfl:  .  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 , Disp: , Rfl: 2 .  gabapentin (NEURONTIN) 100 MG capsule, Take 100 mg by mouth at bedtime., Disp: , Rfl: 5 .  meclizine (ANTIVERT) 12.5 MG tablet, Take 12.5 mg by mouth as needed., Disp: , Rfl:  .  meloxicam (MOBIC) 15 MG tablet, Take 15 mg by mouth daily., Disp: , Rfl: 2 .  metroNIDAZOLE (METROCREAM) 0.75 % cream, Apply 1 application topically See admin instructions. Applies twice a day during rosacea breakouts, Disp: , Rfl: 6 .  metroNIDAZOLE  (METROCREAM) 0.75 % cream, metronidazole 0.75 % topical cream, Disp: , Rfl:  .  mupirocin ointment (BACTROBAN) 2 %, Apply 1 application topically 2 (two) times daily. Apply with q-tip to nasal opening twice day for 3-5 days., Disp: 22 g, Rfl: 0 .  neomycin-polymyxin-hydrocortisone (CORTISPORIN) OTIC solution, Apply 1-2 drops to toe after soaking twice a day, Disp: 10 mL, Rfl: 0 .  nystatin (MYCOSTATIN) 100000 UNIT/ML suspension, Take 5 mLs (500,000 Units total) by mouth 4 (four) times daily., Disp: 60 mL, Rfl: 0 .  Omega-3 Fatty Acids (FISH OIL PO), Fish Oil, Disp: , Rfl:  .  pantoprazole (PROTONIX) 40 MG tablet, Take 1 tablet (40 mg total) by mouth daily., Disp: 90 tablet, Rfl: 3 .  phenazopyridine (PYRIDIUM) 200 MG tablet, Take 1 tablet (200 mg total) by mouth 3 (three) times daily as needed for pain., Disp: 10 tablet, Rfl: 0 .  Polyethyl Glycol-Propyl Glycol (SYSTANE OP), Place 1 drop into both eyes daily as needed (dry eyes)., Disp: , Rfl:  .  Polyethyl Glycol-Propyl Glycol (SYSTANE) 0.4-0.3 % SOLN, Apply 1 drop to eye 4 (four) times daily as needed., Disp: 5 mL, Rfl: 0 .  sertraline (ZOLOFT) 50 MG tablet, Take 50 mg by mouth at bedtime. Pt stated, "I only take a 1/2 tablet", Disp: , Rfl: 0 .  sodium chloride (OCEAN) 0.65 % SOLN nasal spray, Place 2 sprays into both nostrils as needed for congestion., Disp: 30 mL, Rfl: 0 .  tiZANidine (ZANAFLEX)  2 MG tablet, Take 2 mg by mouth as needed., Disp: , Rfl:  .  triamcinolone ointment (KENALOG) 0.5 %, Apply 1 application topically 2 (two) times daily., Disp: 30 g, Rfl: 0 .  trimethoprim-polymyxin b (POLYTRIM) ophthalmic solution, Place 1 drop into the left eye every 4 (four) hours., Disp: 10 mL, Rfl: 0   Allergies  Allergen Reactions  . Bactrim [Sulfamethoxazole-Trimethoprim] Other (See Comments)    Burns stomach  . Penicillins Other (See Comments)    Burns stomach Has patient had a PCN reaction causing immediate rash, facial/tongue/throat  swelling, SOB or lightheadedness with hypotension: No Has patient had a PCN reaction causing severe rash involving mucus membranes or skin necrosis: No Has patient had a PCN reaction that required hospitalization No Has patient had a PCN reaction occurring within the last 10 years: No If all of the above answers are "NO", then may proceed with Cephalosporin use.   Marland Kitchen Propoxyphene Hcl Nausea And Vomiting  . Statins Other (See Comments)    Burns stomach  . Sulfa Antibiotics Other (See Comments)    Burns stomach    Past Medical History:  Diagnosis Date  . Anorexia    HISTORY OF   . Anxiety   . Cancer ([place])    basal cell carcinoma per right side under arm   . DDD (degenerative disc disease), lumbar   . Depression   . GERD (gastroesophageal reflux disease)   . H/O hiatal hernia   . History of stomach ulcers   . Hyperlipidemia   . Kidney stone   . Sciatica    getting prednisone inj/epidural injections  . Tinnitus      Past Surgical History:  Procedure Laterality Date  . APPENDECTOMY  1959  . BACK SURGERY     with rods and screws placed in lumbar area   . BREAST BIOPSY Right    benign cyst  . COLONOSCOPY  2015   dilation of esophagus and polypectomy  . CYST EXCISION Right    foot  . CYSTOSCOPY KIDNEY W/ URETERAL GUIDE WIRE    . ELBOW SURGERY Left 2003  . EXCISION/RELEASE BURSA HIP Left 11/07/2015   Procedure: LEFT HIP BURSECTOMY WITH GLUTEAL TENDON REPAIR;  Surgeon: Gaynelle Arabian, MD;  Location: [facility];  Service: Orthopedics;  Laterality: Left;  . EYE SURGERY Right 2012   right-pterigium  . ROTATOR CUFF REPAIR Left 2003  . TONSILLECTOMY  1976  . UPPER GASTROINTESTINAL ENDOSCOPY  2015    Family History  Problem Relation Age of Onset  . Breast cancer Mother   . Hyperlipidemia Mother        entire family  . Hypertension Mother   . Aneurysm Father        AAA  . Stroke Father   . Breast cancer Paternal Grandmother   . Hypertension Sister   . Hypertension Brother      Social History   Tobacco Use  . Smoking status: Never Smoker  . Smokeless tobacco: Never Used  Vaping Use  . Vaping Use: Never used  Substance Use Topics  . Alcohol use: No  . Drug use: No    ROS   Objective:   Vitals: BP 110/68   Pulse 64   Temp 98 F (36.7 C)   Resp 18   SpO2 97%   Physical Exam Constitutional:      General: She is not in acute distress.    Appearance: Normal appearance. She is well-developed. She is not ill-appearing, toxic-appearing or diaphoretic.  HENT:     Head: Normocephalic and atraumatic.     Nose: Nose normal.     Mouth/Throat:     Mouth: Mucous membranes are moist.     Pharynx: Oropharynx is clear.  Eyes:     General: No scleral icterus.       Right eye: No discharge.        Left eye: No discharge.     Extraocular Movements: Extraocular movements intact.     Conjunctiva/sclera: Conjunctivae normal.     Pupils: Pupils are equal, round, and reactive to light.  Cardiovascular:     Rate and Rhythm: Normal rate and regular rhythm.     Pulses: Normal pulses.     Heart sounds: Normal heart sounds. No murmur heard. No friction rub. No gallop.   Pulmonary:     Effort: Pulmonary effort is normal. No respiratory distress.     Breath sounds: Normal breath sounds. No stridor. No wheezing, rhonchi or rales.  Chest:     Chest wall: Tenderness (along area outlined) present. No deformity, swelling or crepitus.    Skin:    General: Skin is warm and dry.     Findings: No rash.  Neurological:     General: No focal deficit present.     Mental Status: She is alert and oriented to person, place, and time.  Psychiatric:        Mood and Affect: Mood normal.        Behavior: Behavior normal.        Thought Content: Thought content normal.        Judgment: Judgment normal.     DG Ribs Unilateral W/Chest Right  Result Date: 03/03/2021 CLINICAL DATA:  Right rib pain after fall last week. EXAM: RIGHT RIBS AND CHEST - 3+ VIEW COMPARISON:  None.  FINDINGS: No fracture or other bone lesions are seen involving the ribs. There is no evidence of pneumothorax or pleural effusion. Both lungs are clear. Heart size and mediastinal contours are within normal limits. IMPRESSION: Negative. Electronically Signed   By: Marijo Conception M.D.   On: 03/03/2021 08:44    Assessment and Plan :   I have reviewed the PDMP during this encounter.  1. Rib contusion, right, initial encounter   2. Rib pain on right side   3. Accidental fall, initial encounter     Will manage for rib contusion with meloxicam and tizanidine. Rest and modification of physical activity. Counseled patient on potential for adverse effects with medications prescribed/recommended today, ER and return-to-clinic precautions discussed, patient verbalized understanding.    Jaynee Eagles, [place] 03/03/21 [phone]

## 2021-03-03 NOTE — ED Triage Notes (Signed)
Pt presents with right rib pain from fall last week, pt states hard to take deep breath

## 2021-03-30 DIAGNOSIS — M545 Low back pain, unspecified: Secondary | ICD-10-CM | POA: Diagnosis not present

## 2021-03-30 DIAGNOSIS — F5101 Primary insomnia: Secondary | ICD-10-CM | POA: Diagnosis not present

## 2021-03-30 DIAGNOSIS — L719 Rosacea, unspecified: Secondary | ICD-10-CM | POA: Diagnosis not present

## 2021-03-30 DIAGNOSIS — K219 Gastro-esophageal reflux disease without esophagitis: Secondary | ICD-10-CM | POA: Diagnosis not present

## 2021-03-30 DIAGNOSIS — E7800 Pure hypercholesterolemia, unspecified: Secondary | ICD-10-CM | POA: Diagnosis not present

## 2021-03-30 DIAGNOSIS — J301 Allergic rhinitis due to pollen: Secondary | ICD-10-CM | POA: Diagnosis not present

## 2021-03-30 DIAGNOSIS — K589 Irritable bowel syndrome without diarrhea: Secondary | ICD-10-CM | POA: Diagnosis not present

## 2021-03-30 DIAGNOSIS — N183 Chronic kidney disease, stage 3 unspecified: Secondary | ICD-10-CM | POA: Diagnosis not present

## 2021-04-13 DIAGNOSIS — R300 Dysuria: Secondary | ICD-10-CM | POA: Diagnosis not present

## 2021-04-18 ENCOUNTER — Ambulatory Visit (INDEPENDENT_AMBULATORY_CARE_PROVIDER_SITE_OTHER): Payer: Medicare Other

## 2021-04-18 ENCOUNTER — Ambulatory Visit: Payer: Medicare Other | Admitting: Podiatry

## 2021-04-18 ENCOUNTER — Other Ambulatory Visit: Payer: Self-pay

## 2021-04-18 DIAGNOSIS — M21612 Bunion of left foot: Secondary | ICD-10-CM

## 2021-04-18 DIAGNOSIS — S99921A Unspecified injury of right foot, initial encounter: Secondary | ICD-10-CM | POA: Diagnosis not present

## 2021-04-18 DIAGNOSIS — M722 Plantar fascial fibromatosis: Secondary | ICD-10-CM | POA: Diagnosis not present

## 2021-04-18 DIAGNOSIS — M21611 Bunion of right foot: Secondary | ICD-10-CM

## 2021-04-18 DIAGNOSIS — M2061 Acquired deformities of toe(s), unspecified, right foot: Secondary | ICD-10-CM | POA: Diagnosis not present

## 2021-04-18 DIAGNOSIS — M2060 Acquired deformities of toe(s), unspecified, unspecified foot: Secondary | ICD-10-CM

## 2021-04-19 ENCOUNTER — Other Ambulatory Visit: Payer: Self-pay | Admitting: Podiatry

## 2021-04-19 DIAGNOSIS — S99921A Unspecified injury of right foot, initial encounter: Secondary | ICD-10-CM

## 2021-04-19 DIAGNOSIS — M2061 Acquired deformities of toe(s), unspecified, right foot: Secondary | ICD-10-CM

## 2021-04-24 NOTE — Progress Notes (Signed)
Subjective: 70 year old female presents the office today for follow-up evaluation of chronic fasciitis, bunion deformity.  She states that she recently purchased new shoes, [place]. She wants to try them on today to see if they are fitting.  We will try toe separator but was not helpful.  No recent changes otherwise no recent falls or injuries. Denies any systemic complaints such as fevers, chills, nausea, vomiting. No acute changes since last appointment, and no other complaints at this time.   Objective: AAO x3, NAD DP/PT pulses palpable bilaterally, CRT less than 3 seconds Bunions are still present bilaterally there is no erythema on the medial first MPJ still on the bunion site from irritation.  There is no skin breakdown, warmth or any signs of infection.  No pain or restriction with MPJ motion.  There is no area of discomfort identified otherwise.  No significant pain along plantar fascial today. No pain with calf compression, swelling, warmth, erythema  Assessment: Plantar fasciitis, bunion  Plan: -All treatment options discussed with the patient including all alternatives, risks, complications.  -She tried on the new shoes today but appear to be fitting well.  Discussed break-in instructions.  I want her to continue with stretching exercise daily.  Dispensed tube foam for the bunions. -Patient encouraged to call the office with any questions, concerns, change in symptoms.   Trula Slade DPM

## 2021-06-19 ENCOUNTER — Ambulatory Visit: Payer: Medicare Other | Admitting: Podiatry

## 2021-06-19 ENCOUNTER — Other Ambulatory Visit: Payer: Self-pay

## 2021-06-19 ENCOUNTER — Encounter: Payer: Self-pay | Admitting: Podiatry

## 2021-06-19 DIAGNOSIS — M21612 Bunion of left foot: Secondary | ICD-10-CM

## 2021-06-19 DIAGNOSIS — M21611 Bunion of right foot: Secondary | ICD-10-CM

## 2021-06-19 DIAGNOSIS — M722 Plantar fascial fibromatosis: Secondary | ICD-10-CM | POA: Diagnosis not present

## 2021-06-26 NOTE — Progress Notes (Signed)
Subjective: 70 year old female presents the office today for follow evaluation of plantar fasciitis, bunion.  She said that she has had some discomfort in the tips of the toes.  Offloading pads bunionectomy helping.  She is wearing changing shoes as well which has been beneficial.  Still gets some occasional discomfort. Denies any systemic complaints such as fevers, chills, nausea, vomiting. No acute changes since last appointment, and no other complaints at this time.   Objective: AAO x3, NAD DP/PT pulses palpable bilaterally, CRT less than 3 seconds There is no ascending tenderness on the plantar fascia today.  Bunions are evident.  She has had some discomfort of the tips of the toes but no area pinpoint tenderness identified today there is no edema, erythema.  MMT 5/5 No open lesions or pre-ulcerative lesions.  No pain with calf compression, swelling, warmth, erythema  Assessment: Plantar fasciitis, bunion  Plan: -All treatment options discussed with the patient including all alternatives, risks, complications.  -We can discuss treatment options both conservatively as well surgically particularly for the bunion.  If nailbiting continues concerned trim.  Dispensed plantar offloading pads, tube foam for the bunions.  Continue with shoes and good arch supports.  Stretch exercises daily. -Patient encouraged to call the office with any questions, concerns, change in symptoms.   Alice [name] DPM

## 2021-07-15 ENCOUNTER — Other Ambulatory Visit: Payer: Self-pay

## 2021-07-15 ENCOUNTER — Ambulatory Visit
Admission: EM | Admit: 2021-07-15 | Discharge: 2021-07-15 | Disposition: A | Discharge status: Home / Self Care | Payer: Medicare Other | Attending: Internal Medicine | Admitting: Internal Medicine

## 2021-07-15 ENCOUNTER — Ambulatory Visit (INDEPENDENT_AMBULATORY_CARE_PROVIDER_SITE_OTHER): Payer: Medicare Other

## 2021-07-15 ENCOUNTER — Encounter: Payer: Self-pay | Admitting: Emergency Medicine

## 2021-07-15 DIAGNOSIS — S81811A Laceration without foreign body, right lower leg, initial encounter: Secondary | ICD-10-CM | POA: Diagnosis not present

## 2021-07-15 DIAGNOSIS — W19XXXA Unspecified fall, initial encounter: Secondary | ICD-10-CM

## 2021-07-15 DIAGNOSIS — Z23 Encounter for immunization: Secondary | ICD-10-CM

## 2021-07-15 DIAGNOSIS — S8002XA Contusion of left knee, initial encounter: Secondary | ICD-10-CM | POA: Diagnosis not present

## 2021-07-15 DIAGNOSIS — M25562 Pain in left knee: Secondary | ICD-10-CM

## 2021-07-15 DIAGNOSIS — W108XXA Fall (on) (from) other stairs and steps, initial encounter: Secondary | ICD-10-CM

## 2021-07-15 MED ORDER — TETANUS-DIPHTH-ACELL PERTUSSIS 5-2.5-18.5 LF-MCG/0.5 IM SUSY
0.5000 mL | PREFILLED_SYRINGE | Freq: Once | INTRAMUSCULAR | Status: AC
  Administered 2021-07-15: 0.5 mL via INTRAMUSCULAR

## 2021-07-15 NOTE — ED Provider Notes (Signed)
[name]    CSN: [id number] Arrival date & time: 07/15/21  1508      History   Chief Complaint Chief Complaint  Patient presents with   Fall    HPI Alice [name] is a 70 y.o. female.   Patient presents today for further evaluation of bilateral knee pain that has been present after a fall that occurred approximately 3 days ago.  Patient states that she was up on a stepstool adjusting her ceiling fan when she lost her balance and fell.  Has a laceration to her right shin that she states is present from hitting leg on a plastic container on the way down from the fall.  Also has some left knee pain that is present after she landed on her left knee.  Is able to bear weight.  Denies hitting her head or losing consciousness.  Denies any numbness or tingling in either of the lower extremities.  Patient is not sure when her last tetanus vaccine was.   Fall   Past Medical History:  Diagnosis Date   Anorexia    HISTORY OF    Anxiety    Cancer ([place])    basal cell carcinoma per right side under arm    DDD (degenerative disc disease), lumbar    Depression    GERD (gastroesophageal reflux disease)    H/O hiatal hernia    History of stomach ulcers    Hyperlipidemia    Kidney stone    Sciatica    getting prednisone inj/epidural injections   Tinnitus     Patient Active Problem List   Diagnosis Date Noted   Idiopathic peripheral neuropathy 11/18/2020   Pain in left foot 07/14/2019   Closed fracture of fifth metatarsal bone 07/08/2019   Menopausal syndrome 04/04/2018   Greater trochanteric bursitis of left hip 11/07/2015   Trochanteric bursitis of left hip 11/07/2015   Hydrosalpinx 09/19/2015   Spinal stenosis 06/09/2015   Esophageal reflux 07/23/2014   Dysphagia 07/23/2014   Musculoskeletal pain 09/22/2012   Special screening for malignant neoplasms, colon 09/22/2012   Atypical hyperplasia of breast 08/11/2012   IRRITABLE BOWEL SYNDROME 03/21/2009    Constipation 03/02/2009   ABDOMINAL BLOATING 03/02/2009   ABDOMINAL PAIN-EPIGASTRIC 03/02/2009   ABDOMINAL PAIN-MULTIPLE SITES 03/02/2009   COLONIC POLYPS 03/01/2009   DM 03/01/2009   HYPERCHOLESTEROLEMIA 03/01/2009   ANXIETY DEPRESSION 03/01/2009   HYPERTENSION 03/01/2009   ESOPHAGITIS 03/01/2009   GASTRITIS, CHRONIC 03/01/2009   NEPHROLITHIASIS 03/01/2009    Past Surgical History:  Procedure Laterality Date   APPENDECTOMY  1959   BACK SURGERY     with rods and screws placed in lumbar area    BREAST BIOPSY Right    benign cyst   COLONOSCOPY  2015   dilation of esophagus and polypectomy   CYST EXCISION Right    foot   CYSTOSCOPY KIDNEY W/ URETERAL GUIDE WIRE     ELBOW SURGERY Left 2003   EXCISION/RELEASE BURSA HIP Left 11/07/2015   Procedure: LEFT HIP BURSECTOMY WITH GLUTEAL TENDON REPAIR;  Surgeon: Gaynelle Arabian, MD;  Location: [facility];  Service: Orthopedics;  Laterality: Left;   EYE SURGERY Right 2012   right-pterigium   ROTATOR CUFF REPAIR Left 2003   TONSILLECTOMY  1976   UPPER GASTROINTESTINAL ENDOSCOPY  2015    OB History   No obstetric history on file.      Home Medications    Prior to Admission medications   Medication Sig Start Date End Date Taking? Authorizing  Provider  chlordiazePOXIDE (LIBRIUM) 10 MG capsule Take 10 mg by mouth daily as needed (stomach pain).    Yes [provider]  clonazePAM (KLONOPIN) 1 MG tablet Take 1 mg by mouth at bedtime.   Yes [provider]  clonazePAM (KLONOPIN) 2 MG tablet Take 2 mg by mouth at bedtime as needed.   Yes [provider]  doxycycline (ORACEA) 40 MG capsule Take 40 mg by mouth See admin instructions. Take 1 capsule (40 mg) by mouth daily for 3 nights for rosacea breakouts   Yes [provider]  Estradiol-Norethindrone Acet 0.5-0.1 MG tablet Activella 0.5 mg-0.1 mg tablet  Take 1 tablet every day by oral route for 90 days. 08/08/12  Yes [provider]  fluconazole  (DIFLUCAN) 150 MG tablet Take by mouth. 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  03/16/15  Yes [provider]  gabapentin (NEURONTIN) 100 MG capsule Take 100 mg by mouth at bedtime. 10/05/15  Yes [provider]  meclizine (ANTIVERT) 12.5 MG tablet Take 12.5 mg by mouth as needed.   Yes [provider]  meloxicam (MOBIC) 7.5 MG tablet Take 1 tablet (7.5 mg total) by mouth daily. 03/03/21  Yes Jaynee Eagles, PA-C  metroNIDAZOLE (METROCREAM) 0.75 % cream Apply 1 application topically See admin instructions. Applies twice a day during rosacea breakouts 12/02/14  Yes [provider]  metroNIDAZOLE (METROCREAM) 0.75 % cream metronidazole 0.75 % topical cream   Yes [provider]  mupirocin ointment (BACTROBAN) 2 % Apply 1 application topically 2 (two) times daily. Apply with q-tip to nasal opening twice day for 3-5 days. 11/24/19  Yes Yu, Amy V, PA-C  neomycin-polymyxin-hydrocortisone (CORTISPORIN) OTIC solution Apply 1-2 drops to toe after soaking twice a day 02/05/19  Yes Regal, Tamala Fothergill, DPM  nystatin (MYCOSTATIN) 100000 UNIT/ML suspension Take 5 mLs (500,000 Units total) by mouth 4 (four) times daily. 11/04/20  Yes Scot Jun, FNP  Omega-3 Fatty Acids (FISH OIL PO) Fish Oil   Yes [provider]  pantoprazole (PROTONIX) 40 MG tablet Take 1 tablet (40 mg total) by mouth daily. 08/31/14  Yes Inda Castle, MD  phenazopyridine (PYRIDIUM) 200 MG tablet Take 1 tablet (200 mg total) by mouth 3 (three) times daily as needed for pain. 01/31/21  Yes Jaynee Eagles, PA-C  Polyethyl Glycol-Propyl Glycol (SYSTANE OP) Place 1 drop into both eyes daily as needed (dry eyes).   Yes [provider]  Polyethyl Glycol-Propyl Glycol (SYSTANE) 0.4-0.3 % SOLN Apply 1 drop to eye 4 (four) times daily as needed. 06/09/19  Yes Melynda Ripple, MD  sertraline (ZOLOFT)  50 MG tablet Take 50 mg by mouth at bedtime. Pt stated, "I only take a 1/2 tablet" 03/03/15  Yes [provider]  sodium chloride (OCEAN) 0.65 % SOLN nasal spray Place 2 sprays into both nostrils as needed for congestion. 11/24/19  Yes Yu, Amy V, PA-C  tiZANidine (ZANAFLEX) 4 MG tablet Take 1 tablet (4 mg total) by mouth at bedtime. 03/03/21  Yes Jaynee Eagles, PA-C  triamcinolone ointment (KENALOG) 0.5 % Apply 1 application topically 2 (two) times daily. 08/14/19  Yes Hall-Potvin, [place], PA-C  trimethoprim-polymyxin b (POLYTRIM) ophthalmic solution Place 1 drop into the left eye every 4 (four) hours. 05/31/20  Yes Hall-Potvin, [place], PA-C    Family History Family History  Problem Relation Age of Onset   Breast cancer Mother  Hyperlipidemia Mother        entire family   Hypertension Mother    Aneurysm Father        AAA   Stroke Father    Breast cancer Paternal Grandmother    Hypertension Sister    Hypertension Brother     Social History Social History   Tobacco Use   Smoking status: Never   Smokeless tobacco: Never  Vaping Use   Vaping Use: Never used  Substance Use Topics   Alcohol use: No   Drug use: No     Allergies   Bactrim [sulfamethoxazole-trimethoprim], Penicillins, Propoxyphene hcl, Statins, and Sulfa antibiotics   Review of Systems Review of Systems Per HPI  Physical Exam Triage Vital Signs ED Triage Vitals  Enc Vitals Group     BP 07/15/21 1600 109/69     Pulse Rate 07/15/21 1600 69     Resp 07/15/21 1600 18     Temp 07/15/21 1600 98.4 F (36.9 C)     Temp Source 07/15/21 1600 Oral     SpO2 07/15/21 1600 97 %     Weight 07/15/21 1602 150 lb (68 kg)     Height 07/15/21 1602 '5\' 5"'$  (1.651 m)     Head Circumference --      Peak Flow --      Pain Score 07/15/21 1601 8     Pain Loc --      Pain Edu? --      Excl. in [place]? --    No data found.  Updated Vital Signs BP 109/69 (BP Location: Left Arm)   Pulse 69   Temp 98.4 F (36.9 C)  (Oral)   Resp 18   Ht '5\' 5"'$  (1.651 m)   Wt 150 lb (68 kg)   SpO2 97%   BMI 24.96 kg/m   Visual Acuity Right Eye Distance:   Left Eye Distance:   Bilateral Distance:    Right Eye Near:   Left Eye Near:    Bilateral Near:     Physical Exam Constitutional:      Appearance: Normal appearance.  HENT:     Head: Normocephalic and atraumatic.  Eyes:     Extraocular Movements: Extraocular movements intact.     Conjunctiva/sclera: Conjunctivae normal.  Pulmonary:     Effort: Pulmonary effort is normal.  Musculoskeletal:     Left knee: Swelling present. No deformity or bony tenderness. Tenderness present over the medial joint line. Normal alignment. Normal pulse.     Comments: Tenderness to palpation and bruising present to left knee directly above patella.  Neurovascular intact.  Skin:    Findings: Laceration present.     Comments: Approximately 2 inch in length laceration present to right upper shin.  Wound is closed and well approximated.  No signs of infection.  Neurovascular intact.  Mild bruising and swelling surrounding laceration.  Neurological:     General: No focal deficit present.     Mental Status: She is alert and oriented to person, place, and time. Mental status is at baseline.  Psychiatric:        Mood and Affect: Mood normal.        Behavior: Behavior normal.        Thought Content: Thought content normal.        Judgment: Judgment normal.     UC Treatments / Results  Labs (all labs ordered are listed, but only abnormal results are displayed) Labs Reviewed - No data to display  EKG  Radiology DG Knee Complete 4 Views Left  Result Date: 07/15/2021 CLINICAL DATA:  Fall with lateral left knee pain. EXAM: LEFT KNEE - COMPLETE 4+ VIEW COMPARISON:  None. FINDINGS: No evidence of fracture, dislocation, or joint effusion. No evidence of arthropathy or other focal bone abnormality. Soft tissues are unremarkable. IMPRESSION: Negative. Electronically Signed   By:  Zerita Boers M.D.   On: 07/15/2021 16:03    Procedures Procedures (including critical care time)  Medications Ordered in UC Medications  Tdap (BOOSTRIX) injection 0.5 mL (0.5 mLs Intramuscular Given 07/15/21 1641)    Initial Impression / Assessment and Plan / UC Course  I have reviewed the triage vital signs and the nursing notes.  Pertinent labs & imaging results that were available during my care of the patient were reviewed by me and considered in my medical decision making (see chart for details).     Left knee x-ray negative for any acute bony abnormality.  Contusion is present to left upper knee.  Patient to use ice application to decrease inflammation.  Laceration to right shin is healing well and there are no signs of infection.  Advised patient to monitor for signs of infection and to follow-up if these occur.  Tetanus vaccine was updated in urgent care today.  Patient was provided with contact information for orthopedist if pain does not resolve in the next 1 to 2 weeks.Discussed strict return precautions. Patient verbalized understanding and is agreeable with plan.  Final Clinical Impressions(s) / UC Diagnoses   Final diagnoses:  Contusion of left knee, initial encounter  Laceration of right lower leg, initial encounter  Fall (on) (from) other stairs and steps, initial encounter     Discharge Instructions      X-ray was negative for any fracture.  Suspect contusion/bruising related to pain.  Please follow-up with orthopedic sports medicine provided contact information if pain persists.  Please monitor laceration to right leg for signs of infection that include increased redness, swelling, pus, fever.  Follow-up if these occur.  You were given tetanus vaccine today in urgent care.     ED Prescriptions   None    PDMP not reviewed this encounter.   Odis Luster, FNP 07/15/21 1704

## 2021-07-15 NOTE — ED Triage Notes (Signed)
Patient states that she fell on Thursday and injured her left knee and has a laceration on her right shin.  The left knee has bruised.  Patient is able to walk w/o difficulty and applying ice to the areas.  Patient is unsure of her last Tdap.

## 2021-07-15 NOTE — Discharge Instructions (Addendum)
X-ray was negative for any fracture.  Suspect contusion/bruising related to pain.  Please follow-up with orthopedic sports medicine provided contact information if pain persists.  Please monitor laceration to right leg for signs of infection that include increased redness, swelling, pus, fever.  Follow-up if these occur.  You were given tetanus vaccine today in urgent care.

## 2021-07-25 DIAGNOSIS — S8002XA Contusion of left knee, initial encounter: Secondary | ICD-10-CM | POA: Diagnosis not present

## 2021-07-25 DIAGNOSIS — L03115 Cellulitis of right lower limb: Secondary | ICD-10-CM | POA: Diagnosis not present

## 2021-08-03 DIAGNOSIS — M25462 Effusion, left knee: Secondary | ICD-10-CM | POA: Diagnosis not present

## 2021-08-08 DIAGNOSIS — M7042 Prepatellar bursitis, left knee: Secondary | ICD-10-CM | POA: Diagnosis not present

## 2021-08-17 DIAGNOSIS — M7042 Prepatellar bursitis, left knee: Secondary | ICD-10-CM | POA: Diagnosis not present

## 2021-08-31 DIAGNOSIS — M7042 Prepatellar bursitis, left knee: Secondary | ICD-10-CM | POA: Diagnosis not present

## 2021-09-04 ENCOUNTER — Other Ambulatory Visit: Payer: Self-pay | Admitting: Family Medicine

## 2021-09-04 DIAGNOSIS — Z1231 Encounter for screening mammogram for malignant neoplasm of breast: Secondary | ICD-10-CM

## 2021-09-11 ENCOUNTER — Ambulatory Visit: Payer: Medicare Other | Admitting: Podiatry

## 2021-09-22 DIAGNOSIS — M7042 Prepatellar bursitis, left knee: Secondary | ICD-10-CM | POA: Diagnosis not present

## 2021-09-29 DIAGNOSIS — M25562 Pain in left knee: Secondary | ICD-10-CM | POA: Diagnosis not present

## 2021-09-29 DIAGNOSIS — M545 Low back pain, unspecified: Secondary | ICD-10-CM | POA: Diagnosis not present

## 2021-09-29 DIAGNOSIS — E7800 Pure hypercholesterolemia, unspecified: Secondary | ICD-10-CM | POA: Diagnosis not present

## 2021-09-29 DIAGNOSIS — N183 Chronic kidney disease, stage 3 unspecified: Secondary | ICD-10-CM | POA: Diagnosis not present

## 2021-09-29 DIAGNOSIS — L989 Disorder of the skin and subcutaneous tissue, unspecified: Secondary | ICD-10-CM | POA: Diagnosis not present

## 2021-09-29 DIAGNOSIS — K219 Gastro-esophageal reflux disease without esophagitis: Secondary | ICD-10-CM | POA: Diagnosis not present

## 2021-09-29 DIAGNOSIS — K589 Irritable bowel syndrome without diarrhea: Secondary | ICD-10-CM | POA: Diagnosis not present

## 2021-09-29 DIAGNOSIS — L719 Rosacea, unspecified: Secondary | ICD-10-CM | POA: Diagnosis not present

## 2021-09-29 DIAGNOSIS — F5101 Primary insomnia: Secondary | ICD-10-CM | POA: Diagnosis not present

## 2021-09-29 DIAGNOSIS — J301 Allergic rhinitis due to pollen: Secondary | ICD-10-CM | POA: Diagnosis not present

## 2021-10-19 DIAGNOSIS — M7042 Prepatellar bursitis, left knee: Secondary | ICD-10-CM | POA: Diagnosis not present

## 2021-10-20 ENCOUNTER — Other Ambulatory Visit: Payer: Self-pay

## 2021-10-20 ENCOUNTER — Ambulatory Visit
Admission: RE | Admit: 2021-10-20 | Discharge: 2021-10-20 | Disposition: A | Discharge status: Home / Self Care | Payer: Medicare Other | Source: Ambulatory Visit | Attending: Family Medicine | Admitting: Family Medicine

## 2021-10-20 DIAGNOSIS — Z1231 Encounter for screening mammogram for malignant neoplasm of breast: Secondary | ICD-10-CM

## 2021-11-09 DIAGNOSIS — M549 Dorsalgia, unspecified: Secondary | ICD-10-CM | POA: Diagnosis not present

## 2021-11-09 DIAGNOSIS — N3001 Acute cystitis with hematuria: Secondary | ICD-10-CM | POA: Diagnosis not present

## 2021-11-16 DIAGNOSIS — M533 Sacrococcygeal disorders, not elsewhere classified: Secondary | ICD-10-CM | POA: Diagnosis not present

## 2021-11-18 DIAGNOSIS — H60501 Unspecified acute noninfective otitis externa, right ear: Secondary | ICD-10-CM | POA: Diagnosis not present

## 2021-12-11 ENCOUNTER — Other Ambulatory Visit: Payer: Self-pay | Admitting: Family Medicine

## 2021-12-11 DIAGNOSIS — Z1231 Encounter for screening mammogram for malignant neoplasm of breast: Secondary | ICD-10-CM

## 2021-12-12 DIAGNOSIS — M533 Sacrococcygeal disorders, not elsewhere classified: Secondary | ICD-10-CM | POA: Insufficient documentation

## 2021-12-13 DIAGNOSIS — M533 Sacrococcygeal disorders, not elsewhere classified: Secondary | ICD-10-CM | POA: Diagnosis not present

## 2021-12-20 DIAGNOSIS — L814 Other melanin hyperpigmentation: Secondary | ICD-10-CM | POA: Diagnosis not present

## 2021-12-20 DIAGNOSIS — D225 Melanocytic nevi of trunk: Secondary | ICD-10-CM | POA: Diagnosis not present

## 2021-12-20 DIAGNOSIS — D485 Neoplasm of uncertain behavior of skin: Secondary | ICD-10-CM | POA: Diagnosis not present

## 2021-12-20 DIAGNOSIS — C4441 Basal cell carcinoma of skin of scalp and neck: Secondary | ICD-10-CM | POA: Diagnosis not present

## 2021-12-20 DIAGNOSIS — L821 Other seborrheic keratosis: Secondary | ICD-10-CM | POA: Diagnosis not present

## 2021-12-20 DIAGNOSIS — L218 Other seborrheic dermatitis: Secondary | ICD-10-CM | POA: Diagnosis not present

## 2022-01-23 DIAGNOSIS — L309 Dermatitis, unspecified: Secondary | ICD-10-CM | POA: Diagnosis not present

## 2022-01-23 DIAGNOSIS — J3489 Other specified disorders of nose and nasal sinuses: Secondary | ICD-10-CM | POA: Diagnosis not present

## 2022-01-30 DIAGNOSIS — B370 Candidal stomatitis: Secondary | ICD-10-CM | POA: Diagnosis not present

## 2022-01-30 DIAGNOSIS — L309 Dermatitis, unspecified: Secondary | ICD-10-CM | POA: Diagnosis not present

## 2022-02-27 DIAGNOSIS — C4441 Basal cell carcinoma of skin of scalp and neck: Secondary | ICD-10-CM | POA: Diagnosis not present

## 2022-03-03 ENCOUNTER — Other Ambulatory Visit: Payer: Self-pay

## 2022-03-03 ENCOUNTER — Ambulatory Visit
Admission: EM | Admit: 2022-03-03 | Discharge: 2022-03-03 | Disposition: A | Discharge status: Home / Self Care | Payer: Medicare Other | Attending: Physician Assistant | Admitting: Physician Assistant

## 2022-03-03 ENCOUNTER — Encounter: Payer: Self-pay | Admitting: Emergency Medicine

## 2022-03-03 DIAGNOSIS — H60502 Unspecified acute noninfective otitis externa, left ear: Secondary | ICD-10-CM

## 2022-03-03 MED ORDER — CIPROFLOXACIN-DEXAMETHASONE 0.3-0.1 % OT SUSP: 4.0000 [drp] | Freq: Two times a day (BID) | OTIC | 0 refills | Status: AC

## 2022-03-03 NOTE — ED Triage Notes (Signed)
Deroofed lump inside left ear canal. Just had basal cell carcinoma removed from right neck  ?

## 2022-03-04 NOTE — ED Provider Notes (Signed)
?[facility] ? ? ? ?CSN: [id number] ?Arrival date & time: 03/03/22  1124 ? ? ?  ? ?History   ?Chief Complaint ?Chief Complaint  ?Patient presents with  ? Ear Problem  ? ? ?HPI ?Alice [name] is a 71 y.o. female.  ? ?Patient here today for evaluation of left ear pain that has been present the last few days. She reports mild improvement of pain today but notes that pain is worse when she moves her auricle and tragus, or inserts her finger into her ear. She has not had fever. She denies any symptoms to right ear. She did have [place] removed from neck to right side earlier this week.  ? ?The history is provided by the patient.  ? ?Past Medical History:  ?Diagnosis Date  ? Anorexia   ? HISTORY OF   ? Anxiety   ? Cancer [facility])   ? basal cell carcinoma per right side under arm   ? DDD (degenerative disc disease), lumbar   ? Depression   ? GERD (gastroesophageal reflux disease)   ? H/O hiatal hernia   ? History of stomach ulcers   ? Hyperlipidemia   ? Kidney stone   ? Sciatica   ? getting prednisone inj/epidural injections  ? Tinnitus   ? ? ?Patient Active Problem List  ? Diagnosis Date Noted  ? Idiopathic peripheral neuropathy 11/18/2020  ? Pain in left foot 07/14/2019  ? Closed fracture of fifth metatarsal bone 07/08/2019  ? Menopausal syndrome 04/04/2018  ? Greater trochanteric bursitis of left hip 11/07/2015  ? Trochanteric bursitis of left hip 11/07/2015  ? Hydrosalpinx 09/19/2015  ? Spinal stenosis 06/09/2015  ? Esophageal reflux 07/23/2014  ? Dysphagia 07/23/2014  ? Musculoskeletal pain 09/22/2012  ? Special screening for malignant neoplasms, colon 09/22/2012  ? Atypical hyperplasia of breast 08/11/2012  ? IRRITABLE BOWEL SYNDROME 03/21/2009  ? Constipation 03/02/2009  ? ABDOMINAL BLOATING 03/02/2009  ? ABDOMINAL PAIN-EPIGASTRIC 03/02/2009  ? ABDOMINAL PAIN-MULTIPLE SITES 03/02/2009  ? COLONIC POLYPS 03/01/2009  ? DM 03/01/2009  ? HYPERCHOLESTEROLEMIA 03/01/2009  ? ANXIETY DEPRESSION 03/01/2009  ?  HYPERTENSION 03/01/2009  ? ESOPHAGITIS 03/01/2009  ? GASTRITIS, CHRONIC 03/01/2009  ? NEPHROLITHIASIS 03/01/2009  ? ? ?Past Surgical History:  ?Procedure Laterality Date  ? APPENDECTOMY  1959  ? BACK SURGERY    ? with rods and screws placed in lumbar area   ? BREAST BIOPSY Right   ? benign cyst  ? COLONOSCOPY  2015  ? dilation of esophagus and polypectomy  ? CYST EXCISION Right   ? foot  ? CYSTOSCOPY KIDNEY W/ URETERAL GUIDE WIRE    ? ELBOW SURGERY Left 2003  ? EXCISION/RELEASE BURSA HIP Left 11/07/2015  ? Procedure: LEFT HIP BURSECTOMY WITH GLUTEAL TENDON REPAIR;  Surgeon: Gaynelle Arabian, MD;  Location: [facility];  Service: Orthopedics;  Laterality: Left;  ? EYE SURGERY Right 2012  ? right-pterigium  ? ROTATOR CUFF REPAIR Left 2003  ? TONSILLECTOMY  1976  ? UPPER GASTROINTESTINAL ENDOSCOPY  2015  ? ? ?OB History   ?No obstetric history on file. ?  ? ? ? ?Home Medications   ? ?Prior to Admission medications   ?Medication Sig Start Date End Date Taking? Authorizing Provider  ?ciprofloxacin-dexamethasone (CIPRODEX) OTIC suspension Place 4 drops into the left ear 2 (two) times daily for 7 days. 03/03/22 03/10/22 Yes Francene Finders, PA-C  ?chlordiazePOXIDE (LIBRIUM) 10 MG capsule Take 10 mg by mouth daily as needed (stomach pain).     [provider]  ?clonazePAM (KLONOPIN) 1 MG tablet Take 1 mg by mouth at bedtime.    [provider]  ?clonazePAM (KLONOPIN) 2 MG tablet Take 2 mg by mouth at bedtime as needed.    [provider]  ?doxycycline (ORACEA) 40 MG capsule Take 40 mg by mouth See admin instructions. Take 1 capsule (40 mg) by mouth daily for 3 nights for rosacea breakouts    [provider]  ?Estradiol-Norethindrone Acet 0.5-0.1 MG tablet Activella 0.5 mg-0.1 mg tablet ? Take 1 tablet every day by oral route for 90 days. 08/08/12   [provider]  ?fluconazole (DIFLUCAN) 150 MG tablet Take by mouth. 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  03/16/15   [provider]  ?gabapentin (NEURONTIN) 100 MG capsule Take 100 mg by mouth at bedtime. 10/05/15   [provider]  ?meclizine (ANTIVERT) 12.5 MG tablet Take 12.5 mg by mouth as needed.    [provider]  ?meloxicam (MOBIC) 7.5 MG tablet Take 1 tablet (7.5 mg total) by mouth daily. 03/03/21   Jaynee Eagles, PA-C  ?metroNIDAZOLE (METROCREAM) 0.75 % cream Apply 1 application topically See admin instructions. Applies twice a day during rosacea breakouts 12/02/14   [provider]  ?metroNIDAZOLE (METROCREAM) 0.75 % cream metronidazole 0.75 % topical cream    [provider]  ?mupirocin ointment (BACTROBAN) 2 % Apply 1 application topically 2 (two) times daily. Apply with q-tip to nasal opening twice day for 3-5 days. 11/24/19   Ok Edwards, PA-C  ?neomycin-polymyxin-hydrocortisone (CORTISPORIN) OTIC solution Apply 1-2 drops to toe after soaking twice a day 02/05/19   Wallene Huh, DPM  ?nystatin (MYCOSTATIN) 100000 UNIT/ML suspension Take 5 mLs (500,000 Units total) by mouth 4 (four) times daily. 11/04/20   Scot Jun, FNP  ?Omega-3 Fatty Acids (FISH OIL PO) Fish Oil    [provider]  ?pantoprazole (PROTONIX) 40 MG tablet Take 1 tablet (40 mg total) by mouth daily. 08/31/14   Inda Castle, MD  ?phenazopyridine (PYRIDIUM) 200 MG tablet Take 1 tablet (200 mg total) by mouth 3 (three) times daily as needed for pain. 01/31/21   Jaynee Eagles, PA-C  ?Polyethyl Glycol-Propyl Glycol (SYSTANE OP) Place 1 drop into both eyes daily as needed (dry eyes).    [provider]  ?Polyethyl Glycol-Propyl Glycol (SYSTANE) 0.4-0.3 % SOLN Apply 1 drop to eye 4 (four) times daily as needed. 06/09/19   Melynda Ripple, MD  ?sertraline (ZOLOFT) 50 MG tablet Take 50 mg by mouth at bedtime. Pt stated, "I only take a 1/2 tablet" 03/03/15   [provider]  ?sodium chloride (OCEAN) 0.65 % SOLN  nasal spray Place 2 sprays into both nostrils as needed for congestion. 11/24/19   Tasia Catchings, Amy V, PA-C  ?tiZANidine (ZANAFLEX) 4 MG tablet Take 1 tablet (4 mg total) by mouth at bedtime. 03/03/21   Jaynee Eagles, PA-C  ?triamcinolone ointment (KENALOG) 0.5 % Apply 1 application topically 2 (two) times daily. 08/14/19   Hall-Potvin, [place], PA-C  ?trimethoprim-polymyxin b (POLYTRIM) ophthalmic solution Place 1 drop into the left eye every 4 (four) hours. 05/31/20   Hall-Potvin, [place], PA-C  ? ? ?Family History ?Family History  ?Problem Relation Age of Onset  ? Breast cancer Mother   ? Hyperlipidemia Mother   ?     entire family  ? Hypertension Mother   ? Aneurysm Father   ?  AAA  ? Stroke Father   ? Hypertension Sister   ? Breast cancer Paternal Grandmother   ? Hypertension Brother   ? ? ?Social History ?Social History  ? ?Tobacco Use  ? Smoking status: Never  ? Smokeless tobacco: Never  ?Vaping Use  ? Vaping Use: Never used  ?Substance Use Topics  ? Alcohol use: No  ? Drug use: No  ? ? ? ?Allergies   ?Bactrim [sulfamethoxazole-trimethoprim], Penicillins, Propoxyphene hcl, Statins, and Sulfa antibiotics ? ? ?Review of Systems ?Review of Systems  ?Constitutional:  Negative for chills and fever.  ?HENT:  Positive for ear pain. Negative for congestion and ear discharge.   ?Eyes:  Negative for discharge and redness.  ?Respiratory:  Negative for cough and shortness of breath.   ?Gastrointestinal:  Negative for abdominal pain, diarrhea, nausea and vomiting.  ?Genitourinary:  Positive for vaginal bleeding and vaginal discharge.  ? ? ?Physical Exam ?Triage Vital Signs ?ED Triage Vitals [03/03/22 1521]  ?Enc Vitals Group  ?   BP 130/75  ?   Pulse Rate 64  ?   Resp 16  ?   Temp 98 ?F (36.7 ?C)  ?   Temp Source Oral  ?   SpO2 97 %  ?   Weight   ?   Height   ?   Head Circumference   ?   Peak Flow   ?   Pain Score 0  ?   Pain Loc   ?   Pain Edu?   ?   Excl. in [place]?   ? ?No data found. ? ?Updated Vital Signs ?BP 130/75 (BP Location:  Left Arm)   Pulse 64   Temp 98 ?F (36.7 ?C) (Oral)   Resp 16   SpO2 97%  ?   ? ?Physical Exam ?Vitals and nursing note reviewed.  ?Constitutional:   ?   General: She is not in acute distress. ?   Appearance: Normal

## 2022-04-03 DIAGNOSIS — R252 Cramp and spasm: Secondary | ICD-10-CM | POA: Diagnosis not present

## 2022-04-03 DIAGNOSIS — E7800 Pure hypercholesterolemia, unspecified: Secondary | ICD-10-CM | POA: Diagnosis not present

## 2022-04-03 DIAGNOSIS — K589 Irritable bowel syndrome without diarrhea: Secondary | ICD-10-CM | POA: Diagnosis not present

## 2022-04-03 DIAGNOSIS — N183 Chronic kidney disease, stage 3 unspecified: Secondary | ICD-10-CM | POA: Diagnosis not present

## 2022-04-03 DIAGNOSIS — F5101 Primary insomnia: Secondary | ICD-10-CM | POA: Diagnosis not present

## 2022-04-03 DIAGNOSIS — K219 Gastro-esophageal reflux disease without esophagitis: Secondary | ICD-10-CM | POA: Diagnosis not present

## 2022-04-03 DIAGNOSIS — M545 Low back pain, unspecified: Secondary | ICD-10-CM | POA: Diagnosis not present

## 2022-05-01 DIAGNOSIS — M545 Low back pain, unspecified: Secondary | ICD-10-CM | POA: Insufficient documentation

## 2022-05-10 ENCOUNTER — Other Ambulatory Visit: Payer: Self-pay

## 2022-05-10 ENCOUNTER — Emergency Department (HOSPITAL_BASED_OUTPATIENT_CLINIC_OR_DEPARTMENT_OTHER)
Admission: EM | Admit: 2022-05-10 | Discharge: 2022-05-10 | Disposition: A | Discharge status: Home / Self Care | Payer: Medicare Other | Attending: Emergency Medicine | Admitting: Emergency Medicine

## 2022-05-10 ENCOUNTER — Encounter (HOSPITAL_BASED_OUTPATIENT_CLINIC_OR_DEPARTMENT_OTHER): Payer: Self-pay

## 2022-05-10 ENCOUNTER — Emergency Department (HOSPITAL_BASED_OUTPATIENT_CLINIC_OR_DEPARTMENT_OTHER): Payer: Medicare Other

## 2022-05-10 DIAGNOSIS — N189 Chronic kidney disease, unspecified: Secondary | ICD-10-CM | POA: Insufficient documentation

## 2022-05-10 DIAGNOSIS — M79605 Pain in left leg: Secondary | ICD-10-CM | POA: Insufficient documentation

## 2022-05-10 DIAGNOSIS — R519 Headache, unspecified: Secondary | ICD-10-CM | POA: Diagnosis not present

## 2022-05-10 DIAGNOSIS — M79604 Pain in right leg: Secondary | ICD-10-CM | POA: Diagnosis not present

## 2022-05-10 MED ORDER — ACETAMINOPHEN-CAFFEINE 500-65 MG PO TABS: 1.0000 | ORAL_TABLET | Freq: Once | ORAL | Status: DC

## 2022-05-10 NOTE — Discharge Instructions (Signed)
Follow-up with your primary care doctor.  Continue taking Tylenol or Excedrin Tension Headache as needed for pain control.   Please come back to ER if you are having uncontrolled headache, vomiting, neck stiffness, fever or other new concerning symptom.

## 2022-05-10 NOTE — ED Provider Notes (Signed)
[facility] DEPT Provider Note   CSN: [id number] Arrival date & time: 05/10/22  1246     History  Chief Complaint  Patient presents with   Headache   Leg Pain    Alice [name] is a 71 y.o. female.  Presented to the emergency department due to concern for headache.  Patient reports that she had a headache over the past week at 1 point did seem to radiate down her neck but currently does not have any neck pain or neck stiffness.  Not sudden onset, not worst headache of her life but typically does not get headaches.  No chills or fevers.  Headache is currently moderate.  Did get some significant relief with taking Excedrin tension headache.  She reports history of CKD and was told not to take NSAIDs.  As secondary complaint patient also states that she is having cramping in both of her legs, seems to occur primarily at night.  Currently does not have any pain or discomfort in her legs.  Has not noticed any swelling in her legs.  Occurs in both legs equally.  She mentions to her primary care doctor at an office visit a few weeks ago but they did not do any further investigation.  HPI     Home Medications Prior to Admission medications   Medication Sig Start Date End Date Taking? Authorizing Provider  chlordiazePOXIDE (LIBRIUM) 10 MG capsule Take 10 mg by mouth daily as needed (stomach pain).     [provider]  clonazePAM (KLONOPIN) 1 MG tablet Take 1 mg by mouth at bedtime.    [provider]  clonazePAM (KLONOPIN) 2 MG tablet Take 2 mg by mouth at bedtime as needed.    [provider]  doxycycline (ORACEA) 40 MG capsule Take 40 mg by mouth See admin instructions. Take 1 capsule (40 mg) by mouth daily for 3 nights for rosacea breakouts    [provider]  Estradiol-Norethindrone Acet 0.5-0.1 MG tablet Activella 0.5 mg-0.1 mg tablet  Take 1 tablet every day by oral route for 90 days. 08/08/12   [provider]   fluconazole (DIFLUCAN) 150 MG tablet Take by mouth. 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  03/16/15   [provider]  gabapentin (NEURONTIN) 100 MG capsule Take 100 mg by mouth at bedtime. 10/05/15   [provider]  meclizine (ANTIVERT) 12.5 MG tablet Take 12.5 mg by mouth as needed.    [provider]  meloxicam (MOBIC) 7.5 MG tablet Take 1 tablet (7.5 mg total) by mouth daily. 03/03/21   Jaynee Eagles, PA-C  metroNIDAZOLE (METROCREAM) 0.75 % cream Apply 1 application topically See admin instructions. Applies twice a day during rosacea breakouts 12/02/14   [provider]  mupirocin ointment (BACTROBAN) 2 % Apply 1 application topically 2 (two) times daily. Apply with q-tip to nasal opening twice day for 3-5 days. 11/24/19   Ok Edwards, PA-C  neomycin-polymyxin-hydrocortisone (CORTISPORIN) OTIC solution Apply 1-2 drops to toe after soaking twice a day 02/05/19   Wallene Huh, DPM  nystatin (MYCOSTATIN) 100000 UNIT/ML suspension Take 5 mLs (500,000 Units total) by mouth 4 (four) times daily. 11/04/20   Scot Jun, FNP  Omega-3 Fatty Acids (FISH OIL PO) Fish Oil    [provider]  pantoprazole (PROTONIX) 40 MG tablet Take 1 tablet (40 mg total) by mouth daily. 08/31/14  Inda Castle, MD  phenazopyridine (PYRIDIUM) 200 MG tablet Take 1 tablet (200 mg total) by mouth 3 (three) times daily as needed for pain. 01/31/21   Jaynee Eagles, PA-C  Polyethyl Glycol-Propyl Glycol (SYSTANE) 0.4-0.3 % SOLN Apply 1 drop to eye 4 (four) times daily as needed. 06/09/19   Melynda Ripple, MD  sertraline (ZOLOFT) 50 MG tablet Take 50 mg by mouth at bedtime. Pt stated, "I only take a 1/2 tablet" 03/03/15   [provider]  sodium chloride (OCEAN) 0.65 % SOLN nasal spray Place 2 sprays into both nostrils as needed for congestion. 11/24/19   Tasia Catchings, Amy V, PA-C   tiZANidine (ZANAFLEX) 4 MG tablet Take 1 tablet (4 mg total) by mouth at bedtime. 03/03/21   Jaynee Eagles, PA-C  triamcinolone ointment (KENALOG) 0.5 % Apply 1 application topically 2 (two) times daily. 08/14/19   Hall-Potvin, [place], PA-C  trimethoprim-polymyxin b (POLYTRIM) ophthalmic solution Place 1 drop into the left eye every 4 (four) hours. 05/31/20   Hall-Potvin, [place], PA-C      Allergies    Bactrim [sulfamethoxazole-trimethoprim], Penicillins, Propoxyphene hcl, Statins, and Sulfa antibiotics    Review of Systems   Review of Systems  Constitutional:  Negative for chills and fever.  HENT:  Negative for ear pain and sore throat.   Eyes:  Negative for pain and visual disturbance.  Respiratory:  Negative for cough and shortness of breath.   Cardiovascular:  Negative for chest pain and palpitations.  Gastrointestinal:  Negative for abdominal pain and vomiting.  Genitourinary:  Negative for dysuria and hematuria.  Musculoskeletal:  Positive for arthralgias. Negative for back pain.  Skin:  Negative for color change and rash.  Neurological:  Positive for headaches. Negative for seizures and syncope.  All other systems reviewed and are negative.   Physical Exam Updated Vital Signs BP 132/70 (BP Location: Left Arm)   Pulse (!) 56   Temp 97.9 F (36.6 C) (Oral)   Resp 16   SpO2 100%  Physical Exam Vitals and nursing note reviewed.  Constitutional:      General: She is not in acute distress.    Appearance: She is well-developed.  HENT:     Head: Normocephalic and atraumatic.  Eyes:     Conjunctiva/sclera: Conjunctivae normal.  Cardiovascular:     Rate and Rhythm: Normal rate and regular rhythm.     Heart sounds: No murmur heard. Pulmonary:     Effort: Pulmonary effort is normal. No respiratory distress.     Breath sounds: Normal breath sounds.  Abdominal:     Palpations: Abdomen is soft.     Tenderness: There is no abdominal tenderness.  Musculoskeletal:         General: No swelling.     Cervical back: Neck supple.     Comments: Bilateral legs appear normal, feet feel warm and well-perfused, normal DP and PT pulses bilaterally, no tenderness to palpation in calf or thigh  Skin:    General: Skin is warm and dry.     Capillary Refill: Capillary refill takes less than 2 seconds.  Neurological:     Mental Status: She is alert.  Psychiatric:        Mood and Affect: Mood normal.     ED Results / Procedures / Treatments   Labs (all labs ordered are listed, but only abnormal results are displayed) Labs Reviewed - No data to display  EKG None  Radiology CT Head Wo Contrast  Result Date: 05/10/2022 CLINICAL DATA:  New or worsening headache, headache radiating to neck for 1 week, BILATERAL leg pain EXAM: CT HEAD WITHOUT CONTRAST TECHNIQUE: Contiguous axial images were obtained from the base of the skull through the vertex without intravenous contrast. RADIATION DOSE REDUCTION: This exam was performed according to the departmental dose-optimization program which includes automated exposure control, adjustment of the mA and/or kV according to patient size and/or use of iterative reconstruction technique. COMPARISON:  None FINDINGS: Brain: Generalized atrophy. Normal ventricular morphology. No midline shift or mass effect. Small vessel chronic ischemic changes of deep cerebral white matter. No intracranial hemorrhage, mass lesion, evidence of acute infarction, or extra-axial fluid collection. Vascular: Atherosclerotic calcification of internal carotid and vertebral arteries at skull base Skull: Intact Sinuses/Orbits: Clear Other: N/A IMPRESSION: Atrophy with small vessel chronic ischemic changes of deep cerebral white matter. No acute intracranial abnormalities. Electronically Signed   By: Lavonia Dana M.D.   On: 05/10/2022 14:50    Procedures Procedures    Medications Ordered in ED Medications - No data to display  ED Course/ Medical Decision Making/ A&P                            Medical Decision Making Amount and/or Complexity of Data Reviewed Radiology: ordered.   70 related presenting to ER due to concern for headache as well as cramping in her legs.  Regarding her headache, check CT head, no acute findings.  She appears well, minimal ongoing symptoms at this time.  No tenderness over temporal region, no neck pain or stiffness or fever at this time.  Suspect tension type headache, recommend supportive care, follow-up with PCP.  Regarding her leg complaints, she does not have any leg pain or cramping at this time, no tenderness in her calf or thigh region, no appreciable swelling on exam, doubt DVT.  She has normal pedal pulses, equal bilaterally, doubt significant PAD.  I advised patient to discuss further with her primary care doctor.    Reviewed return precautions and discharge patient home.  After the discussed management above, the patient was determined to be safe for discharge.  The patient was in agreement with this plan and all questions regarding their care were answered.  ED return precautions were discussed and the patient will return to the ED with any significant worsening of condition.         Final Clinical Impression(s) / ED Diagnoses Final diagnoses:  Nonintractable headache, unspecified chronicity pattern, unspecified headache type    Rx / DC Orders ED Discharge Orders     None         Lucrezia Starch, MD 05/11/22 [phone]

## 2022-05-10 NOTE — ED Notes (Signed)
Patient transported to CT 

## 2022-05-10 NOTE — ED Triage Notes (Signed)
She c/o generalized h/a, radiating down neck; x 1 week. She further c/o chronic bilat. Lower leg "pain, especially at night".

## 2022-05-12 ENCOUNTER — Other Ambulatory Visit: Payer: Self-pay

## 2022-05-12 ENCOUNTER — Ambulatory Visit
Admission: EM | Admit: 2022-05-12 | Discharge: 2022-05-12 | Disposition: A | Discharge status: Home / Self Care | Payer: Medicare Other | Attending: Emergency Medicine | Admitting: Emergency Medicine

## 2022-05-12 ENCOUNTER — Encounter: Payer: Self-pay | Admitting: Emergency Medicine

## 2022-05-12 DIAGNOSIS — B029 Zoster without complications: Secondary | ICD-10-CM | POA: Diagnosis not present

## 2022-05-12 MED ORDER — HYDROCODONE-ACETAMINOPHEN 5-325 MG PO TABS: 1.0000 | ORAL_TABLET | ORAL | 0 refills | Status: AC | PRN

## 2022-05-12 MED ORDER — VALACYCLOVIR HCL 1 G PO TABS: 1000.0000 mg | ORAL_TABLET | Freq: Three times a day (TID) | ORAL | 0 refills | Status: AC

## 2022-05-12 NOTE — Discharge Instructions (Signed)
Please begin valacyclovir 1 tablet 3 times daily for the next 7 days.  It will be best if you can pick up this prescription up as early as possible today so that you can get 3 doses in today.  For pain, please increase your dose of gabapentin by taking 3 doses every day.  The maximum dose of this medication is 800 mg per dose.  I recommend that she begin taking 400 mg 3 times daily.  If you do not have complete relief of your pain, continue to increase it by 100 mg per dose until you are satisfied that your pain is reasonably well relieved.  Also for pain, provided you with a prescription for hydrocodone 5 mg tablets that you can take up to every 4 hours for pain but because this medication often makes people sleepy, I really feel like it would be best for you to take this medication at nighttime only despite the instructions on the label.  For your cholesterol, please talk to your doctor about Repatha which is indicated for people who suffer from familial hypercholesterolemia like yourself.  For your lower legs, please put your new shoes on and take your multivitamins every day.  If you find that you have relief of your pain that you will know the underlying cause.  If you do not have relief of your pain with your new shoes and your multivitamins after a few weeks, please follow-up with your podiatrist.  Thank you for visiting urgent care today.  I appreciate the opportunity to precipitate your care.

## 2022-05-12 NOTE — ED Triage Notes (Signed)
Pt here for pain to right side of head x 5 days; seen at ED for same

## 2022-05-12 NOTE — ED Provider Notes (Signed)
[name]    CSN: [id number] Arrival date & time: 05/12/22  0813    HISTORY   Chief Complaint  Patient presents with   Headache   HPI Alice [name] is a 71 y.o. female. Patient complains of pain on the right side of her head for the past 5 days.  Patient states she was seen in the emergency room for the same issue 2 days ago.  Patient states the pain is a burning sensation and the location is in her scalp.  Patient is able to point to location of the initiation of pain.  Patient states she is never had pain like this before.  Patient endorses headaches in the past but this does not feel like a headache.  Patient denies vision changes but believes that she feels a rash in her scalp, states unable to visualize the rash herself because it is located to the back on the right.  Patient reports a history of chickenpox as a child.  The history is provided by the patient.   Past Medical History:  Diagnosis Date   Anorexia    HISTORY OF    Anxiety    Cancer ([place])    basal cell carcinoma per right side under arm    DDD (degenerative disc disease), lumbar    Depression    GERD (gastroesophageal reflux disease)    H/O hiatal hernia    History of stomach ulcers    Hyperlipidemia    Kidney stone    Sciatica    getting prednisone inj/epidural injections   Tinnitus    Patient Active Problem List   Diagnosis Date Noted   Idiopathic peripheral neuropathy 11/18/2020   Pain in left foot 07/14/2019   Closed fracture of fifth metatarsal bone 07/08/2019   Menopausal syndrome 04/04/2018   Greater trochanteric bursitis of left hip 11/07/2015   Trochanteric bursitis of left hip 11/07/2015   Hydrosalpinx 09/19/2015   Spinal stenosis 06/09/2015   Esophageal reflux 07/23/2014   Dysphagia 07/23/2014   Musculoskeletal pain 09/22/2012   Special screening for malignant neoplasms, colon 09/22/2012   Atypical hyperplasia of breast 08/11/2012   IRRITABLE BOWEL SYNDROME 03/21/2009    Constipation 03/02/2009   ABDOMINAL BLOATING 03/02/2009   ABDOMINAL PAIN-EPIGASTRIC 03/02/2009   ABDOMINAL PAIN-MULTIPLE SITES 03/02/2009   COLONIC POLYPS 03/01/2009   DM 03/01/2009   HYPERCHOLESTEROLEMIA 03/01/2009   ANXIETY DEPRESSION 03/01/2009   HYPERTENSION 03/01/2009   ESOPHAGITIS 03/01/2009   GASTRITIS, CHRONIC 03/01/2009   NEPHROLITHIASIS 03/01/2009   Past Surgical History:  Procedure Laterality Date   APPENDECTOMY  1959   BACK SURGERY     with rods and screws placed in lumbar area    BREAST BIOPSY Right    benign cyst   COLONOSCOPY  2015   dilation of esophagus and polypectomy   CYST EXCISION Right    foot   CYSTOSCOPY KIDNEY W/ URETERAL GUIDE WIRE     ELBOW SURGERY Left 2003   EXCISION/RELEASE BURSA HIP Left 11/07/2015   Procedure: LEFT HIP BURSECTOMY WITH GLUTEAL TENDON REPAIR;  Surgeon: Gaynelle Arabian, MD;  Location: [facility];  Service: Orthopedics;  Laterality: Left;   EYE SURGERY Right 2012   right-pterigium   ROTATOR CUFF REPAIR Left 2003   TONSILLECTOMY  1976   UPPER GASTROINTESTINAL ENDOSCOPY  2015   OB History   No obstetric history on file.    Home Medications    Prior to Admission medications   Medication Sig Start Date End Date Taking? Authorizing Provider  chlordiazePOXIDE (LIBRIUM) 10 MG capsule Take 10 mg by mouth daily as needed (stomach pain).     [provider]  clonazePAM (KLONOPIN) 1 MG tablet Take 1 mg by mouth at bedtime.    [provider]  clonazePAM (KLONOPIN) 2 MG tablet Take 2 mg by mouth at bedtime as needed.    [provider]  doxycycline (ORACEA) 40 MG capsule Take 40 mg by mouth See admin instructions. Take 1 capsule (40 mg) by mouth daily for 3 nights for rosacea breakouts    [provider]  Estradiol-Norethindrone Acet 0.5-0.1 MG tablet Activella 0.5 mg-0.1 mg tablet  Take 1 tablet every day by oral route for 90 days. 08/08/12   [provider]  fluconazole (DIFLUCAN) 150 MG tablet  Take by mouth. 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  03/16/15   [provider]  gabapentin (NEURONTIN) 100 MG capsule Take 100 mg by mouth at bedtime. 10/05/15   [provider]  HYDROcodone-acetaminophen (NORCO/VICODIN) 5-325 MG tablet Take 1 tablet by mouth every 4 (four) hours as needed for up to 5 days for severe pain. 05/12/22 05/17/22 Yes Lynden Oxford Scales, PA-C  meclizine (ANTIVERT) 12.5 MG tablet Take 12.5 mg by mouth as needed.    [provider]  meloxicam (MOBIC) 7.5 MG tablet Take 1 tablet (7.5 mg total) by mouth daily. 03/03/21   Jaynee Eagles, PA-C  metroNIDAZOLE (METROCREAM) 0.75 % cream Apply 1 application topically See admin instructions. Applies twice a day during rosacea breakouts 12/02/14   [provider]  mupirocin ointment (BACTROBAN) 2 % Apply 1 application topically 2 (two) times daily. Apply with q-tip to nasal opening twice day for 3-5 days. 11/24/19   Ok Edwards, PA-C  neomycin-polymyxin-hydrocortisone (CORTISPORIN) OTIC solution Apply 1-2 drops to toe after soaking twice a day 02/05/19   Wallene Huh, DPM  nystatin (MYCOSTATIN) 100000 UNIT/ML suspension Take 5 mLs (500,000 Units total) by mouth 4 (four) times daily. 11/04/20   Scot Jun, FNP  Omega-3 Fatty Acids (FISH OIL PO) Fish Oil    [provider]  pantoprazole (PROTONIX) 40 MG tablet Take 1 tablet (40 mg total) by mouth daily. 08/31/14   Inda Castle, MD  phenazopyridine (PYRIDIUM) 200 MG tablet Take 1 tablet (200 mg total) by mouth 3 (three) times daily as needed for pain. 01/31/21   Jaynee Eagles, PA-C  Polyethyl Glycol-Propyl Glycol (SYSTANE) 0.4-0.3 % SOLN Apply 1 drop to eye 4 (four) times daily as needed. 06/09/19   Melynda Ripple, MD  sertraline (ZOLOFT) 50 MG tablet Take 50 mg by mouth at bedtime. Pt stated, "I only take a 1/2 tablet" 03/03/15   [provider]  sodium chloride (OCEAN) 0.65 % SOLN nasal spray Place 2 sprays into both nostrils as needed for congestion. 11/24/19   Tasia Catchings, Amy V, PA-C  tiZANidine (ZANAFLEX) 4 MG tablet Take 1 tablet (4 mg total) by mouth at bedtime. 03/03/21   Jaynee Eagles, PA-C  triamcinolone ointment (KENALOG) 0.5 % Apply 1 application topically 2 (two) times daily. 08/14/19   Hall-Potvin, [place], PA-C  trimethoprim-polymyxin b (POLYTRIM) ophthalmic solution Place 1 drop into the left eye every 4 (four) hours. 05/31/20   Hall-Potvin, [place], PA-C  valACYclovir (VALTREX) 1000 MG tablet Take 1 tablet (1,000 mg total) by mouth 3 (three) times daily for 7 days. 05/12/22 05/19/22 Yes Lynden Oxford Scales, PA-C  Family History Family History  Problem Relation Age of Onset   Breast cancer Mother    Hyperlipidemia Mother        entire family   Hypertension Mother    Aneurysm Father        AAA   Stroke Father    Hypertension Sister    Breast cancer Paternal Grandmother    Hypertension Brother    Social History Social History   Tobacco Use   Smoking status: Never   Smokeless tobacco: Never  Vaping Use   Vaping Use: Never used  Substance Use Topics   Alcohol use: No   Drug use: No   Allergies   Bactrim [sulfamethoxazole-trimethoprim], Penicillins, Propoxyphene hcl, Statins, and Sulfa antibiotics  Review of Systems Review of Systems Pertinent findings noted in history of present illness.   Physical Exam Triage Vital Signs ED Triage Vitals  Enc Vitals Group     BP 09/29/21 0827 (!) 147/82     Pulse Rate 09/29/21 0827 72     Resp 09/29/21 0827 18     Temp 09/29/21 0827 98.3 F (36.8 C)     Temp Source 09/29/21 0827 Oral     SpO2 09/29/21 0827 98 %     Weight --      Height --      Head Circumference --      Peak Flow --      Pain Score 09/29/21 0826 5     Pain Loc --      Pain Edu? --      Excl. in [place]? --   No data found.  Updated Vital Signs BP (!) 120/55 (BP Location: Left Arm)    Pulse 67   Temp 97.9 F (36.6 C) (Oral)   Resp 18   SpO2 96%   Physical Exam Vitals and nursing note reviewed.  Constitutional:      General: She is not in acute distress.    Appearance: Normal appearance. She is not ill-appearing.  HENT:     Head: Normocephalic and atraumatic.  Eyes:     General: Lids are normal.        Right eye: No discharge.        Left eye: No discharge.     Extraocular Movements: Extraocular movements intact.     Conjunctiva/sclera: Conjunctivae normal.     Right eye: Right conjunctiva is not injected.     Left eye: Left conjunctiva is not injected.  Neck:     Trachea: Trachea and phonation normal.  Cardiovascular:     Rate and Rhythm: Normal rate and regular rhythm.     Pulses: Normal pulses.     Heart sounds: Normal heart sounds. No murmur heard.    No friction rub. No gallop.  Pulmonary:     Effort: Pulmonary effort is normal. No accessory muscle usage, prolonged expiration or respiratory distress.     Breath sounds: Normal breath sounds. No stridor, decreased air movement or transmitted upper airway sounds. No decreased breath sounds, wheezing, rhonchi or rales.  Chest:     Chest wall: No tenderness.  Musculoskeletal:        General: Normal range of motion.     Cervical back: Normal range of motion and neck supple. Normal range of motion.  Lymphadenopathy:     Cervical: No cervical adenopathy.  Skin:    General: Skin is warm and dry.     Findings: Rash (Vesicular lesions on erythematous base in a dermatomal pattern on right  posterior side of scalp.) present. No erythema.  Neurological:     General: No focal deficit present.     Mental Status: She is alert and oriented to person, place, and time.  Psychiatric:        Mood and Affect: Mood normal.        Behavior: Behavior normal.     Visual Acuity Right Eye Distance:   Left Eye Distance:   Bilateral Distance:    Right Eye Near:   Left Eye Near:    Bilateral Near:     UC Couse /  Diagnostics / Procedures:    EKG  Radiology  Procedures Procedures (including critical care time)  UC Diagnoses / Final Clinical Impressions(s)   I have reviewed the triage vital signs and the nursing notes.  Pertinent labs & imaging results that were available during my care of the patient were reviewed by me and considered in my medical decision making (see chart for details).    Final diagnoses:  Herpes zoster without complication   Patient advised that she must begin valacyclovir today due to being inside the 5-day window to begin this treatment.  Patient advised that because she is already taking gabapentin I recommend that she increase her dose for better pain relief.  Patient states she is unable to sleep at night secondary to the pain in her head regardless of whether she is lying on her right side or right side, patient advised that I am agreeable to providing her with some hydrocodone for nighttime use only so that she can get some sleep.  PDMP reviewed.  Patient advised that she CAN NOT hydrocodone and clonazepam at the same time.  Patient states she does not really take clonazepam that much anyway because she does not feel like it helps anymore.  Return precautions advised.  ED Prescriptions     Medication Sig Dispense Auth. Provider   valACYclovir (VALTREX) 1000 MG tablet Take 1 tablet (1,000 mg total) by mouth 3 (three) times daily for 7 days. 21 tablet Lynden Oxford Scales, PA-C   HYDROcodone-acetaminophen (NORCO/VICODIN) 5-325 MG tablet Take 1 tablet by mouth every 4 (four) hours as needed for up to 5 days for severe pain. 20 tablet Lynden Oxford Scales, PA-C      I have reviewed the PDMP during this encounter.  Pending results:  Labs Reviewed - No data to display  Medications Ordered in UC: Medications - No data to display  Disposition Upon Discharge:  Condition: stable for discharge home Home: take medications as prescribed; routine discharge instructions as  discussed; follow up as advised.  Patient presented with an acute illness with associated systemic symptoms and significant discomfort requiring urgent management. In my opinion, this is a condition that a prudent lay person (someone who possesses an average knowledge of health and medicine) may potentially expect to result in complications if not addressed urgently such as respiratory distress, impairment of bodily function or dysfunction of bodily organs.   Routine symptom specific, illness specific and/or disease specific instructions were discussed with the patient and/or caregiver at length.   As such, the patient has been evaluated and assessed, work-up was performed and treatment was provided in alignment with urgent care protocols and evidence based medicine.  Patient/parent/caregiver has been advised that the patient may require follow up for further testing and treatment if the symptoms continue in spite of treatment, as clinically indicated and appropriate.  Patient/parent/caregiver has been advised to return to the [facility] or PCP if no  better; to PCP or the Emergency Department if new signs and symptoms develop, or if the current signs or symptoms continue to change or worsen for further workup, evaluation and treatment as clinically indicated and appropriate  The patient will follow up with their current PCP if and as advised. If the patient does not currently have a PCP we will assist them in obtaining one.   The patient may need specialty follow up if the symptoms continue, in spite of conservative treatment and management, for further workup, evaluation, consultation and treatment as clinically indicated and appropriate.   Patient/parent/caregiver verbalized understanding and agreement of plan as discussed.  All questions were addressed during visit.  Please see discharge instructions below for further details of plan.  Discharge Instructions:   Discharge Instructions      Please  begin valacyclovir 1 tablet 3 times daily for the next 7 days.  It will be best if you can pick up this prescription up as early as possible today so that you can get 3 doses in today.  For pain, please increase your dose of gabapentin by taking 3 doses every day.  The maximum dose of this medication is 800 mg per dose.  I recommend that she begin taking 400 mg 3 times daily.  If you do not have complete relief of your pain, continue to increase it by 100 mg per dose until you are satisfied that your pain is reasonably well relieved.  Also for pain, provided you with a prescription for hydrocodone 5 mg tablets that you can take up to every 4 hours for pain but because this medication often makes people sleepy, I really feel like it would be best for you to take this medication at nighttime only despite the instructions on the label.  For your cholesterol, please talk to your doctor about Repatha which is indicated for people who suffer from familial hypercholesterolemia like yourself.  For your lower legs, please put your new shoes on and take your multivitamins every day.  If you find that you have relief of your pain that you will know the underlying cause.  If you do not have relief of your pain with your new shoes and your multivitamins after a few weeks, please follow-up with your podiatrist.  Thank you for visiting urgent care today.  I appreciate the opportunity to precipitate your care.    This office note has been dictated using Museum/gallery curator.  Unfortunately, and despite my best efforts, this method of dictation can sometimes lead to occasional typographical or grammatical errors.  I apologize in advance if this occurs.     Lynden Oxford Scales, [place] 05/13/22 [phone]

## 2022-05-16 DIAGNOSIS — M533 Sacrococcygeal disorders, not elsewhere classified: Secondary | ICD-10-CM | POA: Diagnosis not present

## 2022-05-23 ENCOUNTER — Ambulatory Visit
Admission: RE | Admit: 2022-05-23 | Discharge: 2022-05-23 | Disposition: A | Discharge status: Home / Self Care | Payer: Medicare Other | Source: Ambulatory Visit | Attending: Internal Medicine | Admitting: Internal Medicine

## 2022-05-23 ENCOUNTER — Other Ambulatory Visit: Payer: Self-pay

## 2022-05-23 VITALS — BP 121/80 | HR 83 | Temp 98.0°F | Resp 18

## 2022-05-23 DIAGNOSIS — B029 Zoster without complications: Secondary | ICD-10-CM | POA: Diagnosis not present

## 2022-05-23 MED ORDER — VALACYCLOVIR HCL 1 G PO TABS: 1000.0000 mg | ORAL_TABLET | Freq: Three times a day (TID) | ORAL | 0 refills | Status: AC

## 2022-05-23 NOTE — ED Provider Notes (Signed)
[name]    CSN: [id number] Arrival date & time: 05/23/22  1100      History   Chief Complaint Chief Complaint  Patient presents with   Rash    Shingles on scalp. Visited a week ago. - Entered by patient   Appointment    11:00    HPI Alice [name] is a 71 y.o. female.   Patient presents with itchiness, pain, numbness, tingling to right side of scalp that started a few weeks prior.  Patient was seen on 05/12/2022 and was diagnosed with shingles.  She was prescribed Valtrex with some improvement.  She states that she wants to be sure that she has shingles today.  Denies any associated fever, bodies, chills.  She was seen in the ED prior to urgent care visit and had a CT of the head due to associated headache that was unremarkable.  Denies any associated blurry vision, nausea, vomiting, dizziness.  Patient does report that she had basal cell carcinoma removed from right lateral neck recently by dermatologist as well so is not sure if this is attributed.   Rash   Past Medical History:  Diagnosis Date   Anorexia    HISTORY OF    Anxiety    Cancer ([place])    basal cell carcinoma per right side under arm    DDD (degenerative disc disease), lumbar    Depression    GERD (gastroesophageal reflux disease)    H/O hiatal hernia    History of stomach ulcers    Hyperlipidemia    Kidney stone    Sciatica    getting prednisone inj/epidural injections   Tinnitus     Patient Active Problem List   Diagnosis Date Noted   Idiopathic peripheral neuropathy 11/18/2020   Pain in left foot 07/14/2019   Closed fracture of fifth metatarsal bone 07/08/2019   Menopausal syndrome 04/04/2018   Greater trochanteric bursitis of left hip 11/07/2015   Trochanteric bursitis of left hip 11/07/2015   Hydrosalpinx 09/19/2015   Spinal stenosis 06/09/2015   Esophageal reflux 07/23/2014   Dysphagia 07/23/2014   Musculoskeletal pain 09/22/2012   Special screening for malignant  neoplasms, colon 09/22/2012   Atypical hyperplasia of breast 08/11/2012   IRRITABLE BOWEL SYNDROME 03/21/2009   Constipation 03/02/2009   ABDOMINAL BLOATING 03/02/2009   ABDOMINAL PAIN-EPIGASTRIC 03/02/2009   ABDOMINAL PAIN-MULTIPLE SITES 03/02/2009   COLONIC POLYPS 03/01/2009   DM 03/01/2009   HYPERCHOLESTEROLEMIA 03/01/2009   ANXIETY DEPRESSION 03/01/2009   HYPERTENSION 03/01/2009   ESOPHAGITIS 03/01/2009   GASTRITIS, CHRONIC 03/01/2009   NEPHROLITHIASIS 03/01/2009    Past Surgical History:  Procedure Laterality Date   APPENDECTOMY  1959   BACK SURGERY     with rods and screws placed in lumbar area    BREAST BIOPSY Right    benign cyst   COLONOSCOPY  2015   dilation of esophagus and polypectomy   CYST EXCISION Right    foot   CYSTOSCOPY KIDNEY W/ URETERAL GUIDE WIRE     ELBOW SURGERY Left 2003   EXCISION/RELEASE BURSA HIP Left 11/07/2015   Procedure: LEFT HIP BURSECTOMY WITH GLUTEAL TENDON REPAIR;  Surgeon: Gaynelle Arabian, MD;  Location: [facility];  Service: Orthopedics;  Laterality: Left;   EYE SURGERY Right 2012   right-pterigium   ROTATOR CUFF REPAIR Left 2003   TONSILLECTOMY  1976   UPPER GASTROINTESTINAL ENDOSCOPY  2015    OB History   No obstetric history on file.      Home Medications  Prior to Admission medications   Medication Sig Start Date End Date Taking? Authorizing Provider  valACYclovir (VALTREX) 1000 MG tablet Take 1 tablet (1,000 mg total) by mouth 3 (three) times daily for 7 days. 05/23/22 05/30/22 Yes Jash Wahlen, Michele Rockers, FNP  chlordiazePOXIDE (LIBRIUM) 10 MG capsule Take 10 mg by mouth daily as needed (stomach pain).     [provider]  clonazePAM (KLONOPIN) 1 MG tablet Take 1 mg by mouth at bedtime.    [provider]  clonazePAM (KLONOPIN) 2 MG tablet Take 2 mg by mouth at bedtime as needed.    [provider]  doxycycline (ORACEA) 40 MG capsule Take 40 mg by mouth See admin instructions. Take 1 capsule (40 mg) by mouth  daily for 3 nights for rosacea breakouts    [provider]  Estradiol-Norethindrone Acet 0.5-0.1 MG tablet Activella 0.5 mg-0.1 mg tablet  Take 1 tablet every day by oral route for 90 days. 08/08/12   [provider]  fluconazole (DIFLUCAN) 150 MG tablet Take by mouth. 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  03/16/15   [provider]  gabapentin (NEURONTIN) 100 MG capsule Take 100 mg by mouth at bedtime. 10/05/15   [provider]  meclizine (ANTIVERT) 12.5 MG tablet Take 12.5 mg by mouth as needed.    [provider]  meloxicam (MOBIC) 7.5 MG tablet Take 1 tablet (7.5 mg total) by mouth daily. Patient not taking: Reported on 05/23/2022 03/03/21   Jaynee Eagles, PA-C  metroNIDAZOLE (METROCREAM) 0.75 % cream Apply 1 application topically See admin instructions. Applies twice a day during rosacea breakouts 12/02/14   [provider]  mupirocin ointment (BACTROBAN) 2 % Apply 1 application topically 2 (two) times daily. Apply with q-tip to nasal opening twice day for 3-5 days. 11/24/19   Ok Edwards, PA-C  neomycin-polymyxin-hydrocortisone (CORTISPORIN) OTIC solution Apply 1-2 drops to toe after soaking twice a day Patient not taking: Reported on 05/23/2022 02/05/19   Wallene Huh, DPM  nystatin (MYCOSTATIN) 100000 UNIT/ML suspension Take 5 mLs (500,000 Units total) by mouth 4 (four) times daily. 11/04/20   Scot Jun, FNP  Omega-3 Fatty Acids (FISH OIL PO) Fish Oil    [provider]  pantoprazole (PROTONIX) 40 MG tablet Take 1 tablet (40 mg total) by mouth daily. 08/31/14   Inda Castle, MD  phenazopyridine (PYRIDIUM) 200 MG tablet Take 1 tablet (200 mg total) by mouth 3 (three) times daily as needed for pain. 01/31/21   Jaynee Eagles, PA-C  Polyethyl Glycol-Propyl Glycol (SYSTANE) 0.4-0.3 % SOLN Apply  1 drop to eye 4 (four) times daily as needed. 06/09/19   Melynda Ripple, MD  sertraline (ZOLOFT) 50 MG tablet Take 50 mg by mouth at bedtime. Pt stated, "I only take a 1/2 tablet" 03/03/15   [provider]  sodium chloride (OCEAN) 0.65 % SOLN nasal spray Place 2 sprays into both nostrils as needed for congestion. 11/24/19   Tasia Catchings, Amy V, PA-C  tiZANidine (ZANAFLEX) 4 MG tablet Take 1 tablet (4 mg total) by mouth at bedtime. Patient not taking: Reported on 05/23/2022 03/03/21   Jaynee Eagles, PA-C  triamcinolone ointment (KENALOG) 0.5 % Apply 1 application topically 2 (two) times daily. 08/14/19   Hall-Potvin, [place], PA-C  trimethoprim-polymyxin b (POLYTRIM) ophthalmic solution Place 1 drop into the left eye every 4 (four) hours. Patient  not taking: Reported on 05/23/2022 05/31/20   Hall-Potvin, [place], PA-C    Family History Family History  Problem Relation Age of Onset   Breast cancer Mother    Hyperlipidemia Mother        entire family   Hypertension Mother    Aneurysm Father        AAA   Stroke Father    Hypertension Sister    Breast cancer Paternal Grandmother    Hypertension Brother     Social History Social History   Tobacco Use   Smoking status: Never   Smokeless tobacco: Never  Vaping Use   Vaping Use: Never used  Substance Use Topics   Alcohol use: No   Drug use: No     Allergies   Bactrim [sulfamethoxazole-trimethoprim], Penicillins, Propoxyphene hcl, Statins, and Sulfa antibiotics   Review of Systems Review of Systems Per HPI  Physical Exam Triage Vital Signs ED Triage Vitals  Enc Vitals Group     BP 05/23/22 1123 121/80     Pulse Rate 05/23/22 1123 83     Resp 05/23/22 1123 18     Temp 05/23/22 1123 98 F (36.7 C)     Temp Source 05/23/22 1123 Oral     SpO2 05/23/22 1123 96 %     Weight --      Height --      Head Circumference --      Peak Flow --      Pain Score 05/23/22 1118 8     Pain Loc --      Pain Edu? --      Excl. in [place]? --     No data found.  Updated Vital Signs BP 121/80 (BP Location: Left Arm)   Pulse 83   Temp 98 F (36.7 C) (Oral)   Resp 18   SpO2 96%   Visual Acuity Right Eye Distance:   Left Eye Distance:   Bilateral Distance:    Right Eye Near:   Left Eye Near:    Bilateral Near:     Physical Exam Constitutional:      General: She is not in acute distress.    Appearance: Normal appearance. She is not toxic-appearing or diaphoretic.  HENT:     Head: Normocephalic and atraumatic.     Comments: No obvious rash but does have some mild erythema located to right top of scalp.  No obvious sores or vesicular lesions.  Patient reports that sensation is on the right top portion of the scalp and radiates down right neck. Eyes:     Extraocular Movements: Extraocular movements intact.     Conjunctiva/sclera: Conjunctivae normal.  Pulmonary:     Effort: Pulmonary effort is normal.  Neurological:     General: No focal deficit present.     Mental Status: She is alert and oriented to person, place, and time. Mental status is at baseline.     Cranial Nerves: Cranial nerves 2-12 are intact.     Sensory: Sensation is intact.     Motor: Motor function is intact.     Coordination: Coordination is intact.     Gait: Gait is intact.  Psychiatric:        Mood and Affect: Mood normal.        Behavior: Behavior normal.        Thought Content: Thought content normal.        Judgment: Judgment normal.      UC Treatments / Results  Labs (all  labs ordered are listed, but only abnormal results are displayed) Labs Reviewed - No data to display  EKG   Radiology No results found.  Procedures Procedures (including critical care time)  Medications Ordered in UC Medications - No data to display  Initial Impression / Assessment and Plan / UC Course  I have reviewed the triage vital signs and the nursing notes.  Pertinent labs & imaging results that were available during my care of the patient were  reviewed by me and considered in my medical decision making (see chart for details).     I do think it is reasonable that patient has herpes zoster given associated symptoms.  Although, there is no obvious rash that is definitive for this.  Patient has established dermatologist so advised patient to follow-up with dermatology today to schedule appointment for further evaluation and management to confirm diagnosis as well as to ensure that there were no complications related to result basal cell carcinoma removal.  Will prescribe another week course of Valtrex given the patient saw some improvement with previous symptoms.  Patient reports history of CKD but most recent creatinine that is able to be viewed is normal so do think that normal dose of Valtrex is okay.  Patient was given strict return and ER precautions.  Patient verbalized understanding and was agreeable with plan. Final Clinical Impressions(s) / UC Diagnoses   Final diagnoses:  Herpes zoster without complication     Discharge Instructions      Please follow-up with your dermatologist today for further evaluation and management.     ED Prescriptions     Medication Sig Dispense Auth. Provider   valACYclovir (VALTREX) 1000 MG tablet Take 1 tablet (1,000 mg total) by mouth 3 (three) times daily for 7 days. 21 tablet [place], Michele Rockers, [place]      PDMP not reviewed this encounter.   Teodora Medici, [place] 05/23/22 1256

## 2022-05-23 NOTE — Discharge Instructions (Addendum)
Please follow-up with your dermatologist today for further evaluation and management.

## 2022-05-23 NOTE — ED Triage Notes (Signed)
Patient seen 05/12/2022 and told she has shingles.  Patient wants to make sure she has shingles.  Patient has itching, some times sharp pain.

## 2022-06-28 ENCOUNTER — Ambulatory Visit
Admission: RE | Admit: 2022-06-28 | Discharge: 2022-06-28 | Disposition: A | Discharge status: Home / Self Care | Payer: Medicare Other | Source: Ambulatory Visit | Attending: Physician Assistant | Admitting: Physician Assistant

## 2022-06-28 VITALS — BP 106/60 | HR 69 | Temp 97.6°F | Resp 18

## 2022-06-28 DIAGNOSIS — N3000 Acute cystitis without hematuria: Secondary | ICD-10-CM | POA: Diagnosis not present

## 2022-06-28 LAB — POCT URINALYSIS DIP (MANUAL ENTRY)
Bilirubin, UA: NEGATIVE
Glucose, UA: NEGATIVE mg/dL
Ketones, POC UA: NEGATIVE mg/dL
Nitrite, UA: NEGATIVE
Protein Ur, POC: NEGATIVE mg/dL
Spec Grav, UA: 1.025 (ref 1.010–1.025)
Urobilinogen, UA: 0.2 E.U./dL
pH, UA: 5.5 (ref 5.0–8.0)

## 2022-06-28 MED ORDER — CIPROFLOXACIN HCL 500 MG PO TABS: 500.0000 mg | ORAL_TABLET | Freq: Two times a day (BID) | ORAL | 0 refills | Status: DC

## 2022-06-28 MED ORDER — FLUCONAZOLE 150 MG PO TABS: 150.0000 mg | ORAL_TABLET | Freq: Every day | ORAL | 0 refills | Status: DC

## 2022-06-28 NOTE — ED Triage Notes (Addendum)
Pt presents with new onset low back pain that radiates on the L side. Pt reports difficulty urinating and defecating due to pain. Pt reports she has a past medical history of lumbar surgery. Does not recall any injury. Pt reports she has been working on cleaning her mothers house out so she is not sure if this is injury/ hardware or kidney infection does report ckd stage 3.   Pt expresses frustration with feeling like doctors are not listening to her, multiple questions regarding medications and pain management, encouraged patient to speak with the pa about this as well as bring in some of these concerns to her pcp as we cannot address the vaccination concerns.

## 2022-06-28 NOTE — ED Provider Notes (Addendum)
[name]    CSN: [id number] Arrival date & time: 06/28/22  1052      History   Chief Complaint Chief Complaint  Patient presents with   Back Pain    Lower back and left side waist and buttocks. - Entered by patient    HPI Alice [name] is a 71 y.o. female.   Patient here today for evaluation of lower back pain that sometime radiates to left side. She reports symptoms are not the same as prior sciatica. She has had UTI in the past present this way and is not sure if this is causing symptoms. Cipro is typically effective for same per patient. She denies any nausea or vomiting. She has not had fever. She does not report treatment for her symptoms.   The history is provided by the patient.  Back Pain Associated symptoms: no abdominal pain and no fever     Past Medical History:  Diagnosis Date   Anorexia    HISTORY OF    Anxiety    Cancer ([place])    basal cell carcinoma per right side under arm    DDD (degenerative disc disease), lumbar    Depression    GERD (gastroesophageal reflux disease)    H/O hiatal hernia    History of stomach ulcers    Hyperlipidemia    Kidney stone    Sciatica    getting prednisone inj/epidural injections   Tinnitus     Patient Active Problem List   Diagnosis Date Noted   Idiopathic peripheral neuropathy 11/18/2020   Pain in left foot 07/14/2019   Closed fracture of fifth metatarsal bone 07/08/2019   Menopausal syndrome 04/04/2018   Greater trochanteric bursitis of left hip 11/07/2015   Trochanteric bursitis of left hip 11/07/2015   Hydrosalpinx 09/19/2015   Spinal stenosis 06/09/2015   Esophageal reflux 07/23/2014   Dysphagia 07/23/2014   Musculoskeletal pain 09/22/2012   Special screening for malignant neoplasms, colon 09/22/2012   Atypical hyperplasia of breast 08/11/2012   IRRITABLE BOWEL SYNDROME 03/21/2009   Constipation 03/02/2009   ABDOMINAL BLOATING 03/02/2009   ABDOMINAL PAIN-EPIGASTRIC 03/02/2009    ABDOMINAL PAIN-MULTIPLE SITES 03/02/2009   COLONIC POLYPS 03/01/2009   DM 03/01/2009   HYPERCHOLESTEROLEMIA 03/01/2009   ANXIETY DEPRESSION 03/01/2009   HYPERTENSION 03/01/2009   ESOPHAGITIS 03/01/2009   GASTRITIS, CHRONIC 03/01/2009   NEPHROLITHIASIS 03/01/2009    Past Surgical History:  Procedure Laterality Date   APPENDECTOMY  1959   BACK SURGERY     with rods and screws placed in lumbar area    BREAST BIOPSY Right    benign cyst   COLONOSCOPY  2015   dilation of esophagus and polypectomy   CYST EXCISION Right    foot   CYSTOSCOPY KIDNEY W/ URETERAL GUIDE WIRE     ELBOW SURGERY Left 2003   EXCISION/RELEASE BURSA HIP Left 11/07/2015   Procedure: LEFT HIP BURSECTOMY WITH GLUTEAL TENDON REPAIR;  Surgeon: Gaynelle Arabian, MD;  Location: [facility];  Service: Orthopedics;  Laterality: Left;   EYE SURGERY Right 2012   right-pterigium   ROTATOR CUFF REPAIR Left 2003   TONSILLECTOMY  1976   UPPER GASTROINTESTINAL ENDOSCOPY  2015    OB History   No obstetric history on file.      Home Medications    Prior to Admission medications   Medication Sig Start Date End Date Taking? Authorizing Provider  ciprofloxacin (CIPRO) 500 MG tablet Take 1 tablet (500 mg total) by mouth every 12 (twelve) hours.  06/28/22  Yes Francene Finders, PA-C  fluconazole (DIFLUCAN) 150 MG tablet Take 1 tablet (150 mg total) by mouth daily. 06/28/22  Yes Francene Finders, PA-C  chlordiazePOXIDE (LIBRIUM) 10 MG capsule Take 10 mg by mouth daily as needed (stomach pain).     [provider]  clonazePAM (KLONOPIN) 1 MG tablet Take 1 mg by mouth at bedtime.    [provider]  clonazePAM (KLONOPIN) 2 MG tablet Take 2 mg by mouth at bedtime as needed.    [provider]  doxycycline (ORACEA) 40 MG capsule Take 40 mg by mouth See admin instructions. Take 1 capsule (40 mg) by mouth daily for 3 nights for rosacea breakouts    [provider]  Estradiol-Norethindrone Acet 0.5-0.1 MG  tablet Activella 0.5 mg-0.1 mg tablet  Take 1 tablet every day by oral route for 90 days. 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  03/16/15   [provider]  gabapentin (NEURONTIN) 100 MG capsule Take 100 mg by mouth at bedtime. 10/05/15   [provider]  meclizine (ANTIVERT) 12.5 MG tablet Take 12.5 mg by mouth as needed.    [provider]  meloxicam (MOBIC) 7.5 MG tablet Take 1 tablet (7.5 mg total) by mouth daily. Patient not taking: Reported on 05/23/2022 03/03/21   Jaynee Eagles, PA-C  metroNIDAZOLE (METROCREAM) 0.75 % cream Apply 1 application topically See admin instructions. Applies twice a day during rosacea breakouts 12/02/14   [provider]  mupirocin ointment (BACTROBAN) 2 % Apply 1 application topically 2 (two) times daily. Apply with q-tip to nasal opening twice day for 3-5 days. 11/24/19   Ok Edwards, PA-C  neomycin-polymyxin-hydrocortisone (CORTISPORIN) OTIC solution Apply 1-2 drops to toe after soaking twice a day Patient not taking: Reported on 05/23/2022 02/05/19   Wallene Huh, DPM  nystatin (MYCOSTATIN) 100000 UNIT/ML suspension Take 5 mLs (500,000 Units total) by mouth 4 (four) times daily. 11/04/20   Scot Jun, FNP  Omega-3 Fatty Acids (FISH OIL PO) Fish Oil    [provider]  pantoprazole (PROTONIX) 40 MG tablet Take 1 tablet (40 mg total) by mouth daily. 08/31/14   Inda Castle, MD  phenazopyridine (PYRIDIUM) 200 MG tablet Take 1 tablet (200 mg total) by mouth 3 (three) times daily as needed for pain. 01/31/21   Jaynee Eagles, PA-C  Polyethyl Glycol-Propyl Glycol (SYSTANE) 0.4-0.3 % SOLN Apply 1 drop to eye 4 (four) times daily as needed. 06/09/19   Melynda Ripple, MD  sertraline (ZOLOFT) 50 MG tablet Take 50 mg by mouth at bedtime. Pt stated, "I only take a 1/2 tablet" 03/03/15   [provider]  sodium chloride (OCEAN)  0.65 % SOLN nasal spray Place 2 sprays into both nostrils as needed for congestion. 11/24/19   Tasia Catchings, Amy V, PA-C  tiZANidine (ZANAFLEX) 4 MG tablet Take 1 tablet (4 mg total) by mouth at bedtime. Patient not taking: Reported on 05/23/2022 03/03/21   Jaynee Eagles, PA-C  triamcinolone ointment (KENALOG) 0.5 % Apply 1 application topically 2 (two) times daily. 08/14/19   Hall-Potvin, [place], PA-C  trimethoprim-polymyxin b (POLYTRIM) ophthalmic solution Place 1 drop into the left eye every 4 (four) hours. Patient not taking: Reported on 05/23/2022 05/31/20   Hall-Potvin, [place], PA-C    Family History Family History  Problem Relation Age of Onset   Breast cancer Mother    Hyperlipidemia Mother  entire family   Hypertension Mother    Aneurysm Father        AAA   Stroke Father    Hypertension Sister    Breast cancer Paternal Grandmother    Hypertension Brother     Social History Social History   Tobacco Use   Smoking status: Never   Smokeless tobacco: Never  Vaping Use   Vaping Use: Never used  Substance Use Topics   Alcohol use: No   Drug use: No     Allergies   Bactrim [sulfamethoxazole-trimethoprim], Penicillins, Propoxyphene hcl, Statins, and Sulfa antibiotics   Review of Systems Review of Systems  Constitutional:  Negative for chills and fever.  Eyes:  Negative for discharge and redness.  Gastrointestinal:  Negative for abdominal pain, nausea and vomiting.  Genitourinary:  Positive for flank pain.  Musculoskeletal:  Positive for back pain.     Physical Exam Triage Vital Signs ED Triage Vitals  Enc Vitals Group     BP      Pulse      Resp      Temp      Temp src      SpO2      Weight      Height      Head Circumference      Peak Flow      Pain Score      Pain Loc      Pain Edu?      Excl. in [place]?    No data found.  Updated Vital Signs BP 106/60   Pulse 69   Temp 97.6 F (36.4 C)   Resp 18   SpO2 96%      Physical Exam Vitals and nursing  note reviewed.  Constitutional:      General: She is not in acute distress.    Appearance: Normal appearance. She is not ill-appearing.  HENT:     Head: Normocephalic and atraumatic.  Eyes:     Conjunctiva/sclera: Conjunctivae normal.  Cardiovascular:     Rate and Rhythm: Normal rate.  Pulmonary:     Effort: Pulmonary effort is normal.  Musculoskeletal:     Comments: No midline spinal tenderness  Neurological:     Mental Status: She is alert.  Psychiatric:        Mood and Affect: Mood normal.        Behavior: Behavior normal.        Thought Content: Thought content normal.      UC Treatments / Results  Labs (all labs ordered are listed, but only abnormal results are displayed) Labs Reviewed  POCT URINALYSIS DIP (MANUAL ENTRY) - Abnormal; Notable for the following components:      Result Value   Clarity, UA cloudy (*)    Blood, UA small (*)    Leukocytes, UA Large (3+) (*)    All other components within normal limits  URINE CULTURE    EKG   Radiology No results found.  Procedures Procedures (including critical care time)  Medications Ordered in UC Medications - No data to display  Initial Impression / Assessment and Plan / UC Course  I have reviewed the triage vital signs and the nursing notes.  Pertinent labs & imaging results that were available during my care of the patient were reviewed by me and considered in my medical decision making (see chart for details).    UA with positive LE, will treat to cover UTI and urine culture ordered. Diflucan prescribed as patient  reports she typically will get yeast infections with antibiotic therapy. Recommended she keep upcoming appointments with PCP and specialist (2-3 weeks) and encouraged follow up at [facility] with any further concerns in the meantime. Patient expresses understanding.   Final Clinical Impressions(s) / UC Diagnoses   Final diagnoses:  Acute cystitis without hematuria   Discharge Instructions   None     ED Prescriptions     Medication Sig Dispense Auth. Provider   ciprofloxacin (CIPRO) 500 MG tablet Take 1 tablet (500 mg total) by mouth every 12 (twelve) hours. 10 tablet Ewell Poe F, PA-C   fluconazole (DIFLUCAN) 150 MG tablet Take 1 tablet (150 mg total) by mouth daily. 2 tablet Francene Finders, PA-C      PDMP not reviewed this encounter.   Francene Finders, PA-C 06/28/22 1240    Francene Finders, PA-C 06/28/22 1245

## 2022-06-30 LAB — URINE CULTURE: Culture: 50000 — AB

## 2022-07-12 DIAGNOSIS — K219 Gastro-esophageal reflux disease without esophagitis: Secondary | ICD-10-CM | POA: Diagnosis not present

## 2022-07-12 DIAGNOSIS — L0390 Cellulitis, unspecified: Secondary | ICD-10-CM | POA: Diagnosis not present

## 2022-07-12 DIAGNOSIS — N183 Chronic kidney disease, stage 3 unspecified: Secondary | ICD-10-CM | POA: Diagnosis not present

## 2022-07-12 DIAGNOSIS — N63 Unspecified lump in unspecified breast: Secondary | ICD-10-CM | POA: Diagnosis not present

## 2022-07-12 DIAGNOSIS — K589 Irritable bowel syndrome without diarrhea: Secondary | ICD-10-CM | POA: Diagnosis not present

## 2022-07-12 DIAGNOSIS — F5101 Primary insomnia: Secondary | ICD-10-CM | POA: Diagnosis not present

## 2022-07-12 DIAGNOSIS — M545 Low back pain, unspecified: Secondary | ICD-10-CM | POA: Diagnosis not present

## 2022-07-12 DIAGNOSIS — E7800 Pure hypercholesterolemia, unspecified: Secondary | ICD-10-CM | POA: Diagnosis not present

## 2022-07-17 ENCOUNTER — Other Ambulatory Visit: Payer: Self-pay | Admitting: Family Medicine

## 2022-07-17 DIAGNOSIS — M533 Sacrococcygeal disorders, not elsewhere classified: Secondary | ICD-10-CM | POA: Diagnosis not present

## 2022-07-17 DIAGNOSIS — N63 Unspecified lump in unspecified breast: Secondary | ICD-10-CM

## 2022-07-27 ENCOUNTER — Ambulatory Visit (INDEPENDENT_AMBULATORY_CARE_PROVIDER_SITE_OTHER): Payer: Medicare Other

## 2022-07-27 ENCOUNTER — Ambulatory Visit
Admission: RE | Admit: 2022-07-27 | Discharge: 2022-07-27 | Disposition: A | Discharge status: Home / Self Care | Payer: Medicare Other | Source: Ambulatory Visit | Attending: Internal Medicine | Admitting: Internal Medicine

## 2022-07-27 VITALS — BP 116/75 | HR 62 | Temp 98.0°F | Resp 16

## 2022-07-27 DIAGNOSIS — W19XXXA Unspecified fall, initial encounter: Secondary | ICD-10-CM | POA: Diagnosis not present

## 2022-07-27 DIAGNOSIS — S80211A Abrasion, right knee, initial encounter: Secondary | ICD-10-CM | POA: Diagnosis not present

## 2022-07-27 DIAGNOSIS — M25561 Pain in right knee: Secondary | ICD-10-CM

## 2022-07-27 DIAGNOSIS — S8001XA Contusion of right knee, initial encounter: Secondary | ICD-10-CM | POA: Diagnosis not present

## 2022-07-27 DIAGNOSIS — S8991XA Unspecified injury of right lower leg, initial encounter: Secondary | ICD-10-CM | POA: Diagnosis not present

## 2022-07-27 MED ORDER — MUPIROCIN 2 % EX OINT: 1.0000 | TOPICAL_OINTMENT | Freq: Two times a day (BID) | CUTANEOUS | 0 refills | Status: DC

## 2022-07-27 NOTE — ED Triage Notes (Signed)
Pt c/o falling 2 days ago injuring right knee on wooden floor. Reports fall injuring left knee was "not that long" ago. States cannot have antiinflammatory 2/2 kidney damage.

## 2022-07-27 NOTE — ED Provider Notes (Signed)
[name]    CSN: [id number] Arrival date & time: 07/27/22  1025      History   Chief Complaint Chief Complaint  Patient presents with   Knee Injury    [address] on my knee cap on wood floor. Swollen and purple. Check to see what damage. I've been icing it - Entered by patient    HPI Alice [name] is a 71 y.o. female.   Patient presents today with a 2-day history of right knee pain.  Reports that she was carrying boxes when she tripped over a step falling with the majority of her weight on her right knee.  She has previously fallen and injured her left knee but reports that this is at baseline and no worse off.  She is followed by orthopedics but has not seen them for this condition.  Pain is rated 5 on a 0-10 pain scale, described as aching, no aggravating relieving factors identified.  She denies any instability, popping, clicking.  Denies previous injury or surgery involving this knee.  She is able to bear weight unassisted.  She has not been taking any medication as she is unable to take NSAIDs due to chronic kidney disease.  She has been using ice which has provided improvement but not resolution of symptoms.    Past Medical History:  Diagnosis Date   Anorexia    HISTORY OF    Anxiety    Cancer ([place])    basal cell carcinoma per right side under arm    DDD (degenerative disc disease), lumbar    Depression    GERD (gastroesophageal reflux disease)    H/O hiatal hernia    History of stomach ulcers    Hyperlipidemia    Kidney stone    Sciatica    getting prednisone inj/epidural injections   Tinnitus     Patient Active Problem List   Diagnosis Date Noted   Idiopathic peripheral neuropathy 11/18/2020   Pain in left foot 07/14/2019   Closed fracture of fifth metatarsal bone 07/08/2019   Menopausal syndrome 04/04/2018   Greater trochanteric bursitis of left hip 11/07/2015   Trochanteric bursitis of left hip 11/07/2015   Hydrosalpinx 09/19/2015   Spinal  stenosis 06/09/2015   Esophageal reflux 07/23/2014   Dysphagia 07/23/2014   Musculoskeletal pain 09/22/2012   Special screening for malignant neoplasms, colon 09/22/2012   Atypical hyperplasia of breast 08/11/2012   IRRITABLE BOWEL SYNDROME 03/21/2009   Constipation 03/02/2009   ABDOMINAL BLOATING 03/02/2009   ABDOMINAL PAIN-EPIGASTRIC 03/02/2009   ABDOMINAL PAIN-MULTIPLE SITES 03/02/2009   COLONIC POLYPS 03/01/2009   DM 03/01/2009   HYPERCHOLESTEROLEMIA 03/01/2009   ANXIETY DEPRESSION 03/01/2009   HYPERTENSION 03/01/2009   ESOPHAGITIS 03/01/2009   GASTRITIS, CHRONIC 03/01/2009   NEPHROLITHIASIS 03/01/2009    Past Surgical History:  Procedure Laterality Date   APPENDECTOMY  1959   BACK SURGERY     with rods and screws placed in lumbar area    BREAST BIOPSY Right    benign cyst   COLONOSCOPY  2015   dilation of esophagus and polypectomy   CYST EXCISION Right    foot   CYSTOSCOPY KIDNEY W/ URETERAL GUIDE WIRE     ELBOW SURGERY Left 2003   EXCISION/RELEASE BURSA HIP Left 11/07/2015   Procedure: LEFT HIP BURSECTOMY WITH GLUTEAL TENDON REPAIR;  Surgeon: Gaynelle Arabian, MD;  Location: [facility];  Service: Orthopedics;  Laterality: Left;   EYE SURGERY Right 2012   right-pterigium   ROTATOR CUFF REPAIR Left 2003  TONSILLECTOMY  1976   UPPER GASTROINTESTINAL ENDOSCOPY  2015    OB History   No obstetric history on file.      Home Medications    Prior to Admission medications   Medication Sig Start Date End Date Taking? Authorizing Provider  mupirocin ointment (BACTROBAN) 2 % Apply 1 Application topically 2 (two) times daily. 07/27/22  Yes Osric Klopf K, PA-C  chlordiazePOXIDE (LIBRIUM) 10 MG capsule Take 10 mg by mouth daily as needed (stomach pain).     [provider]  ciprofloxacin (CIPRO) 500 MG tablet Take 1 tablet (500 mg total) by mouth every 12 (twelve) hours. 06/28/22   Francene Finders, PA-C  clonazePAM (KLONOPIN) 1 MG tablet Take 1 mg by mouth at bedtime.     [provider]  clonazePAM (KLONOPIN) 2 MG tablet Take 2 mg by mouth at bedtime as needed.    [provider]  doxycycline (ORACEA) 40 MG capsule Take 40 mg by mouth See admin instructions. Take 1 capsule (40 mg) by mouth daily for 3 nights for rosacea breakouts    [provider]  Estradiol-Norethindrone Acet 0.5-0.1 MG tablet Activella 0.5 mg-0.1 mg tablet  Take 1 tablet every day by oral route for 90 days. 08/08/12   [provider]  fluconazole (DIFLUCAN) 150 MG tablet Take 1 tablet (150 mg total) by mouth daily. 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  03/16/15   [provider]  gabapentin (NEURONTIN) 100 MG capsule Take 100 mg by mouth at bedtime. 10/05/15   [provider]  meclizine (ANTIVERT) 12.5 MG tablet Take 12.5 mg by mouth as needed.    [provider]  meloxicam (MOBIC) 7.5 MG tablet Take 1 tablet (7.5 mg total) by mouth daily. Patient not taking: Reported on 05/23/2022 03/03/21   Jaynee Eagles, PA-C  metroNIDAZOLE (METROCREAM) 0.75 % cream Apply 1 application topically See admin instructions. Applies twice a day during rosacea breakouts 12/02/14   [provider]  neomycin-polymyxin-hydrocortisone (CORTISPORIN) OTIC solution Apply 1-2 drops to toe after soaking twice a day Patient not taking: Reported on 05/23/2022 02/05/19   Wallene Huh, DPM  nystatin (MYCOSTATIN) 100000 UNIT/ML suspension Take 5 mLs (500,000 Units total) by mouth 4 (four) times daily. 11/04/20   Scot Jun, FNP  Omega-3 Fatty Acids (FISH OIL PO) Fish Oil    [provider]  pantoprazole (PROTONIX) 40 MG tablet Take 1 tablet (40 mg total) by mouth daily. 08/31/14   Inda Castle, MD  phenazopyridine (PYRIDIUM) 200 MG tablet Take 1 tablet (200 mg total) by mouth 3 (three) times daily as needed for pain. 01/31/21   Jaynee Eagles, PA-C   Polyethyl Glycol-Propyl Glycol (SYSTANE) 0.4-0.3 % SOLN Apply 1 drop to eye 4 (four) times daily as needed. 06/09/19   Melynda Ripple, MD  sertraline (ZOLOFT) 50 MG tablet Take 50 mg by mouth at bedtime. Pt stated, "I only take a 1/2 tablet" 03/03/15   [provider]  sodium chloride (OCEAN) 0.65 % SOLN nasal spray Place 2 sprays into both nostrils as needed for congestion. 11/24/19   Tasia Catchings, Amy V, PA-C  tiZANidine (ZANAFLEX) 4 MG tablet Take 1 tablet (4 mg total) by mouth at bedtime. Patient not taking: Reported on 05/23/2022 03/03/21   Jaynee Eagles, PA-C  triamcinolone ointment (KENALOG) 0.5 % Apply 1 application topically 2 (two) times daily. 08/14/19   Hall-Potvin, [place],  PA-C  trimethoprim-polymyxin b (POLYTRIM) ophthalmic solution Place 1 drop into the left eye every 4 (four) hours. Patient not taking: Reported on 05/23/2022 05/31/20   Hall-Potvin, [place], PA-C    Family History Family History  Problem Relation Age of Onset   Breast cancer Mother    Hyperlipidemia Mother        entire family   Hypertension Mother    Aneurysm Father        AAA   Stroke Father    Hypertension Sister    Breast cancer Paternal Grandmother    Hypertension Brother     Social History Social History   Tobacco Use   Smoking status: Never   Smokeless tobacco: Never  Vaping Use   Vaping Use: Never used  Substance Use Topics   Alcohol use: No   Drug use: No     Allergies   Bactrim [sulfamethoxazole-trimethoprim], Penicillins, Propoxyphene hcl, Statins, and Sulfa antibiotics   Review of Systems Review of Systems  Constitutional:  Positive for activity change. Negative for appetite change, fatigue and fever.  Musculoskeletal:  Positive for arthralgias and gait problem. Negative for joint swelling and myalgias.  Skin:  Positive for wound. Negative for color change.  Neurological:  Negative for dizziness, weakness, light-headedness, numbness and headaches.     Physical Exam Triage  Vital Signs ED Triage Vitals  Enc Vitals Group     BP 07/27/22 1113 116/75     Pulse Rate 07/27/22 1113 62     Resp 07/27/22 1113 16     Temp 07/27/22 1113 98 F (36.7 C)     Temp Source 07/27/22 1113 Oral     SpO2 07/27/22 1113 98 %     Weight --      Height --      Head Circumference --      Peak Flow --      Pain Score 07/27/22 1114 5     Pain Loc --      Pain Edu? --      Excl. in [place]? --    No data found.  Updated Vital Signs BP 116/75 (BP Location: Right Arm)   Pulse 62   Temp 98 F (36.7 C) (Oral)   Resp 16   SpO2 98%   Visual Acuity Right Eye Distance:   Left Eye Distance:   Bilateral Distance:    Right Eye Near:   Left Eye Near:    Bilateral Near:     Physical Exam Vitals reviewed.  Constitutional:      General: She is awake. She is not in acute distress.    Appearance: Normal appearance. She is well-developed. She is not ill-appearing.     Comments: Very pleasant female appears stated age in no acute distress sitting comfortably in exam room  HENT:     Head: Normocephalic and atraumatic.  Cardiovascular:     Rate and Rhythm: Normal rate and regular rhythm.     Heart sounds: Normal heart sounds, S1 normal and S2 normal. No murmur heard. Pulmonary:     Effort: Pulmonary effort is normal.     Breath sounds: Normal breath sounds. No wheezing, rhonchi or rales.     Comments: Clear to auscultation bilaterally Musculoskeletal:     Right knee: No swelling. Normal range of motion. Tenderness present over the patellar tendon. No medial joint line tenderness. No LCL laxity, MCL laxity, ACL laxity or PCL laxity.     Instability Tests: Anterior drawer test negative. Posterior drawer test  negative.     Comments: Right knee: No deformity.  Strength 5/5.  Tenderness palpation over patella.  No ligamentous laxity on exam.  Small abrasion over patella without bleeding or drainage.  Psychiatric:        Behavior: Behavior is cooperative.      UC Treatments /  Results  Labs (all labs ordered are listed, but only abnormal results are displayed) Labs Reviewed - No data to display  EKG   Radiology DG Knee Complete 4 Views Right  Result Date: 07/27/2022 CLINICAL DATA:  [address] at home 2 days ago injuring RIGHT knee on wooden floor EXAM: RIGHT KNEE - COMPLETE 4+ VIEW COMPARISON:  None FINDINGS: Question mild osseous demineralization. Joint spaces preserved. No acute fracture, dislocation, or bone destruction. No joint effusion. IMPRESSION: No acute osseous abnormalities. Electronically Signed   By: Lavonia Dana M.D.   On: 07/27/2022 11:47    Procedures Procedures (including critical care time)  Medications Ordered in UC Medications - No data to display  Initial Impression / Assessment and Plan / UC Course  I have reviewed the triage vital signs and the nursing notes.  Pertinent labs & imaging results that were available during my care of the patient were reviewed by me and considered in my medical decision making (see chart for details).     X-ray obtained given mechanism of injury showed no osseous abnormality.  Discussed symptoms are likely related to contusion.  Recommended conservative treatment including RICE protocol.  Patient was placed in brace for comfort and support.  Discussed that she should follow-up with her orthopedic provider for further evaluation and management.  If she has any worsening symptoms she needs to be reevaluated immediately to which she expressed understanding.  She did have an abrasion over her right knee.  Discussed the importance of wound care and was prescribed Bactroban ointment to be used twice daily.  Discussed that if she has any signs of infection she needs to be seen immediately.  Strict return precautions given.    Final Clinical Impressions(s) / UC Diagnoses   Final diagnoses:  Contusion of right patella, initial encounter  Abrasion of right knee, initial encounter  Fall, initial encounter     Discharge  Instructions      Your x-ray was normal.  I am hopeful that the brace will improve your pain and provide you some stability.  Continue over-the-counter medications including Tylenol for pain relief.  I have called in antibiotic ointment to address the abrasion (scratch) on your knee.  If this becomes red, swollen, painful you should be seen immediately.  Follow-up with your orthopedic provider.  If anything worsens return for reevaluation.     ED Prescriptions     Medication Sig Dispense Auth. Provider   mupirocin ointment (BACTROBAN) 2 % Apply 1 Application topically 2 (two) times daily. 22 g Elion Hocker K, PA-C      PDMP not reviewed this encounter.   Terrilee Croak, PA-C 07/27/22 1201

## 2022-07-27 NOTE — ED Notes (Signed)
Pt declined hinged brace

## 2022-07-27 NOTE — Discharge Instructions (Signed)
Your x-ray was normal.  I am hopeful that the brace will improve your pain and provide you some stability.  Continue over-the-counter medications including Tylenol for pain relief.  I have called in antibiotic ointment to address the abrasion (scratch) on your knee.  If this becomes red, swollen, painful you should be seen immediately.  Follow-up with your orthopedic provider.  If anything worsens return for reevaluation.

## 2022-08-08 IMAGING — DX DG RIBS W/ CHEST 3+V*R*
3 series · 3 of 3 positions shown · non-contrast
Comparison: None.

CLINICAL DATA: Right rib pain after fall last week.

EXAM:
RIGHT RIBS AND CHEST - 3+ VIEW

[chest pa]
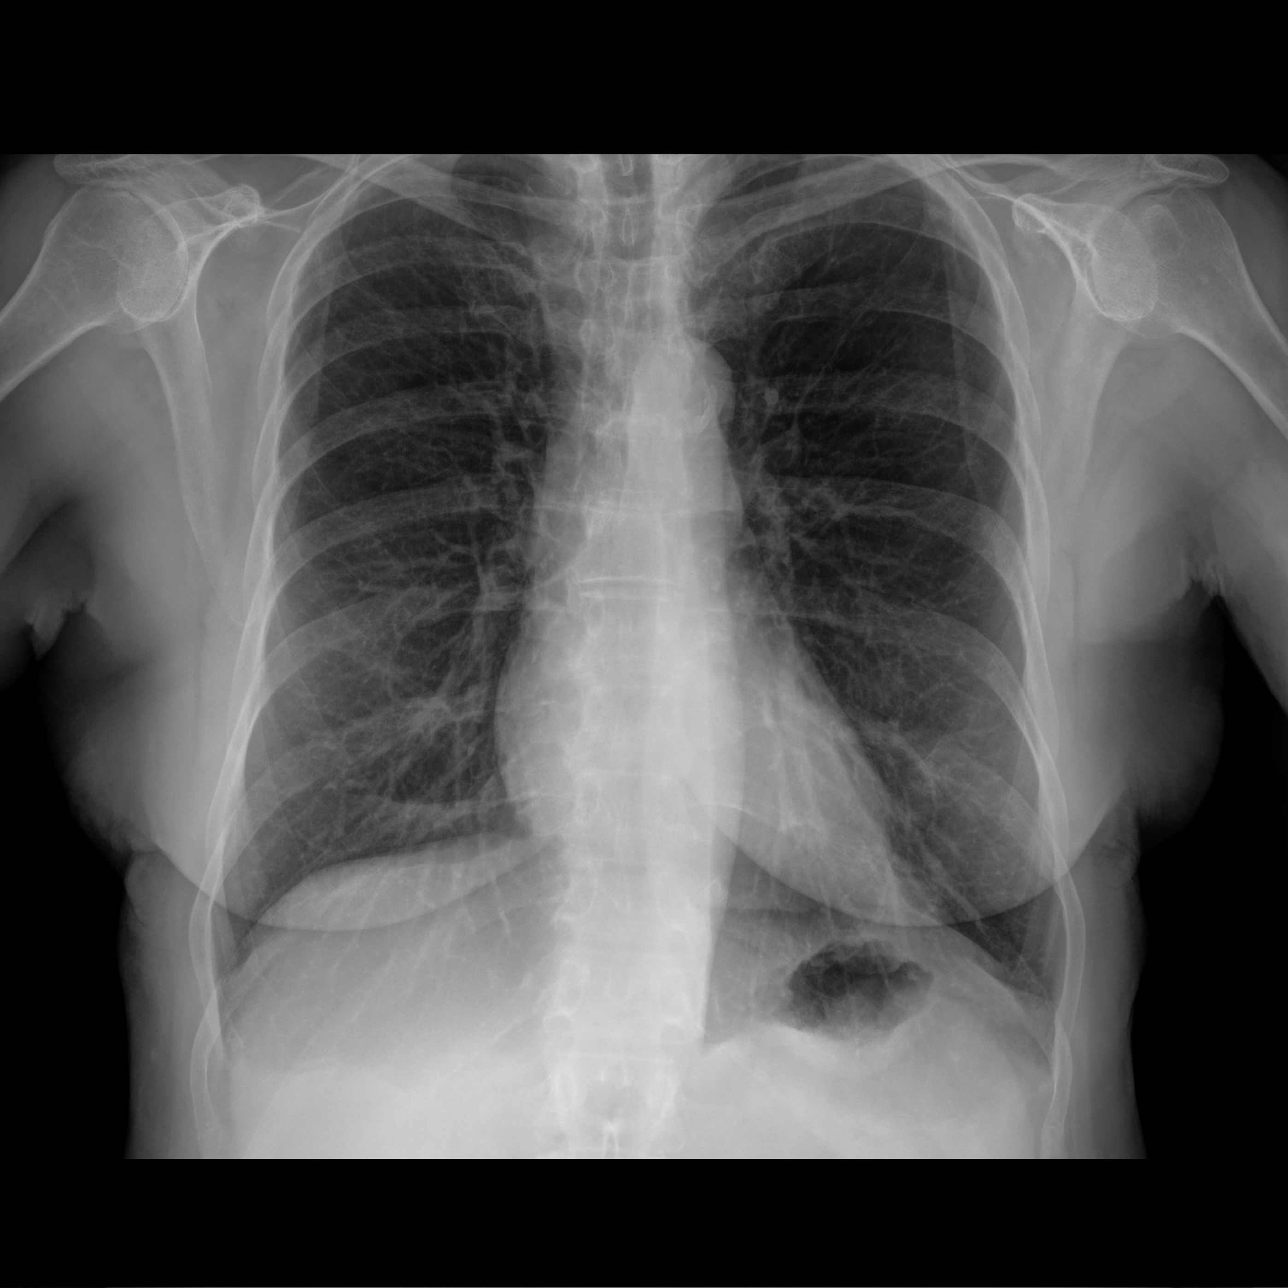

[hemithorax (ribs) pa (1 of 2)]
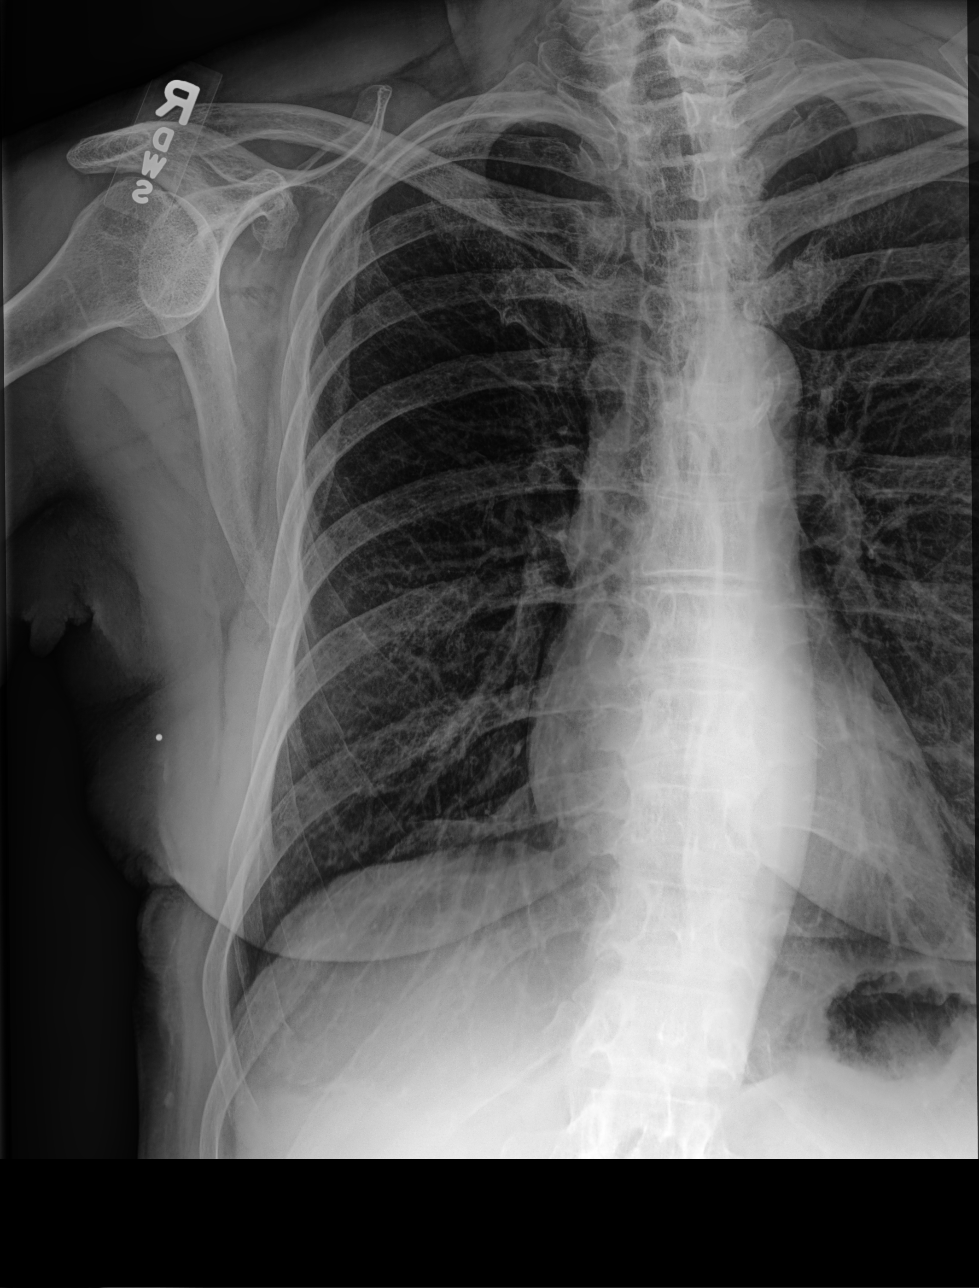

[hemithorax (ribs) pa (2 of 2)]
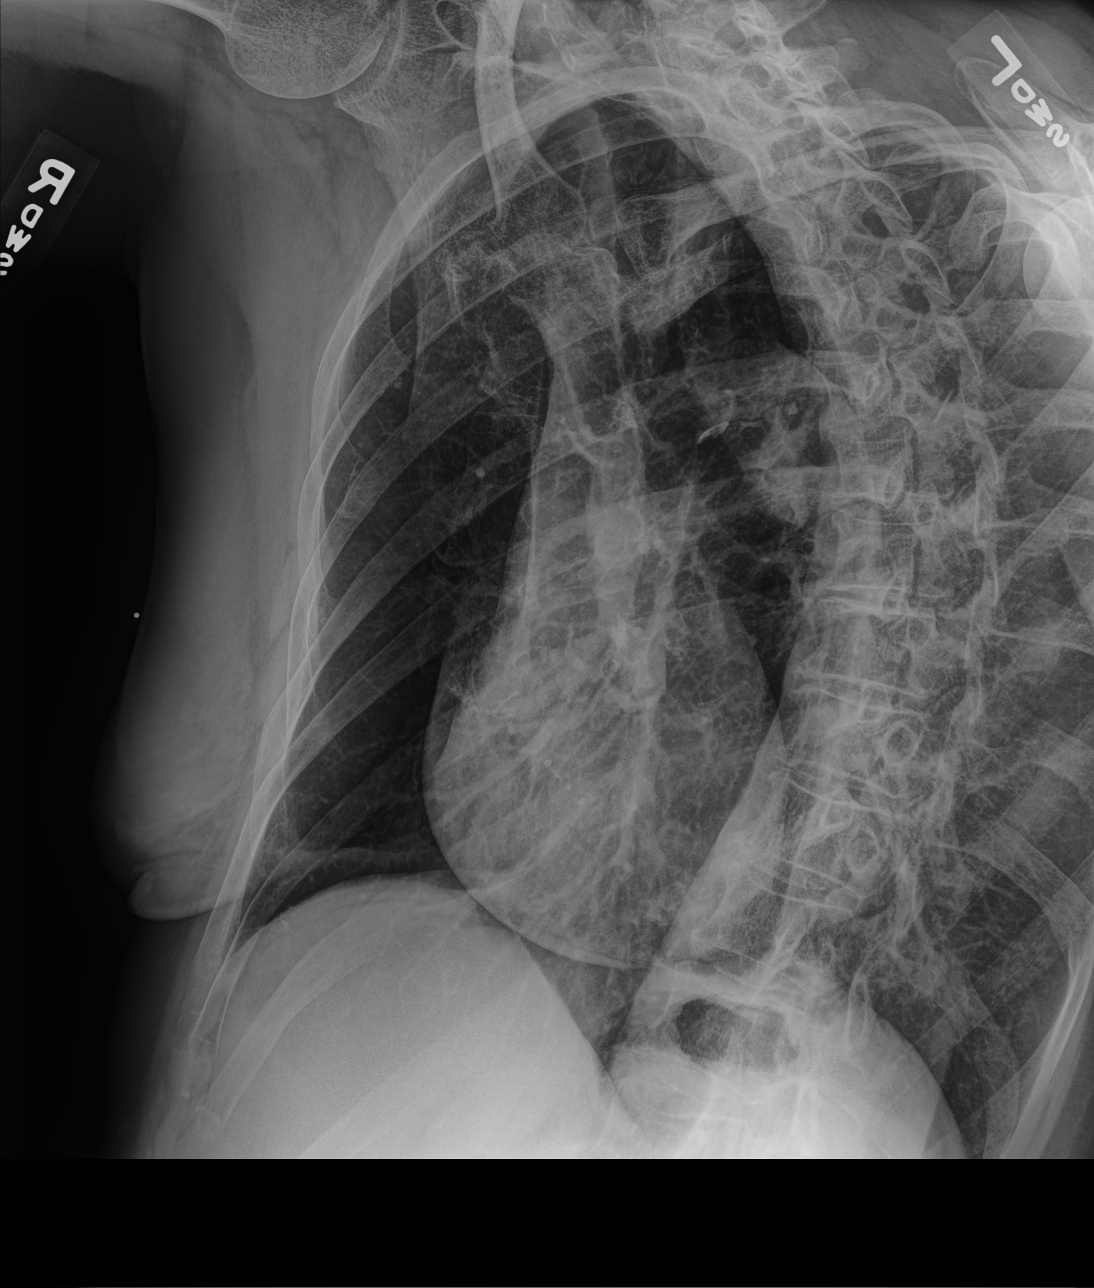

[3 of 3 positions shown; findings below may reference images not displayed]

FINDINGS: No fracture or other bone lesions are seen involving the ribs. There
is no evidence of pneumothorax or pleural effusion. Both lungs are
clear. Heart size and mediastinal contours are within normal limits.
IMPRESSION: Negative.

## 2022-08-09 ENCOUNTER — Ambulatory Visit
Admission: RE | Admit: 2022-08-09 | Discharge: 2022-08-09 | Disposition: A | Discharge status: Home / Self Care | Payer: Medicare Other | Source: Ambulatory Visit | Attending: Physician Assistant | Admitting: Physician Assistant

## 2022-08-09 VITALS — BP 129/75 | HR 53 | Temp 97.5°F | Resp 19

## 2022-08-09 DIAGNOSIS — N3000 Acute cystitis without hematuria: Secondary | ICD-10-CM | POA: Insufficient documentation

## 2022-08-09 LAB — POCT URINALYSIS DIP (MANUAL ENTRY)
Bilirubin, UA: NEGATIVE
Glucose, UA: NEGATIVE mg/dL
Ketones, POC UA: NEGATIVE mg/dL
Nitrite, UA: NEGATIVE
Protein Ur, POC: NEGATIVE mg/dL
Spec Grav, UA: 1.025 (ref 1.010–1.025)
Urobilinogen, UA: 0.2 E.U./dL
pH, UA: 5.5 (ref 5.0–8.0)

## 2022-08-09 MED ORDER — FLUCONAZOLE 150 MG PO TABS: 150.0000 mg | ORAL_TABLET | Freq: Every day | ORAL | 0 refills | Status: DC

## 2022-08-09 MED ORDER — CIPROFLOXACIN HCL 500 MG PO TABS: 500.0000 mg | ORAL_TABLET | Freq: Two times a day (BID) | ORAL | 0 refills | Status: DC

## 2022-08-09 NOTE — ED Provider Notes (Addendum)
[facility]    CSN: [id number] Arrival date & time: 08/09/22  1047      History   Chief Complaint Chief Complaint  Patient presents with   Back Pain    Could be UTI. Hard to urinate. Pain on side and back. - Entered by patient    HPI Alice [name] is a 71 y.o. female.   Patient here today for evaluation of dysuria, back pain that started recently.  She states she was seen for UTI recently and is concerned that she might have a return of same.  She does have chronic back pain but states that she can tell when she has a UTI based on her back pain.  She denies any fever.  She has not had any abdominal pain, nausea or vomiting.  She has not tried any over-the-counter medication for symptoms.  The history is provided by the patient.  Back Pain Associated symptoms: dysuria   Associated symptoms: no abdominal pain and no fever     Past Medical History:  Diagnosis Date   Anorexia    HISTORY OF    Anxiety    Cancer ([place])    basal cell carcinoma per right side under arm    DDD (degenerative disc disease), lumbar    Depression    GERD (gastroesophageal reflux disease)    H/O hiatal hernia    History of stomach ulcers    Hyperlipidemia    Kidney stone    Sciatica    getting prednisone inj/epidural injections   Tinnitus     Patient Active Problem List   Diagnosis Date Noted   Idiopathic peripheral neuropathy 11/18/2020   Pain in left foot 07/14/2019   Closed fracture of fifth metatarsal bone 07/08/2019   Menopausal syndrome 04/04/2018   Greater trochanteric bursitis of left hip 11/07/2015   Trochanteric bursitis of left hip 11/07/2015   Hydrosalpinx 09/19/2015   Spinal stenosis 06/09/2015   Esophageal reflux 07/23/2014   Dysphagia 07/23/2014   Musculoskeletal pain 09/22/2012   Special screening for malignant neoplasms, colon 09/22/2012   Atypical hyperplasia of breast 08/11/2012   IRRITABLE BOWEL SYNDROME 03/21/2009   Constipation 03/02/2009    ABDOMINAL BLOATING 03/02/2009   ABDOMINAL PAIN-EPIGASTRIC 03/02/2009   ABDOMINAL PAIN-MULTIPLE SITES 03/02/2009   COLONIC POLYPS 03/01/2009   DM 03/01/2009   HYPERCHOLESTEROLEMIA 03/01/2009   ANXIETY DEPRESSION 03/01/2009   HYPERTENSION 03/01/2009   ESOPHAGITIS 03/01/2009   GASTRITIS, CHRONIC 03/01/2009   NEPHROLITHIASIS 03/01/2009    Past Surgical History:  Procedure Laterality Date   APPENDECTOMY  1959   BACK SURGERY     with rods and screws placed in lumbar area    BREAST BIOPSY Right    benign cyst   COLONOSCOPY  2015   dilation of esophagus and polypectomy   CYST EXCISION Right    foot   CYSTOSCOPY KIDNEY W/ URETERAL GUIDE WIRE     ELBOW SURGERY Left 2003   EXCISION/RELEASE BURSA HIP Left 11/07/2015   Procedure: LEFT HIP BURSECTOMY WITH GLUTEAL TENDON REPAIR;  Surgeon: Gaynelle Arabian, MD;  Location: [facility];  Service: Orthopedics;  Laterality: Left;   EYE SURGERY Right 2012   right-pterigium   ROTATOR CUFF REPAIR Left 2003   TONSILLECTOMY  1976   UPPER GASTROINTESTINAL ENDOSCOPY  2015    OB History   No obstetric history on file.      Home Medications    Prior to Admission medications   Medication Sig Start Date End Date Taking? Authorizing Provider  fluconazole (DIFLUCAN) 150 MG tablet Take 1 tablet (150 mg total) by mouth daily. 08/09/22  Yes Francene Finders, PA-C  chlordiazePOXIDE (LIBRIUM) 10 MG capsule Take 10 mg by mouth daily as needed (stomach pain).     [provider]  ciprofloxacin (CIPRO) 500 MG tablet Take 1 tablet (500 mg total) by mouth every 12 (twelve) hours. 08/09/22   Francene Finders, PA-C  clonazePAM (KLONOPIN) 1 MG tablet Take 1 mg by mouth at bedtime.    [provider]  clonazePAM (KLONOPIN) 2 MG tablet Take 2 mg by mouth at bedtime as needed.    [provider]  doxycycline (ORACEA) 40 MG capsule Take 40 mg by mouth See admin instructions. Take 1 capsule (40 mg) by mouth daily for 3 nights for rosacea breakouts     [provider]  Estradiol-Norethindrone Acet 0.5-0.1 MG tablet Activella 0.5 mg-0.1 mg tablet  Take 1 tablet every day by oral route for 90 days. 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  03/16/15   [provider]  gabapentin (NEURONTIN) 100 MG capsule Take 100 mg by mouth at bedtime. 10/05/15   [provider]  meclizine (ANTIVERT) 12.5 MG tablet Take 12.5 mg by mouth as needed.    [provider]  meloxicam (MOBIC) 7.5 MG tablet Take 1 tablet (7.5 mg total) by mouth daily. Patient not taking: Reported on 05/23/2022 03/03/21   Jaynee Eagles, PA-C  metroNIDAZOLE (METROCREAM) 0.75 % cream Apply 1 application topically See admin instructions. Applies twice a day during rosacea breakouts 12/02/14   [provider]  mupirocin ointment (BACTROBAN) 2 % Apply 1 Application topically 2 (two) times daily. 07/27/22   Raspet, Derry Skill, PA-C  neomycin-polymyxin-hydrocortisone (CORTISPORIN) OTIC solution Apply 1-2 drops to toe after soaking twice a day Patient not taking: Reported on 05/23/2022 02/05/19   Wallene Huh, DPM  nystatin (MYCOSTATIN) 100000 UNIT/ML suspension Take 5 mLs (500,000 Units total) by mouth 4 (four) times daily. 11/04/20   Scot Jun, FNP  Omega-3 Fatty Acids (FISH OIL PO) Fish Oil    [provider]  pantoprazole (PROTONIX) 40 MG tablet Take 1 tablet (40 mg total) by mouth daily. 08/31/14   Inda Castle, MD  phenazopyridine (PYRIDIUM) 200 MG tablet Take 1 tablet (200 mg total) by mouth 3 (three) times daily as needed for pain. 01/31/21   Jaynee Eagles, PA-C  Polyethyl Glycol-Propyl Glycol (SYSTANE) 0.4-0.3 % SOLN Apply 1 drop to eye 4 (four) times daily as needed. 06/09/19   Melynda Ripple, MD  sertraline (ZOLOFT) 50 MG tablet Take 50 mg by mouth at bedtime. Pt stated, "I only take a 1/2 tablet" 03/03/15   [provider]  sodium  chloride (OCEAN) 0.65 % SOLN nasal spray Place 2 sprays into both nostrils as needed for congestion. 11/24/19   Tasia Catchings, Amy V, PA-C  tiZANidine (ZANAFLEX) 4 MG tablet Take 1 tablet (4 mg total) by mouth at bedtime. Patient not taking: Reported on 05/23/2022 03/03/21   Jaynee Eagles, PA-C  triamcinolone ointment (KENALOG) 0.5 % Apply 1 application topically 2 (two) times daily. 08/14/19   Hall-Potvin, [place], PA-C  trimethoprim-polymyxin b (POLYTRIM) ophthalmic solution Place 1 drop into the left eye every 4 (four) hours. Patient not taking: Reported on 05/23/2022 05/31/20   Hall-Potvin, [place], PA-C    Family History Family History  Problem Relation Age of Onset   Breast cancer Mother  Hyperlipidemia Mother        entire family   Hypertension Mother    Aneurysm Father        AAA   Stroke Father    Hypertension Sister    Breast cancer Paternal Grandmother    Hypertension Brother     Social History Social History   Tobacco Use   Smoking status: Never   Smokeless tobacco: Never  Vaping Use   Vaping Use: Never used  Substance Use Topics   Alcohol use: No   Drug use: No     Allergies   Bactrim [sulfamethoxazole-trimethoprim], Penicillins, Propoxyphene hcl, Statins, and Sulfa antibiotics   Review of Systems Review of Systems  Constitutional:  Negative for chills and fever.  Respiratory:  Negative for shortness of breath.   Gastrointestinal:  Negative for abdominal pain, nausea and vomiting.  Genitourinary:  Positive for dysuria.  Musculoskeletal:  Positive for back pain.     Physical Exam Triage Vital Signs ED Triage Vitals  Enc Vitals Group     BP      Pulse      Resp      Temp      Temp src      SpO2      Weight      Height      Head Circumference      Peak Flow      Pain Score      Pain Loc      Pain Edu?      Excl. in [place]?    No data found.  Updated Vital Signs BP 129/75   Pulse (!) 53   Temp (!) 97.5 F (36.4 C)   Resp 19   SpO2 97%       Physical Exam Vitals and nursing note reviewed.  Constitutional:      General: She is not in acute distress.    Appearance: Normal appearance. She is not ill-appearing.  HENT:     Head: Normocephalic and atraumatic.     Nose: Nose normal.  Cardiovascular:     Rate and Rhythm: Normal rate and regular rhythm.     Heart sounds: Normal heart sounds. No murmur heard. Pulmonary:     Effort: Pulmonary effort is normal. No respiratory distress.     Breath sounds: Normal breath sounds. No wheezing, rhonchi or rales.  Skin:    General: Skin is warm and dry.  Neurological:     Mental Status: She is alert.  Psychiatric:        Mood and Affect: Mood normal.        Thought Content: Thought content normal.      UC Treatments / Results  Labs (all labs ordered are listed, but only abnormal results are displayed) Labs Reviewed  POCT URINALYSIS DIP (MANUAL ENTRY) - Abnormal; Notable for the following components:      Result Value   Color, UA light yellow (*)    Blood, UA trace-intact (*)    Leukocytes, UA Small (1+) (*)    All other components within normal limits  URINE CULTURE    EKG   Radiology No results found.  Procedures Procedures (including critical care time)  Medications Ordered in UC Medications - No data to display  Initial Impression / Assessment and Plan / UC Course  I have reviewed the triage vital signs and the nursing notes.  Pertinent labs & imaging results that were available during my care of the patient were reviewed by me  and considered in my medical decision making (see chart for details).    We will treat to cover UTI.  Urine culture ordered.  Encouraged follow-up if symptoms do not improve or worsen.  Recommended she discuss recurrent UTI with PCP.  Diflucan prescribed at patient request to cover yeast infection that may accompany antibiotic therapy.  Final Clinical Impressions(s) / UC Diagnoses   Final diagnoses:  Acute cystitis without  hematuria   Discharge Instructions   None    ED Prescriptions     Medication Sig Dispense Auth. Provider   ciprofloxacin (CIPRO) 500 MG tablet Take 1 tablet (500 mg total) by mouth every 12 (twelve) hours. 10 tablet Ewell Poe F, PA-C   fluconazole (DIFLUCAN) 150 MG tablet Take 1 tablet (150 mg total) by mouth daily. 2 tablet Francene Finders, PA-C      PDMP not reviewed this encounter.   Francene Finders, PA-C 08/09/22 1928    Francene Finders, PA-C 08/09/22 1929

## 2022-08-09 NOTE — ED Triage Notes (Signed)
Pt presents to uc with co of dysuria, back pain and itching sensation. Pt reports recent uti and concern for reinfection. Pt also has chronic back pain. Pt reports she has not attempted any otc medications

## 2022-08-10 LAB — URINE CULTURE

## 2022-08-18 DIAGNOSIS — M25561 Pain in right knee: Secondary | ICD-10-CM | POA: Diagnosis not present

## 2022-09-05 DIAGNOSIS — M25561 Pain in right knee: Secondary | ICD-10-CM | POA: Diagnosis not present

## 2022-09-17 DIAGNOSIS — M25561 Pain in right knee: Secondary | ICD-10-CM | POA: Diagnosis not present

## 2022-09-27 DIAGNOSIS — N183 Chronic kidney disease, stage 3 unspecified: Secondary | ICD-10-CM | POA: Diagnosis not present

## 2022-09-27 DIAGNOSIS — F5101 Primary insomnia: Secondary | ICD-10-CM | POA: Diagnosis not present

## 2022-09-27 DIAGNOSIS — G2581 Restless legs syndrome: Secondary | ICD-10-CM | POA: Diagnosis not present

## 2022-09-27 DIAGNOSIS — E7800 Pure hypercholesterolemia, unspecified: Secondary | ICD-10-CM | POA: Diagnosis not present

## 2022-09-27 DIAGNOSIS — M545 Low back pain, unspecified: Secondary | ICD-10-CM | POA: Diagnosis not present

## 2022-10-08 DIAGNOSIS — S00411A Abrasion of right ear, initial encounter: Secondary | ICD-10-CM | POA: Diagnosis not present

## 2022-10-09 ENCOUNTER — Ambulatory Visit: Payer: Medicare Other

## 2022-10-11 DIAGNOSIS — H60391 Other infective otitis externa, right ear: Secondary | ICD-10-CM | POA: Diagnosis not present

## 2022-10-22 ENCOUNTER — Ambulatory Visit: Payer: Medicare Other

## 2022-10-23 ENCOUNTER — Ambulatory Visit
Admission: RE | Admit: 2022-10-23 | Discharge: 2022-10-23 | Disposition: A | Discharge status: Home / Self Care | Payer: Medicare Other | Source: Ambulatory Visit | Attending: Family Medicine | Admitting: Family Medicine

## 2022-10-23 DIAGNOSIS — Z1231 Encounter for screening mammogram for malignant neoplasm of breast: Secondary | ICD-10-CM

## 2022-11-13 DIAGNOSIS — G4700 Insomnia, unspecified: Secondary | ICD-10-CM | POA: Diagnosis not present

## 2022-12-27 DIAGNOSIS — L719 Rosacea, unspecified: Secondary | ICD-10-CM | POA: Diagnosis not present

## 2022-12-27 DIAGNOSIS — E7800 Pure hypercholesterolemia, unspecified: Secondary | ICD-10-CM | POA: Diagnosis not present

## 2022-12-27 DIAGNOSIS — G8929 Other chronic pain: Secondary | ICD-10-CM | POA: Diagnosis not present

## 2022-12-27 DIAGNOSIS — F5101 Primary insomnia: Secondary | ICD-10-CM | POA: Diagnosis not present

## 2023-01-20 ENCOUNTER — Other Ambulatory Visit: Payer: Self-pay

## 2023-01-20 ENCOUNTER — Ambulatory Visit
Admission: RE | Admit: 2023-01-20 | Discharge: 2023-01-20 | Disposition: A | Discharge status: Home / Self Care | Payer: Medicare Other | Source: Ambulatory Visit | Attending: Emergency Medicine | Admitting: Emergency Medicine

## 2023-01-20 VITALS — BP 126/44 | HR 85 | Temp 98.0°F | Resp 18

## 2023-01-20 DIAGNOSIS — H00015 Hordeolum externum left lower eyelid: Secondary | ICD-10-CM

## 2023-01-20 DIAGNOSIS — M5416 Radiculopathy, lumbar region: Secondary | ICD-10-CM | POA: Diagnosis not present

## 2023-01-20 MED ORDER — PREDNISONE 20 MG PO TABS: 40.0000 mg | ORAL_TABLET | Freq: Every day | ORAL | 0 refills | Status: DC

## 2023-01-20 MED ORDER — DICLOFENAC SODIUM 1 % EX GEL: 2.0000 g | Freq: Four times a day (QID) | CUTANEOUS | 1 refills | Status: DC

## 2023-01-20 MED ORDER — ERYTHROMYCIN 5 MG/GM OP OINT: TOPICAL_OINTMENT | OPHTHALMIC | 0 refills | Status: DC

## 2023-01-20 NOTE — ED Provider Notes (Signed)
[name]    CSN: [id number] Arrival date & time: 01/20/23  1349      History   Chief Complaint Chief Complaint  Patient presents with   Eye Problem    Entered by patient   Back Pain    HPI Alice [name] is a 72 y.o. female.   Tendr and sweelling, bump to lower lids, for 2 days, starte after messing in a truck bed,   Eyes drops, erthymcyin,    Eft butt ain radites , prior back surgery, down left leg, difficulty sitting and bending, no numvness and tingling, 2-3, has been moving items  to clear a house,  No treatment   Past Medical History:  Diagnosis Date   Anorexia    HISTORY OF    Anxiety    Cancer ([place])    basal cell carcinoma per right side under arm    DDD (degenerative disc disease), lumbar    Depression    GERD (gastroesophageal reflux disease)    H/O hiatal hernia    History of stomach ulcers    Hyperlipidemia    Kidney stone    Sciatica    getting prednisone inj/epidural injections   Tinnitus     Patient Active Problem List   Diagnosis Date Noted   Idiopathic peripheral neuropathy 11/18/2020   Pain in left foot 07/14/2019   Closed fracture of fifth metatarsal bone 07/08/2019   Menopausal syndrome 04/04/2018   Greater trochanteric bursitis of left hip 11/07/2015   Trochanteric bursitis of left hip 11/07/2015   Hydrosalpinx 09/19/2015   Spinal stenosis 06/09/2015   Esophageal reflux 07/23/2014   Dysphagia 07/23/2014   Musculoskeletal pain 09/22/2012   Special screening for malignant neoplasms, colon 09/22/2012   Atypical hyperplasia of breast 08/11/2012   IRRITABLE BOWEL SYNDROME 03/21/2009   Constipation 03/02/2009   ABDOMINAL BLOATING 03/02/2009   ABDOMINAL PAIN-EPIGASTRIC 03/02/2009   ABDOMINAL PAIN-MULTIPLE SITES 03/02/2009   COLONIC POLYPS 03/01/2009   DM 03/01/2009   HYPERCHOLESTEROLEMIA 03/01/2009   ANXIETY DEPRESSION 03/01/2009   HYPERTENSION 03/01/2009   ESOPHAGITIS 03/01/2009   GASTRITIS, CHRONIC 03/01/2009    NEPHROLITHIASIS 03/01/2009    Past Surgical History:  Procedure Laterality Date   APPENDECTOMY  1959   BACK SURGERY     with rods and screws placed in lumbar area    BREAST BIOPSY Right    benign cyst   COLONOSCOPY  2015   dilation of esophagus and polypectomy   CYST EXCISION Right    foot   CYSTOSCOPY KIDNEY W/ URETERAL GUIDE WIRE     ELBOW SURGERY Left 2003   EXCISION/RELEASE BURSA HIP Left 11/07/2015   Procedure: LEFT HIP BURSECTOMY WITH GLUTEAL TENDON REPAIR;  Surgeon: Gaynelle Arabian, MD;  Location: [facility];  Service: Orthopedics;  Laterality: Left;   EYE SURGERY Right 2012   right-pterigium   ROTATOR CUFF REPAIR Left 2003   TONSILLECTOMY  1976   UPPER GASTROINTESTINAL ENDOSCOPY  2015    OB History   No obstetric history on file.      Home Medications    Prior to Admission medications   Medication Sig Start Date End Date Taking? Authorizing Provider  chlordiazePOXIDE (LIBRIUM) 10 MG capsule Take 10 mg by mouth daily as needed (stomach pain).     [provider]  ciprofloxacin (CIPRO) 500 MG tablet Take 1 tablet (500 mg total) by mouth every 12 (twelve) hours. 08/09/22   Francene Finders, PA-C  clonazePAM (KLONOPIN) 1 MG tablet Take 1 mg by mouth  at bedtime.    [provider]  clonazePAM (KLONOPIN) 2 MG tablet Take 2 mg by mouth at bedtime as needed.    [provider]  doxycycline (ORACEA) 40 MG capsule Take 40 mg by mouth See admin instructions. Take 1 capsule (40 mg) by mouth daily for 3 nights for rosacea breakouts    [provider]  Estradiol-Norethindrone Acet 0.5-0.1 MG tablet Activella 0.5 mg-0.1 mg tablet  Take 1 tablet every day by oral route for 90 days. 08/08/12   [provider]  fluconazole (DIFLUCAN) 150 MG tablet Take 1 tablet (150 mg total) by mouth daily. 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  03/16/15   [provider]  gabapentin (NEURONTIN) 100 MG capsule Take 100 mg by mouth at bedtime. 10/05/15   [provider]  meclizine (ANTIVERT) 12.5 MG tablet Take 12.5 mg by mouth as needed.    [provider]  meloxicam (MOBIC) 7.5 MG tablet Take 1 tablet (7.5 mg total) by mouth daily. Patient not taking: Reported on 05/23/2022 03/03/21   Jaynee Eagles, PA-C  metroNIDAZOLE (METROCREAM) 0.75 % cream Apply 1 application topically See admin instructions. Applies twice a day during rosacea breakouts 12/02/14   [provider]  mupirocin ointment (BACTROBAN) 2 % Apply 1 Application topically 2 (two) times daily. 07/27/22   Raspet, Derry Skill, PA-C  neomycin-polymyxin-hydrocortisone (CORTISPORIN) OTIC solution Apply 1-2 drops to toe after soaking twice a day Patient not taking: Reported on 05/23/2022 02/05/19   Wallene Huh, DPM  nystatin (MYCOSTATIN) 100000 UNIT/ML suspension Take 5 mLs (500,000 Units total) by mouth 4 (four) times daily. 11/04/20   Scot Jun, NP  Omega-3 Fatty Acids (FISH OIL PO) Fish Oil    [provider]  pantoprazole (PROTONIX) 40 MG tablet Take 1 tablet (40 mg total) by mouth daily. 08/31/14   Inda Castle, MD  phenazopyridine (PYRIDIUM) 200 MG tablet Take 1 tablet (200 mg total) by mouth 3 (three) times daily as needed for pain. 01/31/21   Jaynee Eagles, PA-C  Polyethyl Glycol-Propyl Glycol (SYSTANE) 0.4-0.3 % SOLN Apply 1 drop to eye 4 (four) times daily as needed. 06/09/19   Melynda Ripple, MD  sertraline (ZOLOFT) 50 MG tablet Take 50 mg by mouth at bedtime. Pt stated, "I only take a 1/2 tablet" 03/03/15   [provider]  sodium chloride (OCEAN) 0.65 % SOLN nasal spray Place 2 sprays into both nostrils as needed for congestion. 11/24/19   Tasia Catchings, Amy V, PA-C  tiZANidine (ZANAFLEX) 4 MG tablet Take 1 tablet (4 mg total) by mouth at bedtime. Patient not taking: Reported on 05/23/2022 03/03/21   Jaynee Eagles, PA-C  triamcinolone ointment (KENALOG) 0.5 %  Apply 1 application topically 2 (two) times daily. 08/14/19   Hall-Potvin, [place], PA-C  trimethoprim-polymyxin b (POLYTRIM) ophthalmic solution Place 1 drop into the left eye every 4 (four) hours. Patient not taking: Reported on 05/23/2022 05/31/20   Hall-Potvin, [place], PA-C    Family History Family History  Problem Relation Age of Onset   Breast cancer Mother    Hyperlipidemia Mother        entire family   Hypertension Mother    Aneurysm Father        AAA   Stroke Father    Hypertension Sister    Breast cancer Paternal Grandmother    Hypertension Brother     Social History  Social History   Tobacco Use   Smoking status: Never   Smokeless tobacco: Never  Vaping Use   Vaping Use: Never used  Substance Use Topics   Alcohol use: No   Drug use: No     Allergies   Bactrim [sulfamethoxazole-trimethoprim], Penicillins, Propoxyphene hcl, Statins, and Sulfa antibiotics   Review of Systems Review of Systems  Musculoskeletal:  Positive for back pain.     Physical Exam Triage Vital Signs ED Triage Vitals  Enc Vitals Group     BP 01/20/23 1422 (!) 126/44     Pulse Rate 01/20/23 1422 85     Resp 01/20/23 1422 18     Temp 01/20/23 1422 98 F (36.7 C)     Temp Source 01/20/23 1422 Oral     SpO2 01/20/23 1422 97 %     Weight --      Height --      Head Circumference --      Peak Flow --      Pain Score 01/20/23 1423 3     Pain Loc --      Pain Edu? --      Excl. in [place]? --    No data found.  Updated Vital Signs BP (!) 126/44 (BP Location: Left Arm)   Pulse 85   Temp 98 F (36.7 C) (Oral)   Resp 18   SpO2 97%   Visual Acuity Right Eye Distance:   Left Eye Distance:   Bilateral Distance:    Right Eye Near:   Left Eye Near:    Bilateral Near:     Physical Exam   UC Treatments / Results  Labs (all labs ordered are listed, but only abnormal results are displayed) Labs Reviewed - No data to display  EKG   Radiology No results  found.  Procedures Procedures (including critical care time)  Medications Ordered in UC Medications - No data to display  Initial Impression / Assessment and Plan / UC Course  I have reviewed the triage vital signs and the nursing notes.  Pertinent labs & imaging results that were available during my care of the patient were reviewed by me and considered in my medical decision making (see chart for details).     *** Final Clinical Impressions(s) / UC Diagnoses   Final diagnoses:  None   Discharge Instructions   None    ED Prescriptions   None    PDMP not reviewed this encounter.

## 2023-01-20 NOTE — ED Triage Notes (Signed)
Pt here for swelling to left lower eye lid x 2 days; pt also sts some pain in left buttocks area with radiation down left leg

## 2023-01-20 NOTE — Discharge Instructions (Signed)
For your -Stye is present to the left lower lid -Place erythromycin ointment every morning and every evening for 7 days -Hold warm compresses over the affected area -Should clear with time -If stye begins to affect vision or you begin to have redness, increased pain, drainage please follow-up for reevaluation  For your back -Most likely related to your chronic back pain flare by you moving objects -Begin prednisone every morning with food for 5 days to reduce inflammation, you may take Tylenol in addition to this medicine -You may apply diclofenac gel every 6 hours as needed over your back for additional comfort -May use ice or heat over the affected area -Recommend daily stretching -May massage as tolerated -May follow-up with his urgent care or with your orthopedic doctor as needed

## 2023-01-29 DIAGNOSIS — B370 Candidal stomatitis: Secondary | ICD-10-CM | POA: Diagnosis not present

## 2023-01-29 DIAGNOSIS — N183 Chronic kidney disease, stage 3 unspecified: Secondary | ICD-10-CM | POA: Diagnosis not present

## 2023-01-29 DIAGNOSIS — R300 Dysuria: Secondary | ICD-10-CM | POA: Diagnosis not present

## 2023-02-11 ENCOUNTER — Telehealth: Payer: Self-pay

## 2023-02-11 ENCOUNTER — Ambulatory Visit
Admission: EM | Admit: 2023-02-11 | Discharge: 2023-02-11 | Disposition: A | Discharge status: Home / Self Care | Payer: Medicare Other | Attending: Internal Medicine | Admitting: Internal Medicine

## 2023-02-11 DIAGNOSIS — H00011 Hordeolum externum right upper eyelid: Secondary | ICD-10-CM

## 2023-02-11 DIAGNOSIS — L03213 Periorbital cellulitis: Secondary | ICD-10-CM

## 2023-02-11 MED ORDER — FLUCONAZOLE 150 MG PO TABS: ORAL_TABLET | ORAL | 0 refills | Status: DC

## 2023-02-11 MED ORDER — CEFDINIR 300 MG PO CAPS: 300.0000 mg | ORAL_CAPSULE | Freq: Two times a day (BID) | ORAL | 0 refills | Status: AC

## 2023-02-11 NOTE — Telephone Encounter (Signed)
Pt returned to clinic concerned for yeast infection that occurs when taking oral abx. Provider onsite approved verbal fluconazole order. Sent to pharmacy.

## 2023-02-11 NOTE — Discharge Instructions (Addendum)
Take cefdinir antibiotic twice daily for the next 7 days to treat infection to the right eye.  Please schedule an appointment with your eye doctor for follow-up and further evaluation of your vision.  Stop using ointment to your right eye.   If you develop any new or worsening symptoms or do not improve in the next 2 to 3 days, please return.  If your symptoms are severe, please go to the emergency room.  Follow-up with your primary care provider for further evaluation and management of your symptoms as well as ongoing wellness visits.  I hope you feel better!

## 2023-02-11 NOTE — ED Triage Notes (Signed)
Pt c/o right eyelid irritation. States this occurred recently on the left eye was dx and tx with stye now it is appearing on the right eye. Says she has been trying the same tx previously given for left eye but has ran out.

## 2023-02-11 NOTE — ED Provider Notes (Signed)
[name]    CSN: [id number] Arrival date & time: 02/11/23  1441      History   Chief Complaint Chief Complaint  Patient presents with   right eye lid thing    HPI Alice [name] is a 72 y.o. female.   Patient presents to urgent care for evaluation of hordeolum to the right upper eyelid that started approximately 1 week ago. Patient had hordeolum to the left lower eyelid and was treated for this with erythromycin eye ointment. She states the infection to the left eye has improved significantly with use of eye ointment so she began to use the ointment to the right eye due to similar symptoms 1 week ago. After using for 1 week, redness and swelling to the right upper eyelid remains and she states she believes the redness may be worsening. States she has not been to the eye doctor in "a long time" and does not wear glasses or contacts. Denies fever, chills, headache, dizziness, ear pain, sore throat, vision changes, redness to the conjunctiva, and recent use of new makeup or facial cream. Denies recent trauma/injuries to the eyes. Denies recent oral antibiotic or steroid use. Has been applying the erythromycin eye ointment and using warm compresses for the last week without relief of hordeolum to the right eye.     Past Medical History:  Diagnosis Date   Anorexia    HISTORY OF    Anxiety    Cancer ([place])    basal cell carcinoma per right side under arm    DDD (degenerative disc disease), lumbar    Depression    GERD (gastroesophageal reflux disease)    H/O hiatal hernia    History of stomach ulcers    Hyperlipidemia    Kidney stone    Sciatica    getting prednisone inj/epidural injections   Tinnitus     Patient Active Problem List   Diagnosis Date Noted   Idiopathic peripheral neuropathy 11/18/2020   Pain in left foot 07/14/2019   Closed fracture of fifth metatarsal bone 07/08/2019   Menopausal syndrome 04/04/2018   Greater trochanteric bursitis of left  hip 11/07/2015   Trochanteric bursitis of left hip 11/07/2015   Hydrosalpinx 09/19/2015   Spinal stenosis 06/09/2015   Esophageal reflux 07/23/2014   Dysphagia 07/23/2014   Musculoskeletal pain 09/22/2012   Special screening for malignant neoplasms, colon 09/22/2012   Atypical hyperplasia of breast 08/11/2012   IRRITABLE BOWEL SYNDROME 03/21/2009   Constipation 03/02/2009   ABDOMINAL BLOATING 03/02/2009   ABDOMINAL PAIN-EPIGASTRIC 03/02/2009   ABDOMINAL PAIN-MULTIPLE SITES 03/02/2009   COLONIC POLYPS 03/01/2009   DM 03/01/2009   HYPERCHOLESTEROLEMIA 03/01/2009   ANXIETY DEPRESSION 03/01/2009   HYPERTENSION 03/01/2009   ESOPHAGITIS 03/01/2009   GASTRITIS, CHRONIC 03/01/2009   NEPHROLITHIASIS 03/01/2009    Past Surgical History:  Procedure Laterality Date   APPENDECTOMY  1959   BACK SURGERY     with rods and screws placed in lumbar area    BREAST BIOPSY Right    benign cyst   COLONOSCOPY  2015   dilation of esophagus and polypectomy   CYST EXCISION Right    foot   CYSTOSCOPY KIDNEY W/ URETERAL GUIDE WIRE     ELBOW SURGERY Left 2003   EXCISION/RELEASE BURSA HIP Left 11/07/2015   Procedure: LEFT HIP BURSECTOMY WITH GLUTEAL TENDON REPAIR;  Surgeon: Gaynelle Arabian, MD;  Location: [facility];  Service: Orthopedics;  Laterality: Left;   EYE SURGERY Right 2012   right-pterigium   ROTATOR  CUFF REPAIR Left 2003   TONSILLECTOMY  1976   UPPER GASTROINTESTINAL ENDOSCOPY  2015    OB History   No obstetric history on file.      Home Medications    Prior to Admission medications   Medication Sig Start Date End Date Taking? Authorizing Provider  cefdinir (OMNICEF) 300 MG capsule Take 1 capsule (300 mg total) by mouth 2 (two) times daily for 7 days. 02/11/23 02/18/23 Yes Alaja Goldinger, Stasia Cavalier, FNP  chlordiazePOXIDE (LIBRIUM) 10 MG capsule Take 10 mg by mouth daily as needed (stomach pain).     [provider]  ciprofloxacin (CIPRO) 500 MG tablet Take 1 tablet (500 mg total) by  mouth every 12 (twelve) hours. 08/09/22   Francene Finders, PA-C  clonazePAM (KLONOPIN) 1 MG tablet Take 1 mg by mouth at bedtime.    [provider]  clonazePAM (KLONOPIN) 2 MG tablet Take 2 mg by mouth at bedtime as needed.    [provider]  diclofenac Sodium (VOLTAREN) 1 % GEL Apply 2 g topically 4 (four) times daily. 01/20/23   Hans Eden, NP  doxycycline (ORACEA) 40 MG capsule Take 40 mg by mouth See admin instructions. Take 1 capsule (40 mg) by mouth daily for 3 nights for rosacea breakouts    [provider]  erythromycin ophthalmic ointment Place a 1/2 inch ribbon of ointment into the lower eyelid. 01/20/23   White, Leitha Schuller, NP  Estradiol-Norethindrone Acet 0.5-0.1 MG tablet Activella 0.5 mg-0.1 mg tablet  Take 1 tablet every day by oral route for 90 days. 08/08/12   [provider]  fluconazole (DIFLUCAN) 150 MG tablet Take 1 tablet (150 mg total) by mouth daily. 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  03/16/15   [provider]  gabapentin (NEURONTIN) 100 MG capsule Take 100 mg by mouth at bedtime. 10/05/15   [provider]  meclizine (ANTIVERT) 12.5 MG tablet Take 12.5 mg by mouth as needed.    [provider]  meloxicam (MOBIC) 7.5 MG tablet Take 1 tablet (7.5 mg total) by mouth daily. Patient not taking: Reported on 05/23/2022 03/03/21   Jaynee Eagles, PA-C  metroNIDAZOLE (METROCREAM) 0.75 % cream Apply 1 application topically See admin instructions. Applies twice a day during rosacea breakouts 12/02/14   [provider]  mupirocin ointment (BACTROBAN) 2 % Apply 1 Application topically 2 (two) times daily. 07/27/22   Raspet, Derry Skill, PA-C  neomycin-polymyxin-hydrocortisone (CORTISPORIN) OTIC solution Apply 1-2 drops to toe after soaking twice a day Patient not taking: Reported on 05/23/2022 02/05/19   Wallene Huh, DPM   nystatin (MYCOSTATIN) 100000 UNIT/ML suspension Take 5 mLs (500,000 Units total) by mouth 4 (four) times daily. 11/04/20   Scot Jun, NP  Omega-3 Fatty Acids (FISH OIL PO) Fish Oil    [provider]  pantoprazole (PROTONIX) 40 MG tablet Take 1 tablet (40 mg total) by mouth daily. 08/31/14   Inda Castle, MD  phenazopyridine (PYRIDIUM) 200 MG tablet Take 1 tablet (200 mg total) by mouth 3 (three) times daily as needed for pain. 01/31/21   Jaynee Eagles, PA-C  Polyethyl Glycol-Propyl Glycol (SYSTANE) 0.4-0.3 % SOLN Apply 1 drop to eye 4 (four) times daily as needed. 06/09/19   Melynda Ripple, MD  predniSONE (DELTASONE) 20 MG tablet Take 2 tablets (40 mg total) by mouth daily. 01/20/23   Hans Eden, NP  sertraline (ZOLOFT) 50 MG tablet Take 50 mg by mouth at bedtime. Pt stated, "I only take a 1/2 tablet" 03/03/15   [provider]  sodium chloride (OCEAN) 0.65 % SOLN nasal spray Place 2 sprays into both nostrils as needed for congestion. 11/24/19   Tasia Catchings, Amy V, PA-C  tiZANidine (ZANAFLEX) 4 MG tablet Take 1 tablet (4 mg total) by mouth at bedtime. Patient not taking: Reported on 05/23/2022 03/03/21   Jaynee Eagles, PA-C  triamcinolone ointment (KENALOG) 0.5 % Apply 1 application topically 2 (two) times daily. 08/14/19   Hall-Potvin, [place], PA-C  trimethoprim-polymyxin b (POLYTRIM) ophthalmic solution Place 1 drop into the left eye every 4 (four) hours. Patient not taking: Reported on 05/23/2022 05/31/20   Hall-Potvin, [place], PA-C    Family History Family History  Problem Relation Age of Onset   Breast cancer Mother    Hyperlipidemia Mother        entire family   Hypertension Mother    Aneurysm Father        AAA   Stroke Father    Hypertension Sister    Breast cancer Paternal Grandmother    Hypertension Brother     Social History Social History   Tobacco Use   Smoking status: Never   Smokeless tobacco: Never  Vaping Use   Vaping Use: Never used   Substance Use Topics   Alcohol use: No   Drug use: No     Allergies   Bactrim [sulfamethoxazole-trimethoprim], Penicillins, Propoxyphene hcl, Statins, and Sulfa antibiotics   Review of Systems Review of Systems Per HPI  Physical Exam Triage Vital Signs ED Triage Vitals [02/11/23 1624]  Enc Vitals Group     BP 128/80     Pulse Rate 68     Resp 18     Temp 97.8 F (36.6 C)     Temp Source Oral     SpO2 98 %     Weight      Height      Head Circumference      Peak Flow      Pain Score 3     Pain Loc      Pain Edu?      Excl. in [place]?    No data found.  Updated Vital Signs BP 128/80 (BP Location: Left Arm)   Pulse 68   Temp 97.8 F (36.6 C) (Oral)   Resp 18   SpO2 98%   Visual Acuity Right Eye Distance: 20/50 Left Eye Distance: 20/40 Bilateral Distance: 20/30  Right Eye Near:   Left Eye Near:    Bilateral Near:     Physical Exam Vitals and nursing note reviewed.  Constitutional:      Appearance: She is not ill-appearing or toxic-appearing.  HENT:     Head: Normocephalic and atraumatic.     Right Ear: Hearing and external ear normal.     Left Ear: Hearing and external ear normal.     Nose: Nose normal.     Mouth/Throat:     Lips: Pink.  Eyes:     General: Lids are normal. Vision grossly intact. Gaze aligned appropriately.        Right eye: Hordeolum present. No foreign body or discharge.        Left eye: No foreign body, discharge or hordeolum.     Extraocular Movements: Extraocular movements intact.     Conjunctiva/sclera: Conjunctivae normal.     Pupils: Pupils are equal, round, and reactive to light.  Comments: Hordeolum present to the right upper eyelid with evidence of preseptal erythema, swelling, and slight warmth. No conjunctival injection bilaterally. No drainage. EOMs intact without pain or dizziness elicited.   Cardiovascular:     Rate and Rhythm: Normal rate and regular rhythm.     Heart sounds: Normal heart sounds, S1 normal and S2  normal.  Pulmonary:     Effort: Pulmonary effort is normal. No respiratory distress.     Breath sounds: Normal breath sounds and air entry.  Musculoskeletal:     Cervical back: Neck supple.  Skin:    General: Skin is warm and dry.     Capillary Refill: Capillary refill takes less than 2 seconds.     Findings: No rash.  Neurological:     General: No focal deficit present.     Mental Status: She is alert and oriented to person, place, and time. Mental status is at baseline.     Cranial Nerves: No dysarthria or facial asymmetry.  Psychiatric:        Mood and Affect: Mood normal.        Speech: Speech normal.        Behavior: Behavior normal.        Thought Content: Thought content normal.        Judgment: Judgment normal.      UC Treatments / Results  Labs (all labs ordered are listed, but only abnormal results are displayed) Labs Reviewed - No data to display  EKG   Radiology No results found.  Procedures Procedures (including critical care time)  Medications Ordered in UC Medications - No data to display  Initial Impression / Assessment and Plan / UC Course  I have reviewed the triage vital signs and the nursing notes.  Pertinent labs & imaging results that were available during my care of the patient were reviewed by me and considered in my medical decision making (see chart for details).   1. Preseptal cellulitis of right upper eyelid, hordeolum externum of right upper eyelid Hordeolum has not responded well to standard treatment with erythromycin eye ointment and symptoms have worsened. Will provide oral antibiotic coverage for preseptal cellulitis with cefdinir antibiotic twice daily for 7 days to be taken with food. Patient has many intolerances to antibiotics as they tend to upset her stomach. Reassured patient that his antibiotic will likely be tolerable and she is to take this with food to avoid stomach upset. She may also want to take a probiotic with this to  prevent further irritation to the GI system related to antibiotic use. Advised to schedule a follow-up appointment with eye specialist in the next 1-2 weeks to ensure symptoms are improving. Strict ER and urgent care return precautions discussed. She is afebrile and nontoxic in appearance with hemodynamically stable vital signs.  Discussed physical exam and available lab work findings in clinic with patient.  Counseled patient regarding appropriate use of medications and potential side effects for all medications recommended or prescribed today. Discussed red flag signs and symptoms of worsening condition,when to call the PCP office, return to urgent care, and when to seek higher level of care in the emergency department. Patient verbalizes understanding and agreement with plan. All questions answered. Patient discharged in stable condition.    Final Clinical Impressions(s) / UC Diagnoses   Final diagnoses:  Preseptal cellulitis of right upper eyelid  Hordeolum externum of right upper eyelid     Discharge Instructions      Take cefdinir antibiotic twice  daily for the next 7 days to treat infection to the right eye.  Please schedule an appointment with your eye doctor for follow-up and further evaluation of your vision.  Stop using ointment to your right eye.   If you develop any new or worsening symptoms or do not improve in the next 2 to 3 days, please return.  If your symptoms are severe, please go to the emergency room.  Follow-up with your primary care provider for further evaluation and management of your symptoms as well as ongoing wellness visits.  I hope you feel better!   ED Prescriptions     Medication Sig Dispense Auth. Provider   cefdinir (OMNICEF) 300 MG capsule Take 1 capsule (300 mg total) by mouth 2 (two) times daily for 7 days. 14 capsule Talbot Grumbling, FNP      PDMP not reviewed this encounter.   Talbot Grumbling, [place] 02/13/23 1857

## 2023-03-27 IMAGING — MG MM DIGITAL SCREENING BILAT W/ TOMO AND CAD
6 of 10 series · 6 of 30 positions shown · non-contrast
Comparison: Previous exam(s).

CLINICAL DATA: Screening.

EXAM:
DIGITAL SCREENING BILATERAL MAMMOGRAM WITH TOMOSYNTHESIS AND CAD
TECHNIQUE: Bilateral screening digital craniocaudal and mediolateral oblique
mammograms were obtained. Bilateral screening digital breast
tomosynthesis was performed. The images were evaluated with
computer-aided detection.

[R CC synth-2D (1 of 2)]
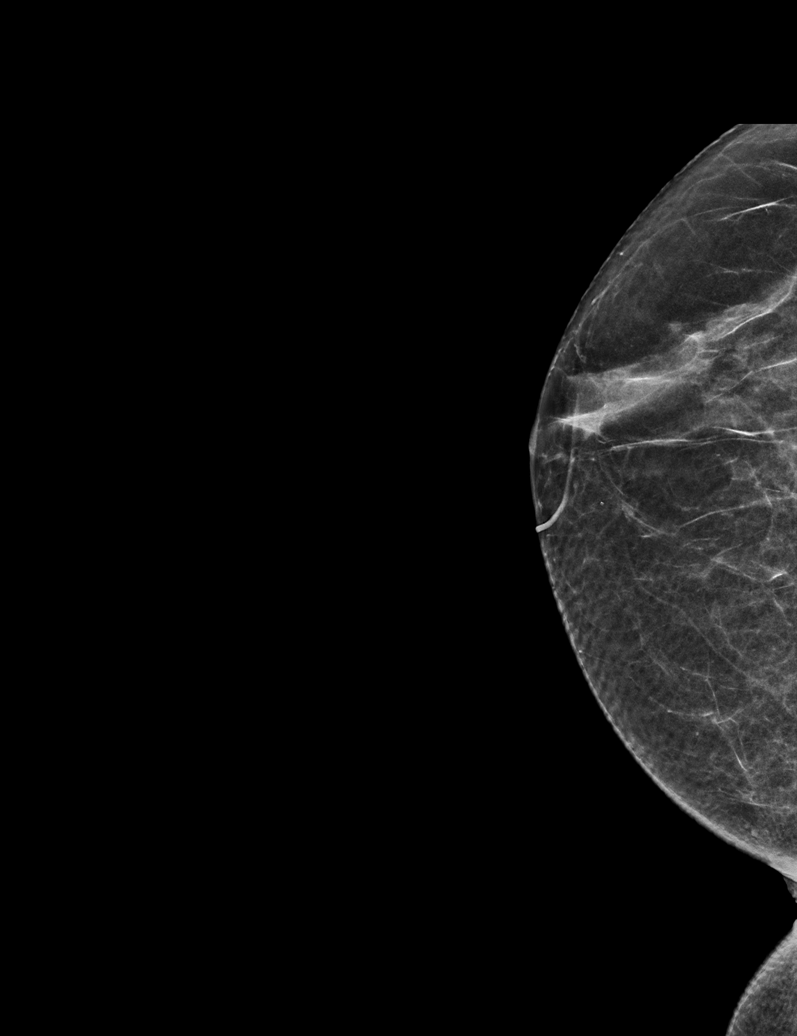

[R MLO synth-2D]
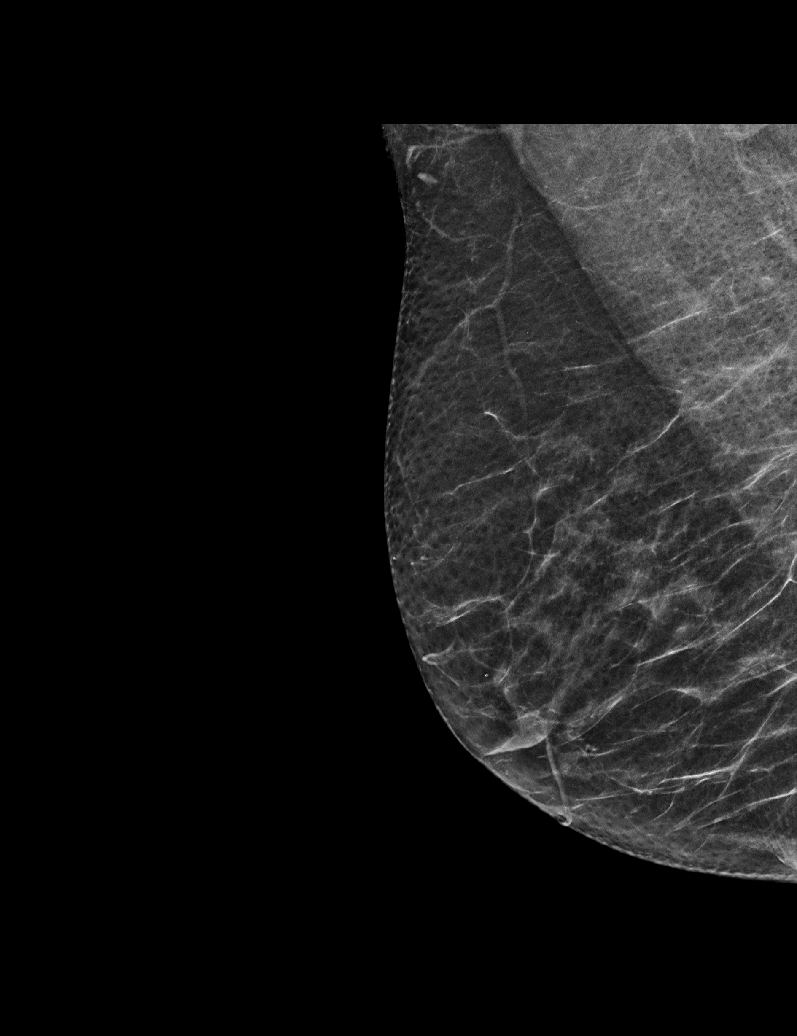

[L MLO synth-2D]
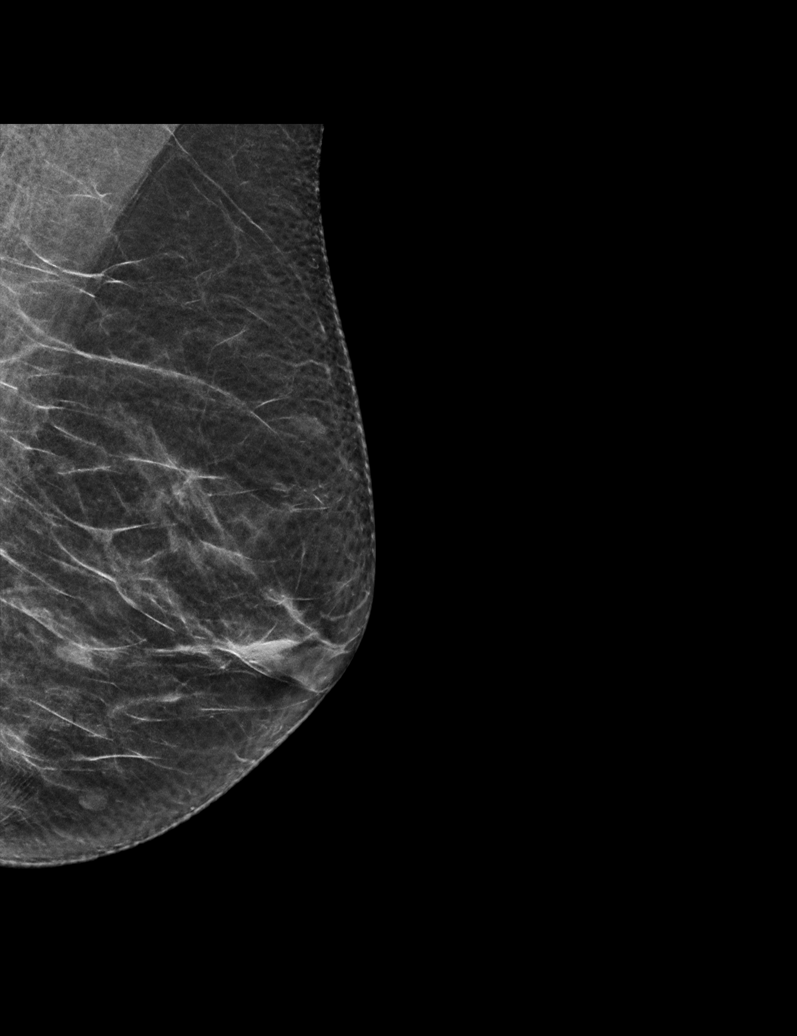

[L CC synth-2D]
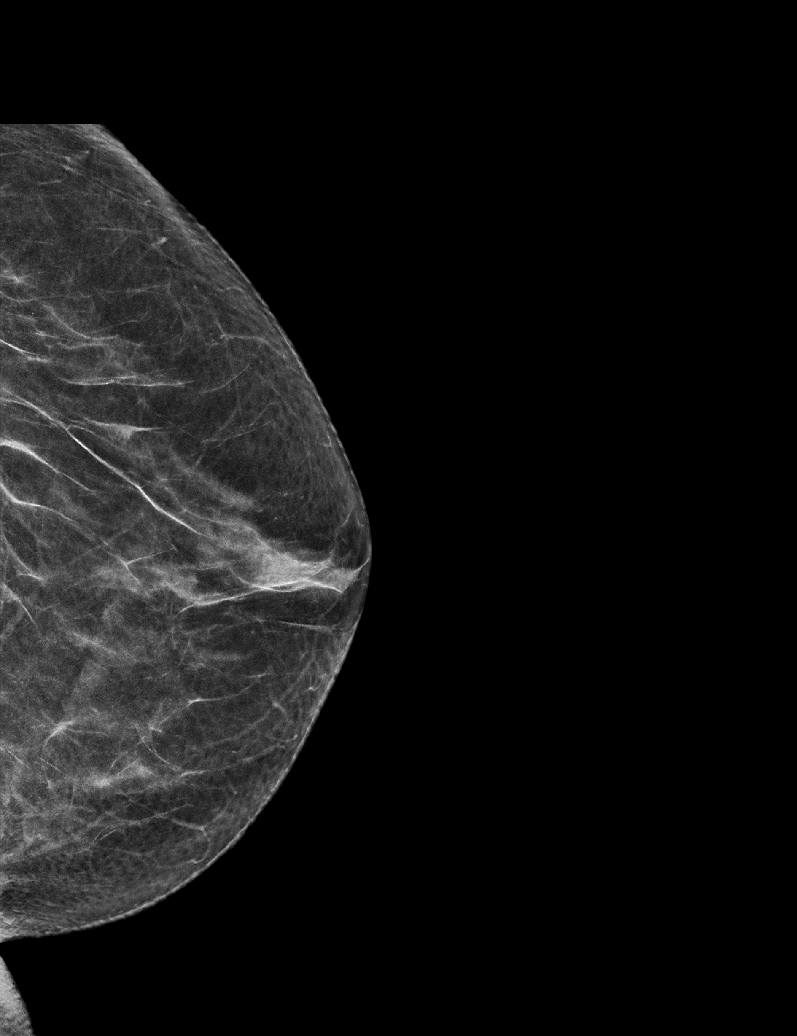

[R CC synth-2D (2 of 2)]
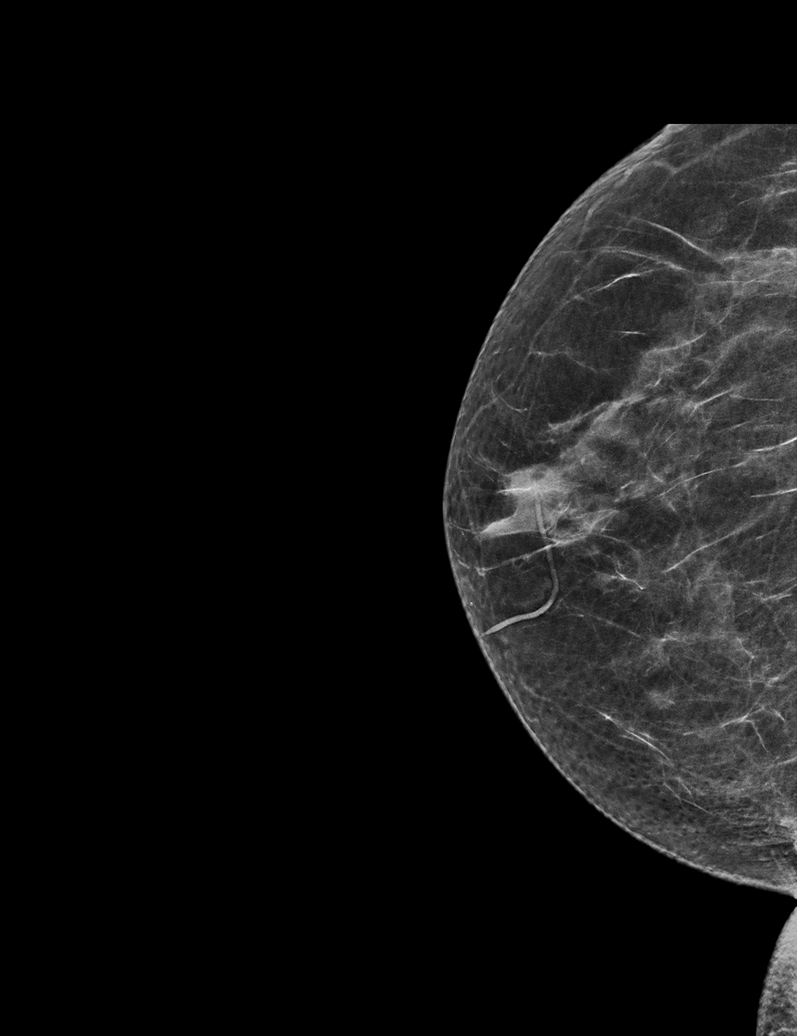

[L CC tomo · tomo slice 27/54.0]
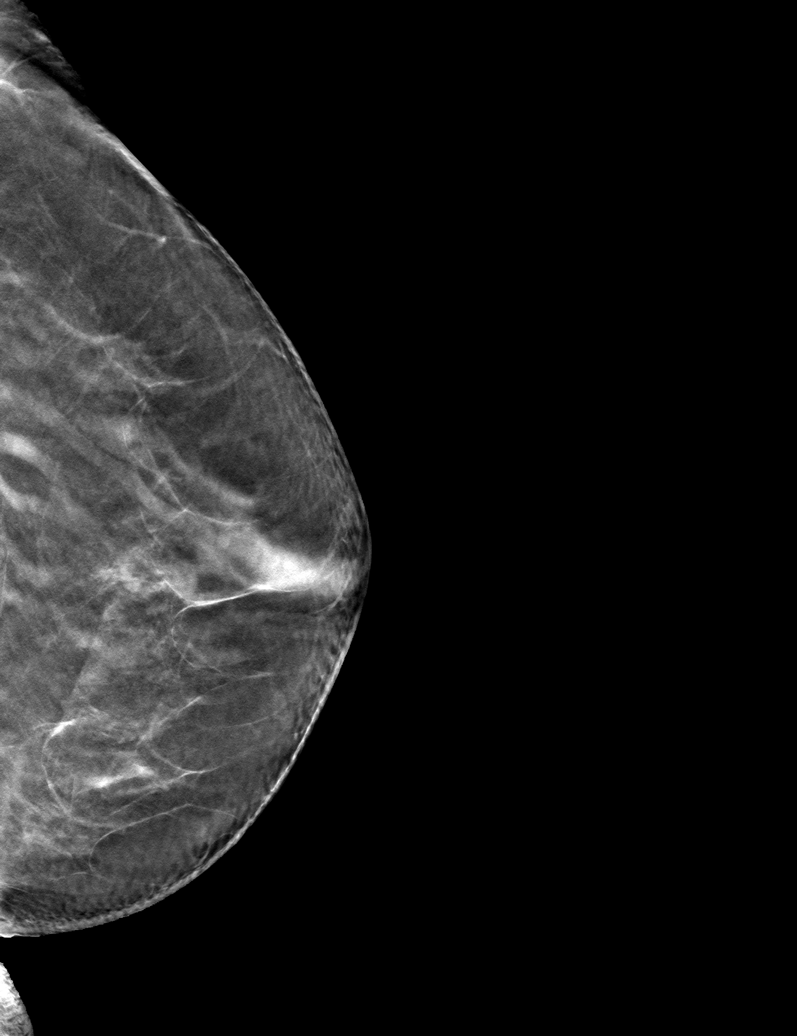

[6 of 30 positions shown; findings below may reference images not displayed]

ACR Breast Density Category b: There are scattered areas of
fibroglandular density.
FINDINGS: There are no findings suspicious for malignancy.
IMPRESSION: No mammographic evidence of malignancy. A result letter of this
screening mammogram will be mailed directly to the patient.

RECOMMENDATION:
Screening mammogram in one year. (Code:51-O-LD2)

BI-RADS CATEGORY  1: Negative.

## 2023-04-16 ENCOUNTER — Ambulatory Visit
Admission: RE | Admit: 2023-04-16 | Discharge: 2023-04-16 | Disposition: A | Discharge status: Home / Self Care | Payer: Medicare Other | Source: Ambulatory Visit | Attending: Family Medicine | Admitting: Family Medicine

## 2023-04-16 VITALS — BP 106/60 | HR 75 | Temp 97.6°F | Resp 18

## 2023-04-16 DIAGNOSIS — H5789 Other specified disorders of eye and adnexa: Secondary | ICD-10-CM | POA: Diagnosis not present

## 2023-04-16 NOTE — ED Triage Notes (Signed)
Patient with c/o feeling like something is in her right eye. Denies any vision loss or changes. States she has been using OTC eye drops with no relief.

## 2023-04-16 NOTE — ED Provider Notes (Signed)
[name]    CSN: [id number] Arrival date & time: 04/16/23  1053      History   Chief Complaint Chief Complaint  Patient presents with   Eye Problem    HPI Alice [name] is a 72 y.o. female. She reports feeling like "something is in my eye" for the last 3 days but doesn't recall a specific incident where something got in her eye. Has been working in the yard lately so she suspects something might have gotten in her eye then. Has been using systane for no relief.   Eye Problem   Past Medical History:  Diagnosis Date   Anorexia    HISTORY OF    Anxiety    Cancer (HCC)    basal cell carcinoma per right side under arm    DDD (degenerative disc disease), lumbar    Depression    GERD (gastroesophageal reflux disease)    H/O hiatal hernia    History of stomach ulcers    Hyperlipidemia    Kidney stone    Sciatica    getting prednisone inj/epidural injections   Tinnitus     Patient Active Problem List   Diagnosis Date Noted   Idiopathic peripheral neuropathy 11/18/2020   Pain in left foot 07/14/2019   Closed fracture of fifth metatarsal bone 07/08/2019   Menopausal syndrome 04/04/2018   Greater trochanteric bursitis of left hip 11/07/2015   Trochanteric bursitis of left hip 11/07/2015   Hydrosalpinx 09/19/2015   Spinal stenosis 06/09/2015   Esophageal reflux 07/23/2014   Dysphagia 07/23/2014   Musculoskeletal pain 09/22/2012   Special screening for malignant neoplasms, colon 09/22/2012   Atypical hyperplasia of breast 08/11/2012   IRRITABLE BOWEL SYNDROME 03/21/2009   Constipation 03/02/2009   ABDOMINAL BLOATING 03/02/2009   ABDOMINAL PAIN-EPIGASTRIC 03/02/2009   ABDOMINAL PAIN-MULTIPLE SITES 03/02/2009   COLONIC POLYPS 03/01/2009   DM 03/01/2009   HYPERCHOLESTEROLEMIA 03/01/2009   ANXIETY DEPRESSION 03/01/2009   HYPERTENSION 03/01/2009   ESOPHAGITIS 03/01/2009   GASTRITIS, CHRONIC 03/01/2009   NEPHROLITHIASIS 03/01/2009    Past  Surgical History:  Procedure Laterality Date   APPENDECTOMY  1959   BACK SURGERY     with rods and screws placed in lumbar area    BREAST BIOPSY Right    benign cyst   COLONOSCOPY  2015   dilation of esophagus and polypectomy   CYST EXCISION Right    foot   CYSTOSCOPY KIDNEY W/ URETERAL GUIDE WIRE     ELBOW SURGERY Left 2003   EXCISION/RELEASE BURSA HIP Left 11/07/2015   Procedure: LEFT HIP BURSECTOMY WITH GLUTEAL TENDON REPAIR;  Surgeon: Ollen Gross, MD;  Location: [facility];  Service: Orthopedics;  Laterality: Left;   EYE SURGERY Right 2012   right-pterigium   ROTATOR CUFF REPAIR Left 2003   TONSILLECTOMY  1976   UPPER GASTROINTESTINAL ENDOSCOPY  2015    OB History   No obstetric history on file.      Home Medications    Prior to Admission medications   Medication Sig Start Date End Date Taking? Authorizing Provider  clonazePAM (KLONOPIN) 1 MG tablet Take 1 mg by mouth at bedtime.    [provider]  Estradiol-Norethindrone Acet 0.5-0.1 MG tablet Activella 0.5 mg-0.1 mg tablet  Take 1 tablet every day by oral route for 90 days. 08/08/12   [provider]  gabapentin (NEURONTIN) 100 MG capsule Take 100 mg by mouth at bedtime. 10/05/15   [provider]  pantoprazole (PROTONIX) 40 MG tablet  Take 1 tablet (40 mg total) by mouth daily. 08/31/14   Louis Meckel, MD  sertraline (ZOLOFT) 50 MG tablet Take 50 mg by mouth at bedtime. Pt stated, "I only take a 1/2 tablet" 03/03/15   [provider]    Family History Family History  Problem Relation Age of Onset   Breast cancer Mother    Hyperlipidemia Mother        entire family   Hypertension Mother    Aneurysm Father        AAA   Stroke Father    Hypertension Sister    Breast cancer Paternal Grandmother    Hypertension Brother     Social History Social History   Tobacco Use   Smoking status: Never   Smokeless tobacco: Never  Vaping Use   Vaping Use: Never used  Substance Use  Topics   Alcohol use: No   Drug use: No     Allergies   Bactrim [sulfamethoxazole-trimethoprim], Penicillins, Propoxyphene hcl, Statins, and Sulfa antibiotics   Review of Systems Review of Systems   Physical Exam Triage Vital Signs ED Triage Vitals  Enc Vitals Group     BP 04/16/23 1110 106/60     Pulse Rate 04/16/23 1110 75     Resp 04/16/23 1110 18     Temp 04/16/23 1110 97.6 F (36.4 C)     Temp Source 04/16/23 1110 Oral     SpO2 04/16/23 1110 95 %     Weight --      Height --      Head Circumference --      Peak Flow --      Pain Score 04/16/23 1111 0     Pain Loc --      Pain Edu? --      Excl. in GC? --    No data found.  Updated Vital Signs BP 106/60 (BP Location: Left Arm)   Pulse 75   Temp 97.6 F (36.4 C) (Oral)   Resp 18   SpO2 95%   Visual Acuity Right Eye Distance:   Left Eye Distance:   Bilateral Distance:    Right Eye Near:   Left Eye Near:    Bilateral Near:     Physical Exam Eyes:     General: Lids are everted, no foreign bodies appreciated.        Right eye: No foreign body, discharge or hordeolum.     Conjunctiva/sclera:     Right eye: Right conjunctiva is not injected. No chemosis, exudate or hemorrhage.    Pupils:     Right eye: No corneal abrasion or fluorescein uptake.      UC Treatments / Results  Labs (all labs ordered are listed, but only abnormal results are displayed) Labs Reviewed - No data to display  EKG   Radiology No results found.  Procedures Procedures (including critical care time)  Medications Ordered in UC Medications - No data to display  Initial Impression / Assessment and Plan / UC Course  I have reviewed the triage vital signs and the nursing notes.  Pertinent labs & imaging results that were available during my care of the patient were reviewed by me and considered in my medical decision making (see chart for details).    Reassured no abrasion or foreign body in R eye. Encouraged  continued use of lubricating eye drops until she can f/u with her eye doctor.   Final Clinical Impressions(s) / UC Diagnoses   Final diagnoses:  Irritation of right eye     Discharge Instructions      Try lubricating eye drops like systane or artificial tears to help soothe your eye irritation. Definitely follow up with your eye doctor for your eye and vision concerns.    ED Prescriptions   None    PDMP not reviewed this encounter.   Cathlyn Parsons, NP 04/16/23 1145

## 2023-04-16 NOTE — Discharge Instructions (Signed)
Try lubricating eye drops like systane or artificial tears to help soothe your eye irritation. Definitely follow up with your eye doctor for your eye and vision concerns.

## 2023-04-18 DIAGNOSIS — H9113 Presbycusis, bilateral: Secondary | ICD-10-CM | POA: Insufficient documentation

## 2023-04-18 DIAGNOSIS — H903 Sensorineural hearing loss, bilateral: Secondary | ICD-10-CM | POA: Diagnosis not present

## 2023-05-08 DIAGNOSIS — K219 Gastro-esophageal reflux disease without esophagitis: Secondary | ICD-10-CM | POA: Diagnosis not present

## 2023-05-08 DIAGNOSIS — L719 Rosacea, unspecified: Secondary | ICD-10-CM | POA: Diagnosis not present

## 2023-05-08 DIAGNOSIS — N1831 Chronic kidney disease, stage 3a: Secondary | ICD-10-CM | POA: Diagnosis not present

## 2023-05-08 DIAGNOSIS — E7800 Pure hypercholesterolemia, unspecified: Secondary | ICD-10-CM | POA: Diagnosis not present

## 2023-05-08 DIAGNOSIS — G8929 Other chronic pain: Secondary | ICD-10-CM | POA: Diagnosis not present

## 2023-05-29 DIAGNOSIS — M5416 Radiculopathy, lumbar region: Secondary | ICD-10-CM | POA: Diagnosis not present

## 2023-06-05 ENCOUNTER — Ambulatory Visit
Admission: EM | Admit: 2023-06-05 | Discharge: 2023-06-05 | Disposition: A | Discharge status: Home / Self Care | Payer: Medicare Other | Attending: Family Medicine | Admitting: Family Medicine

## 2023-06-05 DIAGNOSIS — R300 Dysuria: Secondary | ICD-10-CM | POA: Insufficient documentation

## 2023-06-05 DIAGNOSIS — N3000 Acute cystitis without hematuria: Secondary | ICD-10-CM | POA: Diagnosis not present

## 2023-06-05 DIAGNOSIS — B370 Candidal stomatitis: Secondary | ICD-10-CM | POA: Diagnosis not present

## 2023-06-05 LAB — POCT URINALYSIS DIP (MANUAL ENTRY)
Bilirubin, UA: NEGATIVE
Glucose, UA: NEGATIVE mg/dL
Ketones, POC UA: NEGATIVE mg/dL
Nitrite, UA: POSITIVE — AB
Spec Grav, UA: >=1.03 — AB (ref 1.010–1.025)
Urobilinogen, UA: 0.2 E.U./dL
pH, UA: 6 (ref 5.0–8.0)

## 2023-06-05 MED ORDER — FLUCONAZOLE 150 MG PO TABS
150.0000 mg | ORAL_TABLET | ORAL | 0 refills | Status: DC | PRN
Start: 2023-06-05 — End: 2023-07-17

## 2023-06-05 MED ORDER — FLUCONAZOLE 150 MG PO TABS: 150.0000 mg | ORAL_TABLET | Freq: Once | ORAL | 0 refills | Status: DC

## 2023-06-05 MED ORDER — NYSTATIN 100000 UNIT/ML MT SUSP
5.0000 mL | Freq: Four times a day (QID) | OROMUCOSAL | 0 refills | Status: AC | PRN
Start: 2023-06-05 — End: ?

## 2023-06-05 MED ORDER — CEPHALEXIN 500 MG PO CAPS
500.0000 mg | ORAL_CAPSULE | Freq: Two times a day (BID) | ORAL | 0 refills | Status: AC
Start: 2023-06-05 — End: 2023-06-12

## 2023-06-05 NOTE — ED Provider Notes (Addendum)
[name]    CSN: [id number] Arrival date & time: 06/05/23  1115      History   Chief Complaint Chief Complaint  Patient presents with   UTI Symptoms    HPI Mersedes [name] is a 72 y.o. female.   HPI Alice [name] is a 72 y.o. female presents for evaluation of urinary frequency, urgency and dysuria x 2-3 days, with right lower flank pain. Patient denies fever, chills, or abnormal vaginal discharge or bleeding. Previous history of UTI and uncertain of urine pathology. No recent UTI within the past 30 days. Past Medical History:  Diagnosis Date   Anorexia    HISTORY OF    Anxiety    Cancer (HCC)    basal cell carcinoma per right side under arm    DDD (degenerative disc disease), lumbar    Depression    GERD (gastroesophageal reflux disease)    H/O hiatal hernia    History of stomach ulcers    Hyperlipidemia    Kidney stone    Sciatica    getting prednisone inj/epidural injections   Tinnitus     Patient Active Problem List   Diagnosis Date Noted   Presbycusis of both ears 04/18/2023   Pain in joint of right knee 08/18/2022   Low back pain 05/01/2022   Sacroiliac joint pain 12/12/2021   Prepatellar bursitis of left knee 10/19/2021   Idiopathic peripheral neuropathy 11/18/2020   Pain in left foot 07/14/2019   Closed fracture of fifth metatarsal bone 07/08/2019   Menopausal syndrome 04/04/2018   Greater trochanteric bursitis of left hip 11/07/2015   Trochanteric bursitis of left hip 11/07/2015   Hydrosalpinx 09/19/2015   Spinal stenosis 06/09/2015   Esophageal reflux 07/23/2014   Dysphagia 07/23/2014   Musculoskeletal pain 09/22/2012   Special screening for malignant neoplasms, colon 09/22/2012   Atypical hyperplasia of breast 08/11/2012   IRRITABLE BOWEL SYNDROME 03/21/2009   Constipation 03/02/2009   ABDOMINAL BLOATING 03/02/2009   ABDOMINAL PAIN-EPIGASTRIC 03/02/2009   ABDOMINAL PAIN-MULTIPLE SITES 03/02/2009   COLONIC POLYPS 03/01/2009    DM 03/01/2009   HYPERCHOLESTEROLEMIA 03/01/2009   ANXIETY DEPRESSION 03/01/2009   HYPERTENSION 03/01/2009   ESOPHAGITIS 03/01/2009   GASTRITIS, CHRONIC 03/01/2009   NEPHROLITHIASIS 03/01/2009    Past Surgical History:  Procedure Laterality Date   APPENDECTOMY  1959   BACK SURGERY     with rods and screws placed in lumbar area    BREAST BIOPSY Right    benign cyst   COLONOSCOPY  2015   dilation of esophagus and polypectomy   CYST EXCISION Right    foot   CYSTOSCOPY KIDNEY W/ URETERAL GUIDE WIRE     ELBOW SURGERY Left 2003   EXCISION/RELEASE BURSA HIP Left 11/07/2015   Procedure: LEFT HIP BURSECTOMY WITH GLUTEAL TENDON REPAIR;  Surgeon: Ollen Gross, MD;  Location: [facility];  Service: Orthopedics;  Laterality: Left;   EYE SURGERY Right 2012   right-pterigium   ROTATOR CUFF REPAIR Left 2003   TONSILLECTOMY  1976   UPPER GASTROINTESTINAL ENDOSCOPY  2015    OB History   No obstetric history on file.      Home Medications    Prior to Admission medications   Medication Sig Start Date End Date Taking? Authorizing Provider  acetaminophen (TYLENOL) 500 MG tablet 1 tablet as needed Orally every 6 hrs   Yes [provider]  busPIRone (BUSPAR) 10 MG tablet Take 5-10 mg by mouth 2 (two) times daily. 12/27/22  Yes [provider]  cephALEXin (KEFLEX) 500 MG capsule Take 1 capsule (500 mg total) by mouth 2 (two) times daily for 7 days. 06/05/23 06/12/23 Yes Bing Neighbors, NP  chlordiazePOXIDE (LIBRIUM) 10 MG capsule 1 capsule Orally once a day as needed for abdominal pain 03/30/21  Yes [provider]  clonazePAM (KLONOPIN) 1 MG tablet 1 tablet Orally Once a day for 30 days 01/11/23  Yes [provider]  clotrimazole (MYCELEX) 10 MG troche 1 troche while on immunosuppressive medicine Mouth/Throat Three times a day for 10 days 01/29/23  Yes [provider]  gabapentin (NEURONTIN) 100 MG capsule Take 100 mg by mouth at bedtime. 10/05/15  Yes  [provider]  nystatin (MYCOSTATIN) 100000 UNIT/ML suspension Use as directed 5 mLs (500,000 Units total) in the mouth or throat 4 (four) times daily as needed (for oral thrush.). 06/05/23  Yes Bing Neighbors, NP  pantoprazole (PROTONIX) 40 MG tablet Take 1 tablet (40 mg total) by mouth daily. 08/31/14  Yes Louis Meckel, MD  sertraline (ZOLOFT) 25 MG tablet Take 3 tablets by mouth daily.   Yes [provider]  Acetaminophen (TYLENOL) 325 MG CAPS Tylenol    [provider]  ciprofloxacin (CILOXAN) 0.3 % ophthalmic solution 1 2 DROPS IN AFFECTED EYE 4 TIMES/DAY X 5 DAYS    [provider]  Citalopram Hydrobromide (CELEXA PO)     [provider]  clonazePAM (KLONOPIN) 1 MG tablet Take 1 mg by mouth at bedtime.    [provider]  Estradiol-Norethindrone Acet 0.5-0.1 MG tablet Activella 0.5 mg-0.1 mg tablet  Take 1 tablet every day by oral route for 90 days. 08/08/12   [provider]  fluconazole (DIFLUCAN) 150 MG tablet Take 1 tablet (150 mg total) by mouth every three (3) days as needed (vaginal yeast prevention). Repeat if needed 06/05/23   Bing Neighbors, NP  fluocinonide (LIDEX) 0.05 % external solution     [provider]  fluticasone [facility] ALLERGY RELIEF) 50 MCG/ACT nasal spray 1 spray in each nostril Nasally Once a day for 90 days 04/02/17   [provider]  furosemide (LASIX) 20 MG tablet TAKE 1 TABLET ONCE A DAY FOR FLUID RETENTION ORALLY    [provider]  hydrOXYzine (ATARAX) 10 MG tablet Take 10 mg by mouth daily as needed.    [provider]  INFLUENZA VIRUS VACCINE LIVE NA ADM 0.7ML IM UTD    [provider]  meclizine (ANTIVERT) 12.5 MG tablet 1 tablet Orally Three times a day as needed for dizziness 11/30/10   [provider]  methocarbamol (ROBAXIN) 500 MG tablet 1 tablet Orally once a day at bedtime as needed for muscle spasm 09/29/21   [provider]  metroNIDAZOLE (METROGEL) 1 % gel 1 application to affected area Externally Once a day 04/02/17   [provider]  pantoprazole (PROTONIX) 40 MG tablet Take 1 tablet by mouth daily.    [provider]  phenylephrine (LITTLE REMEDIES DECONG NOSE) 0.125 % nasal drops Little Remedies 0.65 % nasal spray aerosol  PLACE 2 SPRAYS INTO BOTH NOSTRILS AS NEEDED FOR CONGESTION.    [provider]  Phenylephrine HCl (LITTLE REMEDIES DECONG NOSE NA) PLACE 2 SPRAYS INTO BOTH NOSTRILS AS NEEDED FOR CONGESTION.    [provider]  sertraline (ZOLOFT) 50 MG tablet Take 50 mg by mouth at bedtime. Pt stated, "I only take a 1/2 tablet" 03/03/15   [provider]  triamcinolone cream (  KENALOG) 0.1 % 1 application Externally to the affected skin areas Two times a day for 1-2 weeks 01/30/22   [provider]    Family History Family History  Problem Relation Age of Onset   Breast cancer Mother    Hyperlipidemia Mother        entire family   Hypertension Mother    Aneurysm Father        AAA   Stroke Father    Hypertension Sister    Breast cancer Paternal Grandmother    Hypertension Brother     Social History Social History   Tobacco Use   Smoking status: Never   Smokeless tobacco: Never  Vaping Use   Vaping Use: Never used  Substance Use Topics   Alcohol use: No   Drug use: No     Allergies   Neomycin, Penicillins, Pregabalin, Propoxyphene hcl, Pseudoephedrine, Sulfa antibiotics, Atorvastatin, Bactrim [sulfamethoxazole-trimethoprim], Bupropion, Colesevelam, Erythromycin, Ezetimibe, Pravastatin sodium, Ropinirole, Rosuvastatin, and Statins Review of Systems Review of Systems Pertinent negatives listed in HPI   Physical Exam Triage Vital Signs ED Triage Vitals  Enc Vitals Group     BP 06/05/23 1135 118/74     Pulse Rate 06/05/23 1135 70     Resp 06/05/23 1135 18     Temp 06/05/23 1135 98.1 F (36.7 C)     Temp Source 06/05/23 1135 Oral      SpO2 06/05/23 1135 98 %     Weight 06/05/23 1129 128 lb (58.1 kg)     Height 06/05/23 1129 5\' 5"  (1.651 m)     Head Circumference --      Peak Flow --      Pain Score 06/05/23 1129 8     Pain Loc --      Pain Edu? --      Excl. in GC? --    No data found.  Updated Vital Signs BP 118/74 (BP Location: Left Arm)   Pulse 70   Temp 98.1 F (36.7 C) (Oral)   Resp 18   Ht 5\' 5"  (1.651 m)   Wt 128 lb (58.1 kg)   SpO2 98%   BMI 21.30 kg/m   Visual Acuity Right Eye Distance:   Left Eye Distance:   Bilateral Distance:    Right Eye Near:   Left Eye Near:    Bilateral Near:     Physical Exam General appearance: Alert, well developed, well nourished, cooperative  Head: Normocephalic, without obvious abnormality, atraumatic Heart: Rate and rhythm normal.   Respiratory: Respirations even and unlabored, normal respiratory rate CVA:  Right CVA tenderness present  Extremities: No gross deformities Skin: Skin color, texture, turgor normal. No rashes seen  Psych: Appropriate mood and affect.   UC Treatments / Results  Labs (all labs ordered are listed, but only abnormal results are displayed) Labs Reviewed  POCT URINALYSIS DIP (MANUAL ENTRY) - Abnormal; Notable for the following components:      Result Value   Clarity, UA cloudy (*)    Spec Grav, UA >=1.030 (*)    Blood, UA moderate (*)    Protein Ur, POC trace (*)    Nitrite, UA Positive (*)    Leukocytes, UA Moderate (2+) (*)    All other components within normal limits  URINE CULTURE    EKG   Radiology No results found.  Procedures Procedures (including critical care time)  Medications Ordered in UC Medications - No data to display  Initial Impression / Assessment and Plan /  UC Course  I have reviewed the triage vital signs and the nursing notes.  Pertinent labs & imaging results that were available during my care of the patient were reviewed by me and considered in my medical decision making (see chart  for details).     UA abnormal and findings consistent with UTI. Empiric antibiotic treatment initiated with Kelfex as patient has multiple medication intolerances and allergies. Encouraged increase intake of water. Urine culture pending. Discussed signs and symptoms of worsening infection and indication to go to ER. Follow-up with PCP if symptoms do not completely resolve. Patient requested Magic Mouthwash for oral thrush reports this occurs routinely with antibiotic treatment. Also prescribed Diflucan as prophylaxis for vaginitis related to antibiotic treatment.  Final Clinical Impressions(s) / UC Diagnoses   Final diagnoses:  Dysuria  Acute cystitis without hematuria  Thrush, oral     Discharge Instructions      Urine culture pending. Start Cephalexin 500 mg twice daily x 7 days for urinary track infection.  Diflucan 150 mg take for yeast prevention. I have prescribed Nystatin oral rinse up to 4 times daily for thrush symptoms-you may purchase over the counter Mylanta use 10 ml with Nystatin to make magic mouthwash.  We will notify you if any changes in treatment are needed after your urine culture results.   ED Prescriptions     Medication Sig Dispense Auth. Provider   cephALEXin (KEFLEX) 500 MG capsule Take 1 capsule (500 mg total) by mouth 2 (two) times daily for 7 days. 14 capsule Bing Neighbors, NP   nystatin (MYCOSTATIN) 100000 UNIT/ML suspension Use as directed 5 mLs (500,000 Units total) in the mouth or throat 4 (four) times daily as needed (for oral thrush.). 60 mL Bing Neighbors, NP   fluconazole (DIFLUCAN) 150 MG tablet  (Status: Discontinued) Take 1 tablet (150 mg total) by mouth once for 1 dose. Repeat if needed 1 tablet Bing Neighbors, NP   fluconazole (DIFLUCAN) 150 MG tablet Take 1 tablet (150 mg total) by mouth every three (3) days as needed (vaginal yeast prevention). Repeat if needed 1 tablet Bing Neighbors, NP      PDMP not reviewed this  encounter.   Bing Neighbors, NP 06/09/23 1029    Bing Neighbors, NP 06/09/23 1032

## 2023-06-05 NOTE — Discharge Instructions (Addendum)
Urine culture pending. Start Cephalexin 500 mg twice daily x 7 days for urinary track infection.  Diflucan 150 mg take for yeast prevention. I have prescribed Nystatin oral rinse up to 4 times daily for thrush symptoms-you may purchase over the counter Mylanta use 10 ml with Nystatin to make magic mouthwash.  We will notify you if any changes in treatment are needed after your urine culture results.

## 2023-06-05 NOTE — ED Triage Notes (Signed)
Here for "flank pain R/L". "Unable to pee this morning". "I have Stage 3 Kidney disease as well". No dysuria. No fever.

## 2023-06-07 LAB — URINE CULTURE
Culture: >=100000 — AB
Special Requests: NORMAL

## 2023-06-15 DIAGNOSIS — M5416 Radiculopathy, lumbar region: Secondary | ICD-10-CM | POA: Diagnosis not present

## 2023-06-22 DIAGNOSIS — H60501 Unspecified acute noninfective otitis externa, right ear: Secondary | ICD-10-CM | POA: Diagnosis not present

## 2023-07-17 ENCOUNTER — Other Ambulatory Visit: Payer: Self-pay

## 2023-07-17 ENCOUNTER — Encounter: Payer: Self-pay | Admitting: *Deleted

## 2023-07-17 ENCOUNTER — Ambulatory Visit
Admission: EM | Admit: 2023-07-17 | Discharge: 2023-07-17 | Disposition: A | Discharge status: Home / Self Care | Payer: Medicare Other | Attending: Family Medicine | Admitting: Family Medicine

## 2023-07-17 DIAGNOSIS — N390 Urinary tract infection, site not specified: Secondary | ICD-10-CM | POA: Insufficient documentation

## 2023-07-17 DIAGNOSIS — R300 Dysuria: Secondary | ICD-10-CM | POA: Diagnosis not present

## 2023-07-17 LAB — POCT URINALYSIS DIP (MANUAL ENTRY)
Bilirubin, UA: NEGATIVE
Glucose, UA: NEGATIVE mg/dL
Ketones, POC UA: NEGATIVE mg/dL
Nitrite, UA: POSITIVE — AB
Protein Ur, POC: 100 mg/dL — AB
Spec Grav, UA: >=1.03 — AB (ref 1.010–1.025)
Urobilinogen, UA: 0.2 E.U./dL
pH, UA: 6 (ref 5.0–8.0)

## 2023-07-17 MED ORDER — CIPROFLOXACIN HCL 500 MG PO TABS: 500.0000 mg | ORAL_TABLET | Freq: Two times a day (BID) | ORAL | 0 refills | Status: AC

## 2023-07-17 MED ORDER — FLUCONAZOLE 150 MG PO TABS: 150.0000 mg | ORAL_TABLET | ORAL | 0 refills | Status: DC | PRN

## 2023-07-17 NOTE — ED Provider Notes (Signed)
[name]    CSN: [id number] Arrival date & time: 07/17/23  1546      History   Chief Complaint Chief Complaint  Patient presents with   Back Pain   Dysuria    HPI Alice [name] is a 72 y.o. female.   HPI Alice [name] is a 72 y.o. female presents for evaluation of low back pain and dysuria .  Patient routinely gets UTIs and reports that this feels similar to the last UTI she had in July.  Seen here on June 05, 2023 uulture was significant for patient denies fever, chills, or abnormal vaginal discharge or bleeding. Previous history of UTI and uncertain of urine pathology. No recent UTI within the past 30 days.  Past Medical History:  Diagnosis Date   Anorexia    HISTORY OF    Anxiety    Cancer (HCC)    basal cell carcinoma per right side under arm    DDD (degenerative disc disease), lumbar    Depression    GERD (gastroesophageal reflux disease)    H/O hiatal hernia    History of stomach ulcers    Hyperlipidemia    Kidney stone    Sciatica    getting prednisone inj/epidural injections   Tinnitus     Patient Active Problem List   Diagnosis Date Noted   Presbycusis of both ears 04/18/2023   Pain in joint of right knee 08/18/2022   Low back pain 05/01/2022   Sacroiliac joint pain 12/12/2021   Prepatellar bursitis of left knee 10/19/2021   Idiopathic peripheral neuropathy 11/18/2020   Pain in left foot 07/14/2019   Closed fracture of fifth metatarsal bone 07/08/2019   Menopausal syndrome 04/04/2018   Greater trochanteric bursitis of left hip 11/07/2015   Trochanteric bursitis of left hip 11/07/2015   Hydrosalpinx 09/19/2015   Spinal stenosis 06/09/2015   Esophageal reflux 07/23/2014   Dysphagia 07/23/2014   Musculoskeletal pain 09/22/2012   Special screening for malignant neoplasms, colon 09/22/2012   Atypical hyperplasia of breast 08/11/2012   IRRITABLE BOWEL SYNDROME 03/21/2009   Constipation 03/02/2009   ABDOMINAL BLOATING 03/02/2009    ABDOMINAL PAIN-EPIGASTRIC 03/02/2009   ABDOMINAL PAIN-MULTIPLE SITES 03/02/2009   COLONIC POLYPS 03/01/2009   DM 03/01/2009   HYPERCHOLESTEROLEMIA 03/01/2009   ANXIETY DEPRESSION 03/01/2009   HYPERTENSION 03/01/2009   ESOPHAGITIS 03/01/2009   GASTRITIS, CHRONIC 03/01/2009   NEPHROLITHIASIS 03/01/2009    Past Surgical History:  Procedure Laterality Date   APPENDECTOMY  1959   BACK SURGERY     with rods and screws placed in lumbar area    BREAST BIOPSY Right    benign cyst   COLONOSCOPY  2015   dilation of esophagus and polypectomy   CYST EXCISION Right    foot   CYSTOSCOPY KIDNEY W/ URETERAL GUIDE WIRE     ELBOW SURGERY Left 2003   EXCISION/RELEASE BURSA HIP Left 11/07/2015   Procedure: LEFT HIP BURSECTOMY WITH GLUTEAL TENDON REPAIR;  Surgeon: Ollen Gross, MD;  Location: [facility];  Service: Orthopedics;  Laterality: Left;   EYE SURGERY Right 2012   right-pterigium   ROTATOR CUFF REPAIR Left 2003   TONSILLECTOMY  1976   UPPER GASTROINTESTINAL ENDOSCOPY  2015    OB History   No obstetric history on file.      Home Medications    Prior to Admission medications   Medication Sig Start Date End Date Taking? Authorizing Provider  ciprofloxacin (CIPRO) 500 MG tablet Take 1 tablet (500 mg total)  by mouth 2 (two) times daily for 5 days. 07/17/23 07/22/23 Yes Bing Neighbors, NP  clonazePAM (KLONOPIN) 1 MG tablet 1 tablet Orally Once a day for 30 days 01/11/23  Yes [provider]  fluconazole (DIFLUCAN) 150 MG tablet Take 1 tablet (150 mg total) by mouth every three (3) days as needed. Repeat if needed 07/17/23  Yes Bing Neighbors, NP  fluticasone [facility] ALLERGY RELIEF) 50 MCG/ACT nasal spray 1 spray in each nostril Nasally Once a day for 90 days 04/02/17  Yes [provider]  gabapentin (NEURONTIN) 100 MG capsule Take 100 mg by mouth at bedtime. 10/05/15  Yes [provider]  methocarbamol (ROBAXIN) 500 MG tablet 1 tablet Orally once a day at bedtime  as needed for muscle spasm 09/29/21  Yes [provider]  pantoprazole (PROTONIX) 40 MG tablet Take 1 tablet by mouth daily.   Yes [provider]  sertraline (ZOLOFT) 25 MG tablet Take 3 tablets by mouth daily.   Yes [provider]  Acetaminophen (TYLENOL) 325 MG CAPS Tylenol    [provider]  acetaminophen (TYLENOL) 500 MG tablet 1 tablet as needed Orally every 6 hrs    [provider]  busPIRone (BUSPAR) 10 MG tablet Take 5-10 mg by mouth 2 (two) times daily. 12/27/22   [provider]  chlordiazePOXIDE (LIBRIUM) 10 MG capsule 1 capsule Orally once a day as needed for abdominal pain 03/30/21   [provider]  ciprofloxacin (CILOXAN) 0.3 % ophthalmic solution 1 2 DROPS IN AFFECTED EYE 4 TIMES/DAY X 5 DAYS    [provider]  Citalopram Hydrobromide (CELEXA PO)     [provider]  clonazePAM (KLONOPIN) 1 MG tablet Take 1 mg by mouth at bedtime.    [provider]  clotrimazole (MYCELEX) 10 MG troche 1 troche while on immunosuppressive medicine Mouth/Throat Three times a day for 10 days 01/29/23   [provider]  Estradiol-Norethindrone Acet 0.5-0.1 MG tablet Activella 0.5 mg-0.1 mg tablet  Take 1 tablet every day by oral route for 90 days. 08/08/12   [provider]  fluocinonide (LIDEX) 0.05 % external solution     [provider]  furosemide (LASIX) 20 MG tablet TAKE 1 TABLET ONCE A DAY FOR FLUID RETENTION ORALLY    [provider]  hydrOXYzine (ATARAX) 10 MG tablet Take 10 mg by mouth daily as needed.    [provider]  INFLUENZA VIRUS VACCINE LIVE NA ADM 0.7ML IM UTD    [provider]  meclizine (ANTIVERT) 12.5 MG tablet 1 tablet Orally Three times a day as needed for dizziness 11/30/10   [provider]  metroNIDAZOLE (METROGEL) 1 % gel 1 application to affected area Externally Once a day 04/02/17   [provider]  nystatin  (MYCOSTATIN) 100000 UNIT/ML suspension Use as directed 5 mLs (500,000 Units total) in the mouth or throat 4 (four) times daily as needed (for oral thrush.). 06/05/23   Bing Neighbors, NP  pantoprazole (PROTONIX) 40 MG tablet Take 1 tablet (40 mg total) by mouth daily. 08/31/14   Louis Meckel, MD  phenylephrine (LITTLE REMEDIES DECONG NOSE) 0.125 % nasal drops Little Remedies 0.65 % nasal spray aerosol  PLACE 2 SPRAYS INTO BOTH NOSTRILS AS NEEDED FOR CONGESTION.    [provider]  Phenylephrine HCl (LITTLE REMEDIES DECONG NOSE NA) PLACE 2 SPRAYS INTO BOTH NOSTRILS AS NEEDED FOR CONGESTION.    [provider]  sertraline (ZOLOFT) 50 MG tablet  Take 50 mg by mouth at bedtime. Pt stated, "I only take a 1/2 tablet" 03/03/15   [provider]  triamcinolone cream (KENALOG) 0.1 % 1 application Externally to the affected skin areas Two times a day for 1-2 weeks 01/30/22   [provider]    Family History Family History  Problem Relation Age of Onset   Breast cancer Mother    Hyperlipidemia Mother        entire family   Hypertension Mother    Aneurysm Father        AAA   Stroke Father    Hypertension Sister    Breast cancer Paternal Grandmother    Hypertension Brother     Social History Social History   Tobacco Use   Smoking status: Never   Smokeless tobacco: Never  Vaping Use   Vaping status: Never Used  Substance Use Topics   Alcohol use: No   Drug use: No     Allergies   Neomycin, Penicillins, Pregabalin, Propoxyphene hcl, Pseudoephedrine, Sulfa antibiotics, Atorvastatin, Bactrim [sulfamethoxazole-trimethoprim], Bupropion, Colesevelam, Erythromycin, Ezetimibe, Pravastatin sodium, Ropinirole, Rosuvastatin, and Statins   Review of Systems Review of Systems Pertinent negatives listed in HPI   Physical Exam Triage Vital Signs ED Triage Vitals  Encounter Vitals Group     BP 07/17/23 1556 (!) 144/84     Systolic BP Percentile --       Diastolic BP Percentile --      Pulse Rate 07/17/23 1555 71     Resp 07/17/23 1555 18     Temp 07/17/23 1555 98 F (36.7 C)     Temp Source 07/17/23 1555 Oral     SpO2 07/17/23 1555 98 %     Weight --      Height --      Head Circumference --      Peak Flow --      Pain Score 07/17/23 1555 3     Pain Loc --      Pain Education --      Exclude from Growth Chart --    No data found.  Updated Vital Signs BP (!) 144/84   Pulse 71   Temp 98 F (36.7 C) (Oral)   Resp 18   SpO2 98%   Visual Acuity Right Eye Distance:   Left Eye Distance:   Bilateral Distance:    Right Eye Near:   Left Eye Near:    Bilateral Near:     Physical Exam Vitals reviewed.  Constitutional:      Appearance: Normal appearance.  Eyes:     Extraocular Movements: Extraocular movements intact.     Pupils: Pupils are equal, round, and reactive to light.  Cardiovascular:     Rate and Rhythm: Normal rate and regular rhythm.  Pulmonary:     Effort: Pulmonary effort is normal.     Breath sounds: Normal breath sounds.  Abdominal:     Tenderness: There is no right CVA tenderness or left CVA tenderness.  Musculoskeletal:     Cervical back: Normal range of motion and neck supple.  Skin:    General: Skin is warm and dry.  Neurological:     General: No focal deficit present.     Mental Status: She is alert and oriented to person, place, and time.      UC Treatments / Results  Labs (all labs ordered are listed, but only abnormal results are displayed) Labs Reviewed  POCT URINALYSIS DIP (MANUAL ENTRY) - Abnormal; Notable  for the following components:      Result Value   Clarity, UA cloudy (*)    Spec Grav, UA >=1.030 (*)    Blood, UA large (*)    Protein Ur, POC =100 (*)    Nitrite, UA Positive (*)    Leukocytes, UA Trace (*)    All other components within normal limits  URINE CULTURE    EKG   Radiology No results found.  Procedures Procedures (including critical care  time)  Medications Ordered in UC Medications - No data to display  Initial Impression / Assessment and Plan / UC Course  I have reviewed the triage vital signs and the nursing notes.  Pertinent labs & imaging results that were available during my care of the patient were reviewed by me and considered in my medical decision making (see chart for details).    Recurrent UTIs, UA appears to be unchanged from previous UTI however a trace of leuks are only present.  Will cover with a different antibiotic and patient has been treated the same antibiotic the last few UTIs she has had.  Encourage patient also to follow-up with PCP to discuss referral to urology given history of recurrent UTIs and urinary retention symptoms. Final Clinical Impressions(s) / UC Diagnoses   Final diagnoses:  Dysuria  Acute UTI (urinary tract infection)     Discharge Instructions      Urine culture will result within 2 to 3 days.  Our office will contact you if any changes in treatment are warranted after your urine culture results.  Complete entire course of medication.  Continue to force fluids.  Discussed with your primary care provider a referral to urology for further workup and evaluation of recurrent UTIs and bladder retention.   ED Prescriptions     Medication Sig Dispense Auth. Provider   ciprofloxacin (CIPRO) 500 MG tablet Take 1 tablet (500 mg total) by mouth 2 (two) times daily for 5 days. 10 tablet Bing Neighbors, NP   fluconazole (DIFLUCAN) 150 MG tablet Take 1 tablet (150 mg total) by mouth every three (3) days as needed. Repeat if needed 2 tablet Bing Neighbors, NP      PDMP not reviewed this encounter.   Bing Neighbors, NP 07/23/23 1104

## 2023-07-17 NOTE — ED Triage Notes (Signed)
C/O low back pain, malodorous urine, dysuria onset approx 3 days ago. Denies fevers.

## 2023-07-17 NOTE — Discharge Instructions (Signed)
Urine culture will result within 2 to 3 days.  Our office will contact you if any changes in treatment are warranted after your urine culture results.  Complete entire course of medication.  Continue to force fluids.  Discussed with your primary care provider a referral to urology for further workup and evaluation of recurrent UTIs and bladder retention.

## 2023-07-19 LAB — URINE CULTURE: Special Requests: NORMAL

## 2023-07-23 ENCOUNTER — Other Ambulatory Visit: Payer: Self-pay | Admitting: Family Medicine

## 2023-07-23 DIAGNOSIS — Z1231 Encounter for screening mammogram for malignant neoplasm of breast: Secondary | ICD-10-CM

## 2023-10-25 ENCOUNTER — Ambulatory Visit
Admission: RE | Admit: 2023-10-25 | Discharge: 2023-10-25 | Disposition: A | Discharge status: Home / Self Care | Payer: Medicare Other | Source: Ambulatory Visit | Attending: Family Medicine | Admitting: Family Medicine

## 2023-10-25 DIAGNOSIS — Z1231 Encounter for screening mammogram for malignant neoplasm of breast: Secondary | ICD-10-CM | POA: Diagnosis not present

## 2023-10-29 ENCOUNTER — Other Ambulatory Visit: Payer: Self-pay | Admitting: Family Medicine

## 2023-10-29 DIAGNOSIS — R928 Other abnormal and inconclusive findings on diagnostic imaging of breast: Secondary | ICD-10-CM

## 2023-11-06 DIAGNOSIS — L923 Foreign body granuloma of the skin and subcutaneous tissue: Secondary | ICD-10-CM | POA: Diagnosis not present

## 2023-11-06 DIAGNOSIS — L218 Other seborrheic dermatitis: Secondary | ICD-10-CM | POA: Diagnosis not present

## 2023-11-14 DIAGNOSIS — Z23 Encounter for immunization: Secondary | ICD-10-CM | POA: Diagnosis not present

## 2023-11-14 DIAGNOSIS — E7800 Pure hypercholesterolemia, unspecified: Secondary | ICD-10-CM | POA: Diagnosis not present

## 2023-11-14 DIAGNOSIS — L719 Rosacea, unspecified: Secondary | ICD-10-CM | POA: Diagnosis not present

## 2023-11-14 DIAGNOSIS — K219 Gastro-esophageal reflux disease without esophagitis: Secondary | ICD-10-CM | POA: Diagnosis not present

## 2023-11-14 DIAGNOSIS — R946 Abnormal results of thyroid function studies: Secondary | ICD-10-CM | POA: Diagnosis not present

## 2023-11-14 DIAGNOSIS — N1831 Chronic kidney disease, stage 3a: Secondary | ICD-10-CM | POA: Diagnosis not present

## 2023-11-14 DIAGNOSIS — G8929 Other chronic pain: Secondary | ICD-10-CM | POA: Diagnosis not present

## 2023-11-22 ENCOUNTER — Other Ambulatory Visit: Payer: Medicare Other

## 2023-11-22 ENCOUNTER — Inpatient Hospital Stay: Admission: RE | Admit: 2023-11-22 | Payer: Medicare Other | Source: Ambulatory Visit

## 2023-12-05 DIAGNOSIS — B370 Candidal stomatitis: Secondary | ICD-10-CM | POA: Diagnosis not present

## 2023-12-05 DIAGNOSIS — N399 Disorder of urinary system, unspecified: Secondary | ICD-10-CM | POA: Diagnosis not present

## 2023-12-13 ENCOUNTER — Ambulatory Visit
Admission: RE | Admit: 2023-12-13 | Discharge: 2023-12-13 | Disposition: A | Discharge status: Home / Self Care | Payer: Medicare Other | Source: Ambulatory Visit | Attending: Family Medicine | Admitting: Family Medicine

## 2023-12-13 ENCOUNTER — Ambulatory Visit: Payer: Medicare Other

## 2023-12-13 DIAGNOSIS — R928 Other abnormal and inconclusive findings on diagnostic imaging of breast: Secondary | ICD-10-CM

## 2024-01-30 DIAGNOSIS — L218 Other seborrheic dermatitis: Secondary | ICD-10-CM | POA: Diagnosis not present

## 2024-01-30 DIAGNOSIS — D492 Neoplasm of unspecified behavior of bone, soft tissue, and skin: Secondary | ICD-10-CM | POA: Diagnosis not present

## 2024-01-30 DIAGNOSIS — L821 Other seborrheic keratosis: Secondary | ICD-10-CM | POA: Diagnosis not present

## 2024-02-20 DIAGNOSIS — G8929 Other chronic pain: Secondary | ICD-10-CM | POA: Diagnosis not present

## 2024-02-20 DIAGNOSIS — R946 Abnormal results of thyroid function studies: Secondary | ICD-10-CM | POA: Diagnosis not present

## 2024-02-20 DIAGNOSIS — L719 Rosacea, unspecified: Secondary | ICD-10-CM | POA: Diagnosis not present

## 2024-02-20 DIAGNOSIS — K589 Irritable bowel syndrome without diarrhea: Secondary | ICD-10-CM | POA: Diagnosis not present

## 2024-02-20 DIAGNOSIS — N1831 Chronic kidney disease, stage 3a: Secondary | ICD-10-CM | POA: Diagnosis not present

## 2024-02-20 DIAGNOSIS — E7800 Pure hypercholesterolemia, unspecified: Secondary | ICD-10-CM | POA: Diagnosis not present

## 2024-02-20 DIAGNOSIS — K219 Gastro-esophageal reflux disease without esophagitis: Secondary | ICD-10-CM | POA: Diagnosis not present

## 2024-02-28 DIAGNOSIS — R1011 Right upper quadrant pain: Secondary | ICD-10-CM | POA: Diagnosis not present

## 2024-03-10 ENCOUNTER — Other Ambulatory Visit: Payer: Self-pay | Admitting: Surgery

## 2024-03-10 ENCOUNTER — Ambulatory Visit
Admission: RE | Admit: 2024-03-10 | Discharge: 2024-03-10 | Disposition: A | Discharge status: Home / Self Care | Source: Ambulatory Visit | Attending: Surgery | Admitting: Surgery

## 2024-03-10 DIAGNOSIS — R1011 Right upper quadrant pain: Secondary | ICD-10-CM | POA: Diagnosis not present

## 2024-03-11 ENCOUNTER — Other Ambulatory Visit: Payer: Self-pay | Admitting: Surgery

## 2024-03-11 DIAGNOSIS — R1011 Right upper quadrant pain: Secondary | ICD-10-CM

## 2024-03-12 ENCOUNTER — Ambulatory Visit
Admission: RE | Admit: 2024-03-12 | Discharge: 2024-03-12 | Disposition: A | Discharge status: Home / Self Care | Source: Ambulatory Visit | Attending: Surgery | Admitting: Surgery

## 2024-03-12 DIAGNOSIS — R1011 Right upper quadrant pain: Secondary | ICD-10-CM

## 2024-03-12 DIAGNOSIS — K760 Fatty (change of) liver, not elsewhere classified: Secondary | ICD-10-CM | POA: Diagnosis not present

## 2024-03-19 ENCOUNTER — Other Ambulatory Visit

## 2024-04-29 DIAGNOSIS — G2581 Restless legs syndrome: Secondary | ICD-10-CM | POA: Insufficient documentation

## 2024-04-29 DIAGNOSIS — M549 Dorsalgia, unspecified: Secondary | ICD-10-CM | POA: Insufficient documentation

## 2024-04-29 DIAGNOSIS — F3341 Major depressive disorder, recurrent, in partial remission: Secondary | ICD-10-CM | POA: Insufficient documentation

## 2024-04-29 DIAGNOSIS — F419 Anxiety disorder, unspecified: Secondary | ICD-10-CM | POA: Insufficient documentation

## 2024-04-29 DIAGNOSIS — M545 Low back pain, unspecified: Secondary | ICD-10-CM | POA: Insufficient documentation

## 2024-04-29 DIAGNOSIS — L719 Rosacea, unspecified: Secondary | ICD-10-CM | POA: Insufficient documentation

## 2024-04-29 DIAGNOSIS — F5101 Primary insomnia: Secondary | ICD-10-CM | POA: Insufficient documentation

## 2024-04-29 DIAGNOSIS — E7800 Pure hypercholesterolemia, unspecified: Secondary | ICD-10-CM | POA: Insufficient documentation

## 2024-04-29 DIAGNOSIS — N183 Chronic kidney disease, stage 3 unspecified: Secondary | ICD-10-CM | POA: Insufficient documentation

## 2024-04-29 DIAGNOSIS — N1831 Chronic kidney disease, stage 3a: Secondary | ICD-10-CM | POA: Insufficient documentation

## 2024-04-29 DIAGNOSIS — K219 Gastro-esophageal reflux disease without esophagitis: Secondary | ICD-10-CM | POA: Insufficient documentation

## 2024-04-29 DIAGNOSIS — G8929 Other chronic pain: Secondary | ICD-10-CM | POA: Insufficient documentation

## 2024-04-29 DIAGNOSIS — E2839 Other primary ovarian failure: Secondary | ICD-10-CM | POA: Insufficient documentation

## 2024-04-29 DIAGNOSIS — J301 Allergic rhinitis due to pollen: Secondary | ICD-10-CM | POA: Insufficient documentation

## 2024-04-30 DIAGNOSIS — J340 Abscess, furuncle and carbuncle of nose: Secondary | ICD-10-CM | POA: Diagnosis not present

## 2024-05-26 DIAGNOSIS — L719 Rosacea, unspecified: Secondary | ICD-10-CM | POA: Diagnosis not present

## 2024-05-26 DIAGNOSIS — K589 Irritable bowel syndrome without diarrhea: Secondary | ICD-10-CM | POA: Diagnosis not present

## 2024-05-26 DIAGNOSIS — R1011 Right upper quadrant pain: Secondary | ICD-10-CM | POA: Diagnosis not present

## 2024-05-26 DIAGNOSIS — G8929 Other chronic pain: Secondary | ICD-10-CM | POA: Diagnosis not present

## 2024-05-26 DIAGNOSIS — E7800 Pure hypercholesterolemia, unspecified: Secondary | ICD-10-CM | POA: Diagnosis not present

## 2024-05-26 DIAGNOSIS — N1831 Chronic kidney disease, stage 3a: Secondary | ICD-10-CM | POA: Diagnosis not present

## 2024-05-26 DIAGNOSIS — Z0000 Encounter for general adult medical examination without abnormal findings: Secondary | ICD-10-CM | POA: Diagnosis not present

## 2024-05-26 DIAGNOSIS — K219 Gastro-esophageal reflux disease without esophagitis: Secondary | ICD-10-CM | POA: Diagnosis not present

## 2024-05-26 DIAGNOSIS — R946 Abnormal results of thyroid function studies: Secondary | ICD-10-CM | POA: Diagnosis not present

## 2024-06-26 ENCOUNTER — Encounter: Payer: Self-pay | Admitting: Gastroenterology

## 2024-07-16 DIAGNOSIS — Z974 Presence of external hearing-aid: Secondary | ICD-10-CM | POA: Diagnosis not present

## 2024-07-16 DIAGNOSIS — H9201 Otalgia, right ear: Secondary | ICD-10-CM | POA: Diagnosis not present

## 2024-08-03 DIAGNOSIS — J019 Acute sinusitis, unspecified: Secondary | ICD-10-CM | POA: Diagnosis not present

## 2024-08-03 DIAGNOSIS — R051 Acute cough: Secondary | ICD-10-CM | POA: Diagnosis not present

## 2024-08-03 DIAGNOSIS — B349 Viral infection, unspecified: Secondary | ICD-10-CM | POA: Diagnosis not present

## 2024-08-18 ENCOUNTER — Encounter: Payer: Self-pay | Admitting: Emergency Medicine

## 2024-08-18 ENCOUNTER — Ambulatory Visit
Admission: EM | Admit: 2024-08-18 | Discharge: 2024-08-18 | Disposition: A | Discharge status: Home / Self Care | Attending: Family Medicine | Admitting: Family Medicine

## 2024-08-18 DIAGNOSIS — J069 Acute upper respiratory infection, unspecified: Secondary | ICD-10-CM

## 2024-08-18 DIAGNOSIS — R051 Acute cough: Secondary | ICD-10-CM

## 2024-08-18 LAB — POC COVID19/FLU A&B COMBO
Covid Antigen, POC: NEGATIVE
Influenza A Antigen, POC: NEGATIVE
Influenza B Antigen, POC: NEGATIVE

## 2024-08-18 MED ORDER — BENZONATATE 200 MG PO CAPS: 200.0000 mg | ORAL_CAPSULE | Freq: Three times a day (TID) | ORAL | 0 refills | Status: DC | PRN

## 2024-08-18 NOTE — ED Provider Notes (Addendum)
 [name]    CSN: [id number] Arrival date & time: 08/18/24  0955      History   Chief Complaint Chief Complaint  Patient presents with   Cough   Nasal Congestion   Headache    HPI Alice [name] is a 73 y.o. female.    Cough Associated symptoms: headaches and rhinorrhea   Headache Associated symptoms: congestion and cough    Patient is here for URI symptoms x 4 days.  Having dry cough, headache and nasal congestion.  About 1 month ago she was given an abx (clindamycin) for acute sinusitis, and then 2 weeks ago given doxy for continued infection but could not finish that due to stomach issues.  The sinus drainage is improved, but now is just the cough.  She is using otc medications without much help.  No wheezing or sob noted.           Past Medical History:  Diagnosis Date   Anorexia    HISTORY OF    Anxiety    Cancer (HCC)    basal cell carcinoma per right side under arm    DDD (degenerative disc disease), lumbar    Depression    GERD (gastroesophageal reflux disease)    H/O hiatal hernia    History of stomach ulcers    Hyperlipidemia    Kidney stone    Sciatica    getting prednisone  inj/epidural injections   Tinnitus     Patient Active Problem List   Diagnosis Date Noted   Estrogen deficiency 04/29/2024   Anxiety disorder 04/29/2024   Other chronic pain 04/29/2024   Primary insomnia 04/29/2024   Recurrent major depressive disorder, in partial remission (HCC) 04/29/2024   Rosacea 04/29/2024   RLS (restless legs syndrome) 04/29/2024   Seasonal allergic rhinitis due to pollen 04/29/2024   Stage 3 chronic kidney disease (HCC) 04/29/2024   Stage 3a chronic kidney disease (CKD) (HCC) 04/29/2024   Acute bilateral low back pain without sciatica 04/29/2024   Back pain 04/29/2024   Gastroesophageal reflux disease without esophagitis 04/29/2024   Hypercholesteremia 04/29/2024   Lumbar radiculopathy 05/29/2023   Presbycusis of both  ears 04/18/2023   Pain in joint of right knee 08/18/2022   Arthralgia of right knee 08/18/2022   Low back pain 05/01/2022   Sacroiliac joint pain 12/12/2021   Prepatellar bursitis of left knee 10/19/2021   Idiopathic peripheral neuropathy 11/18/2020   Pain in left foot 07/14/2019   Closed fracture of fifth metatarsal bone 07/08/2019   Menopausal syndrome 04/04/2018   Greater trochanteric bursitis of left hip 11/07/2015   Trochanteric bursitis of left hip 11/07/2015   Hydrosalpinx 09/19/2015   Spinal stenosis 06/09/2015   Esophageal reflux 07/23/2014   Dysphagia 07/23/2014   Musculoskeletal pain 09/22/2012   Special screening for malignant neoplasms, colon 09/22/2012   Atypical hyperplasia of breast 08/11/2012   IRRITABLE BOWEL SYNDROME 03/21/2009   Constipation 03/02/2009   ABDOMINAL BLOATING 03/02/2009   ABDOMINAL PAIN-EPIGASTRIC 03/02/2009   ABDOMINAL PAIN-MULTIPLE SITES 03/02/2009   COLONIC POLYPS 03/01/2009   DM 03/01/2009   HYPERCHOLESTEROLEMIA 03/01/2009   ANXIETY DEPRESSION 03/01/2009   HYPERTENSION 03/01/2009   ESOPHAGITIS 03/01/2009   GASTRITIS, CHRONIC 03/01/2009   NEPHROLITHIASIS 03/01/2009    Past Surgical History:  Procedure Laterality Date   APPENDECTOMY  1959   BACK SURGERY     with rods and screws placed in lumbar area    BREAST BIOPSY Right    benign cyst   COLONOSCOPY  2015  dilation of esophagus and polypectomy   CYST EXCISION Right    foot   CYSTOSCOPY KIDNEY W/ URETERAL GUIDE WIRE     ELBOW SURGERY Left 2003   EXCISION/RELEASE BURSA HIP Left 11/07/2015   Procedure: LEFT HIP BURSECTOMY WITH GLUTEAL TENDON REPAIR;  Surgeon: Dempsey Moan, MD;  Location: [facility];  Service: Orthopedics;  Laterality: Left;   EYE SURGERY Right 2012   right-pterigium   ROTATOR CUFF REPAIR Left 2003   TONSILLECTOMY  1976   UPPER GASTROINTESTINAL ENDOSCOPY  2015    OB History   No obstetric history on file.      Home Medications    Prior to Admission  medications   Medication Sig Start Date End Date Taking? Authorizing Provider  clindamycin (CLEOCIN) 150 MG capsule 3 capsules Orally every 8 hrs; Duration: 5 day(s) 04/30/24  Yes [provider]  doxycycline (VIBRA-TABS) 100 MG tablet 1 tablet Orally twice a day; Duration: 10 days 08/03/24  Yes [provider]  Acetaminophen  (TYLENOL ) 325 MG CAPS Tylenol     [provider]  acetaminophen  (TYLENOL ) 500 MG tablet 1 tablet as needed Orally every 6 hrs    [provider]  busPIRone (BUSPAR) 10 MG tablet Take 5-10 mg by mouth 2 (two) times daily. 12/27/22   [provider]  chlordiazePOXIDE  (LIBRIUM ) 10 MG capsule 1 capsule Orally once a day as needed for abdominal pain 03/30/21   [provider]  ciprofloxacin  (CILOXAN ) 0.3 % ophthalmic solution 1 2 DROPS IN AFFECTED EYE 4 TIMES/DAY X 5 DAYS    [provider]  Citalopram Hydrobromide (CELEXA PO)     [provider]  clonazePAM  (KLONOPIN ) 1 MG tablet Take 1 mg by mouth at bedtime.    [provider]  clonazePAM  (KLONOPIN ) 1 MG tablet 1 tablet Orally Once a day for 30 days 01/11/23   [provider]  clotrimazole (MYCELEX) 10 MG troche 1 troche while on immunosuppressive medicine Mouth/Throat Three times a day for 10 days 01/29/23   [provider]  Estradiol-Norethindrone Acet 0.5-0.1 MG tablet Activella 0.5 mg-0.1 mg tablet  Take 1 tablet every day by oral route for 90 days. 08/08/12   [provider]  fluconazole  (DIFLUCAN ) 150 MG tablet Take 1 tablet (150 mg total) by mouth every three (3) days as needed. Repeat if needed 07/17/23   Arloa Suzen RAMAN, NP  fluocinonide (LIDEX) 0.05 % external solution     [provider]  fluticasone [facility] ALLERGY RELIEF) 50 MCG/ACT nasal spray 1 spray in each nostril Nasally Once a day for 90 days 04/02/17   [provider]  furosemide (LASIX) 20 MG tablet TAKE 1 TABLET ONCE A DAY FOR FLUID RETENTION  ORALLY    [provider]  gabapentin  (NEURONTIN ) 100 MG capsule Take 100 mg by mouth at bedtime. 10/05/15   [provider]  hydrOXYzine (ATARAX) 10 MG tablet Take 10 mg by mouth daily as needed.    [provider]  INFLUENZA VIRUS VACCINE LIVE NA ADM 0.7ML IM UTD    [provider]  meclizine (ANTIVERT) 12.5 MG tablet 1 tablet Orally Three times a day as needed for dizziness 11/30/10   [provider]  methocarbamol  (ROBAXIN ) 500 MG tablet 1 tablet Orally once a day at bedtime as needed for muscle spasm 09/29/21   [provider]  metroNIDAZOLE (METROGEL) 1 % gel 1 application to affected area Externally Once a day 04/02/17   [provider]  mupirocin  ointment (BACTROBAN )  2 % 1 Application.    [provider]  nystatin  (MYCOSTATIN ) 100000 UNIT/ML suspension Use as directed 5 mLs (500,000 Units total) in the mouth or throat 4 (four) times daily as needed (for oral thrush.). 06/05/23   Arloa Suzen RAMAN, NP  pantoprazole  (PROTONIX ) 40 MG tablet Take 1 tablet (40 mg total) by mouth daily. 08/31/14   Debrah Lamar BIRCH, MD  pantoprazole  (PROTONIX ) 40 MG tablet Take 1 tablet by mouth daily.    [provider]  phenylephrine  (LITTLE REMEDIES DECONG NOSE) 0.125 % nasal drops Little Remedies 0.65 % nasal spray aerosol  PLACE 2 SPRAYS INTO BOTH NOSTRILS AS NEEDED FOR CONGESTION.    [provider]  Phenylephrine  HCl (LITTLE REMEDIES DECONG NOSE NA) PLACE 2 SPRAYS INTO BOTH NOSTRILS AS NEEDED FOR CONGESTION.    [provider]  sertraline  (ZOLOFT ) 25 MG tablet Take 3 tablets by mouth daily.    [provider]  sertraline  (ZOLOFT ) 50 MG tablet Take 50 mg by mouth at bedtime. Pt stated, I only take a 1/2 tablet 03/03/15   [provider]  triamcinolone  cream (KENALOG ) 0.1 % 1 application Externally to the affected skin areas Two times a day for 1-2 weeks 01/30/22   [provider]     Family History Family History  Problem Relation Age of Onset   Breast cancer Mother    Hyperlipidemia Mother        entire family   Hypertension Mother    Aneurysm Father        AAA   Stroke Father    Hypertension Sister    Breast cancer Paternal Grandmother    Hypertension Brother     Social History Social History   Tobacco Use   Smoking status: Never    Passive exposure: Never   Smokeless tobacco: Never  Vaping Use   Vaping status: Never Used  Substance Use Topics   Alcohol use: No   Drug use: No     Allergies   Neomycin , Pregabalin, Propoxyphene hcl, Pseudoephedrine, Sulfa antibiotics, Atorvastatin, Bactrim [sulfamethoxazole-trimethoprim ], Bupropion, Colesevelam, Ezetimibe, Pravastatin sodium, Rosuvastatin, and Statins   Review of Systems Review of Systems  Constitutional: Negative.   HENT:  Positive for congestion and rhinorrhea.   Respiratory:  Positive for cough.   Gastrointestinal: Negative.   Genitourinary: Negative.   Musculoskeletal: Negative.   Neurological:  Positive for headaches.  Psychiatric/Behavioral: Negative.       Physical Exam Triage Vital Signs ED Triage Vitals  Encounter Vitals Group     BP 08/18/24 1014 126/67     Girls Systolic BP Percentile --      Girls Diastolic BP Percentile --      Boys Systolic BP Percentile --      Boys Diastolic BP Percentile --      Pulse Rate 08/18/24 1014 70     Resp 08/18/24 1014 18     Temp 08/18/24 1014 97.9 F (36.6 C)     Temp Source 08/18/24 1014 Oral     SpO2 08/18/24 1014 98 %     Weight 08/18/24 1013 142 lb (64.4 kg)     Height --      Head Circumference --      Peak Flow --      Pain Score 08/18/24 1012 0     Pain Loc --      Pain Education --      Exclude from Growth Chart --    No data found.  Updated Vital  Signs BP 126/67 (BP Location: Right Arm)   Pulse 70   Temp 97.9 F (36.6 C) (Oral)   Resp 18   Wt 64.4 kg   SpO2 98%   BMI 23.63 kg/m   Visual Acuity Right  Eye Distance:   Left Eye Distance:   Bilateral Distance:    Right Eye Near:   Left Eye Near:    Bilateral Near:     Physical Exam Constitutional:      Appearance: She is well-developed and normal weight.  Cardiovascular:     Rate and Rhythm: Normal rate and regular rhythm.  Pulmonary:     Effort: Pulmonary effort is normal.     Breath sounds: Normal breath sounds. No wheezing, rhonchi or rales.  Musculoskeletal:     Cervical back: Normal range of motion and neck supple.  Lymphadenopathy:     Cervical: No cervical adenopathy.  Skin:    General: Skin is warm.  Neurological:     Mental Status: She is alert.  Psychiatric:        Mood and Affect: Mood normal.      UC Treatments / Results  Labs (all labs ordered are listed, but only abnormal results are displayed) Labs Reviewed  POC COVID19/FLU A&B COMBO    EKG   Radiology No results found.  Procedures Procedures (including critical care time)  Medications Ordered in UC Medications - No data to display  Initial Impression / Assessment and Plan / UC Course  I have reviewed the triage vital signs and the nursing notes.  Pertinent labs & imaging results that were available during my care of the patient were reviewed by me and considered in my medical decision making (see chart for details).  Final Clinical Impressions(s) / UC Diagnoses   Final diagnoses:  Acute cough  Viral URI     Discharge Instructions      You were seen today for upper respiratory symptoms.  Your flu/covid swab was negative today.  I have given you a medication to help with your cough.   Please return or follow up with your primary care provider if not improving.     ED Prescriptions     Medication Sig Dispense Auth. Provider   benzonatate  (TESSALON ) 200 MG capsule Take 1 capsule (200 mg total) by mouth 3 (three) times daily as needed for cough. 21 capsule Darral Longs, MD      PDMP not reviewed this encounter.   Darral Longs, MD 08/18/24 1038    Darral Longs, MD 08/18/24 1045

## 2024-08-18 NOTE — Discharge Instructions (Signed)
 You were seen today for upper respiratory symptoms.  Your flu/covid swab was negative today.  I have given you a medication to help with your cough.   Please return or follow up with your primary care provider if not improving.

## 2024-08-18 NOTE — ED Triage Notes (Signed)
 Pt presents c/o dry cough, headache, and nasal congestion x 4 days. Pt reports she has tried OTC Delsym and Robitussin but sxs are not improving. Pt denies any additional sxs.

## 2024-08-21 ENCOUNTER — Ambulatory Visit: Admitting: Gastroenterology

## 2024-08-31 NOTE — Progress Notes (Signed)
 Nykiah Ma [id number] Jul 13, 1951   Chief Complaint: Abdominal bloating  Referring Provider: Arloa Elsie SAUNDERS, MD Primary GI MD: Dr. Avram (previous Dr. Debrah)  HPI: Jailine Lieder is a 73 y.o. female with past medical history of anxiety/depression, skin cancer, GERD, hiatal hernia, stomach ulcer, HLD, kidney stones, CKD, appendectomy who presents today for a complaint of abdominal bloating.    Last seen in office 12/2014 by Dr. Debrah at which time she endorsed mild functional constipation was advised to start a fiber supplement.  Last colonoscopy 2015 was normal with recommended follow-up colonoscopy in 2025.  Remote history of colon polyps.  History of esophageal stricture s/p dilation, gastric and duodenal ulcers.  Referred by PCP for abdominal discomfort, bloating.  Per note had a normal RUQ ultrasound though did have a positive sonographic Murphy sign.  Some fatty infiltration of the liver.  RUQ pain noted to be somewhat positional, and intermittent.  On once daily pantoprazole  for history of GERD.  Seen by [facility],The surgery 03/10/2024 for RUQ abdominal pain.  RUQ US  ordered as well as chest x-ray.  Ultrasound with positive Murphy sign but no gallstones or gallbladder wall thickening.  Fatty infiltration of the liver.  Unremarkable chest x-ray.  Does not appear to have had any follow-up.  Labs 05/26/2024: Low TSH with normal T3 and T4.  Hyperlipidemia.  GFR 43 T. bili 2.3 (normal per provider review), normal CBC    Patient states she has been having abdominal pain, gas and bloating for the last several months.  Pain primarily localized to her upper abdomen.  Had RUQ ultrasound and chest x-ray as above.  Has been taking Gas-X with some relief of symptoms.  Feels she has increased gas and bloating all the time.  Pain seems to worsen after eating and with bending down or leaning forward.  Reports history of a cyst on her liver which has been present for years (seen on  imaging going back to 2005).  Gas and bloating started several months ago, seems to be worsening.  Everything she eats gets her gas.  Has occasional constipation and history of IBS.  Takes MiraLAX sometimes which helps.  Typically has a bowel movement in the morning.  Sometimes Gas-X can trigger a bowel movement.  Sometimes will have a sensation that she needs to have a bowel movement but then cannot.  This has been ongoing for the last 4 to 5 months.    Denies any fecal incontinence, bloody stools, melena.  Stools are dark if she takes Pepto-Bismol, otherwise brown.  She does have intermittent diarrhea which is worse if she has increased abdominal gas and flatulence.  She takes pantoprazole  at night.  Denies any heartburn, maybe has some occasional breakthrough reflux. Has intermittent epigastric pain which seems to be occurring more frequently lately.  Feels she sometimes has to adjust her position on the toilet in order to urinate.  Does have intermittent urinary leakage and wears a pad just in case.  History of frequent UTIs.  States she is on antibiotics frequently and sometimes will end up with thrush because of this.  Last time she was on antibiotics for UTI she was put on doxycycline and states that this caused stomach pain.  Has trouble with doxycycline and amoxicillin.  On September 1 took doxycycline for 3 days and stopped due to side effects.  Does okay with ciprofloxacin . States she has a sensitive stomach and many medications hurt her stomach.  Has stage III chronic kidney  disease and does not take NSAIDs.  Reports history of back pain and recent back surgery.  Takes prednisone  intermittently for back pain.  Has been having night sweats, unsure if hormonal.  Reports history of hormone replacement therapy about 20 years ago.  Previous GI Procedures/Imaging   RUQ US  03/12/2024 1. No gallstones or wall thickening visualized. The ultrasound technologist reports positive sonographic Murphy  sign. The findings are equivocal for acute cholecystitis. 2. Fatty infiltration of the liver.  Colonoscopy 09/29/2014 Normal colonoscopy Recall 10 years  EGD 08/31/2014 There was a stricture at the GE junction, dilated using 18 mm Maloney dilator Multiple ulcers measuring 1 x 1 mm in size were found in the gastric antrum, biopsies taken Single ulcer measuring 1 x 1 mm in size found in the duodenal bulb Path: Surgical [P], antrum, biopsy - BENIGN EROSIVE GASTRITIS. - NEGATIVE FOR INTESTINAL METAPLASIA, DYSPLASIA, OR MALIGNANCY. - WARTHIN-STARRY STAIN NEGATIVE FOR H. PYLORI.  Past Medical History:  Diagnosis Date   Anorexia    HISTORY OF    Anxiety    Cancer (HCC)    basal cell carcinoma per right side under arm    DDD (degenerative disc disease), lumbar    Depression    GERD (gastroesophageal reflux disease)    H/O hiatal hernia    History of stomach ulcers    Hyperlipidemia    Kidney stone    Sciatica    getting prednisone  inj/epidural injections   Tinnitus     Past Surgical History:  Procedure Laterality Date   APPENDECTOMY  1959   BACK SURGERY     with rods and screws placed in lumbar area    BREAST BIOPSY Right    benign cyst   COLONOSCOPY  2015   dilation of esophagus and polypectomy   CYST EXCISION Right    foot   CYSTOSCOPY KIDNEY W/ URETERAL GUIDE WIRE     ELBOW SURGERY Left 2003   EXCISION/RELEASE BURSA HIP Left 11/07/2015   Procedure: LEFT HIP BURSECTOMY WITH GLUTEAL TENDON REPAIR;  Surgeon: Dempsey Moan, MD;  Location: [facility];  Service: Orthopedics;  Laterality: Left;   EYE SURGERY Right 2012   right-pterigium   ROTATOR CUFF REPAIR Left 2003   TONSILLECTOMY  1976   UPPER GASTROINTESTINAL ENDOSCOPY  2015    Current Outpatient Medications  Medication Sig Dispense Refill   Acetaminophen  (TYLENOL ) 325 MG CAPS Tylenol      acetaminophen  (TYLENOL ) 500 MG tablet 1 tablet as needed Orally every 6 hrs     benzonatate  (TESSALON ) 200 MG capsule Take 1  capsule (200 mg total) by mouth 3 (three) times daily as needed for cough. 21 capsule 0   busPIRone (BUSPAR) 10 MG tablet Take 5-10 mg by mouth 2 (two) times daily.     chlordiazePOXIDE  (LIBRIUM ) 10 MG capsule 1 capsule Orally once a day as needed for abdominal pain     ciprofloxacin  (CILOXAN ) 0.3 % ophthalmic solution 1 2 DROPS IN AFFECTED EYE 4 TIMES/DAY X 5 DAYS     Citalopram Hydrobromide (CELEXA PO)      clindamycin (CLEOCIN) 150 MG capsule 3 capsules Orally every 8 hrs; Duration: 5 day(s)     clonazePAM  (KLONOPIN ) 1 MG tablet Take 1 mg by mouth at bedtime.     clonazePAM  (KLONOPIN ) 1 MG tablet 1 tablet Orally Once a day for 30 days     clotrimazole (MYCELEX) 10 MG troche 1 troche while on immunosuppressive medicine Mouth/Throat Three times a day for 10 days  doxycycline (VIBRA-TABS) 100 MG tablet 1 tablet Orally twice a day; Duration: 10 days     Estradiol-Norethindrone Acet 0.5-0.1 MG tablet Activella 0.5 mg-0.1 mg tablet  Take 1 tablet every day by oral route for 90 days.     fluconazole  (DIFLUCAN ) 150 MG tablet Take 1 tablet (150 mg total) by mouth every three (3) days as needed. Repeat if needed 2 tablet 0   fluocinonide (LIDEX) 0.05 % external solution      fluticasone (FLONASE ALLERGY RELIEF) 50 MCG/ACT nasal spray 1 spray in each nostril Nasally Once a day for 90 days     furosemide (LASIX) 20 MG tablet TAKE 1 TABLET ONCE A DAY FOR FLUID RETENTION ORALLY     gabapentin  (NEURONTIN ) 100 MG capsule Take 100 mg by mouth at bedtime.  5   hydrOXYzine (ATARAX) 10 MG tablet Take 10 mg by mouth daily as needed.     INFLUENZA VIRUS VACCINE LIVE NA ADM 0.7ML IM UTD     meclizine (ANTIVERT) 12.5 MG tablet 1 tablet Orally Three times a day as needed for dizziness     methocarbamol  (ROBAXIN ) 500 MG tablet 1 tablet Orally once a day at bedtime as needed for muscle spasm     metroNIDAZOLE (METROGEL) 1 % gel 1 application to affected area Externally Once a day     mupirocin  ointment (BACTROBAN )  2 % 1 Application.     nystatin  (MYCOSTATIN ) 100000 UNIT/ML suspension Use as directed 5 mLs (500,000 Units total) in the mouth or throat 4 (four) times daily as needed (for oral thrush.). 60 mL 0   pantoprazole  (PROTONIX ) 40 MG tablet Take 1 tablet (40 mg total) by mouth daily. 90 tablet 3   pantoprazole  (PROTONIX ) 40 MG tablet Take 1 tablet by mouth daily.     phenylephrine  (LITTLE REMEDIES DECONG NOSE) 0.125 % nasal drops Little Remedies 0.65 % nasal spray aerosol  PLACE 2 SPRAYS INTO BOTH NOSTRILS AS NEEDED FOR CONGESTION.     Phenylephrine  HCl (LITTLE REMEDIES DECONG NOSE NA) PLACE 2 SPRAYS INTO BOTH NOSTRILS AS NEEDED FOR CONGESTION.     sertraline  (ZOLOFT ) 25 MG tablet Take 3 tablets by mouth daily.     sertraline  (ZOLOFT ) 50 MG tablet Take 50 mg by mouth at bedtime. Pt stated, I only take a 1/2 tablet  0   triamcinolone  cream (KENALOG ) 0.1 % 1 application Externally to the affected skin areas Two times a day for 1-2 weeks     No current facility-administered medications for this visit.    Allergies as of 09/03/2024 - Review Complete 08/18/2024  Allergen Reaction Noted   Neomycin   06/05/2023   Pregabalin  06/05/2023   Propoxyphene hcl  06/05/2023   Pseudoephedrine  06/05/2023   Sulfa antibiotics Other (See Comments) 11/01/2015   Atorvastatin  06/05/2023   Bactrim [sulfamethoxazole-trimethoprim ] Other (See Comments) 07/23/2014   Bupropion  06/05/2023   Colesevelam  06/05/2023   Ezetimibe  06/05/2023   Pravastatin sodium  06/05/2023   Rosuvastatin  06/05/2023   Statins Other (See Comments) 02/18/2015    Family History  Problem Relation Age of Onset   Breast cancer Mother    Hyperlipidemia Mother        entire family   Hypertension Mother    Aneurysm Father        AAA   Stroke Father    Hypertension Sister    Breast cancer Paternal Grandmother    Hypertension Brother     Social History   Tobacco Use  Smoking status: Never    Passive exposure: Never   Smokeless  tobacco: Never  Vaping Use   Vaping status: Never Used  Substance Use Topics   Alcohol use: No   Drug use: No     Review of Systems:    Constitutional: No weight loss, fever, chills Cardiovascular: No chest pain Respiratory: No SOB Gastrointestinal: See HPI and otherwise negative   Physical Exam:  Vital signs: BP 120/80   Pulse (!) 51   Ht 5' 5 (1.651 m)   Wt 143 lb (64.9 kg)   BMI 23.80 kg/m    Constitutional: Pleasant, well-appearing female in NAD, alert and cooperative Head:  Normocephalic and atraumatic.  Eyes: No scleral icterus.  Respiratory: Respirations even and unlabored. Lungs clear to auscultation bilaterally.  No wheezes, crackles, or rhonchi.  Cardiovascular:  Regular rate and rhythm. No murmurs. No peripheral edema. Gastrointestinal:  Soft, nondistended, nontender. No rebound or guarding. Normal bowel sounds. No appreciable masses or hepatomegaly. Rectal:  Not performed.  Neurologic:  Alert and oriented x4;  grossly normal neurologically.  Skin:   Dry and intact without significant lesions or rashes. Psychiatric: Oriented to person, place and time. Demonstrates good judgement and reason without abnormal affect or behaviors.   RELEVANT LABS AND IMAGING: CBC    Component Value Date/Time   WBC 7.5 11/01/2015 1115   RBC 4.72 11/01/2015 1115   HGB 12.0 11/01/2015 1115   HCT 37.8 11/01/2015 1115   PLT 190 11/01/2015 1115   MCV 80.1 11/01/2015 1115   MCH 25.4 (L) 11/01/2015 1115   MCHC 31.7 11/01/2015 1115   RDW 17.3 (H) 11/01/2015 1115   LYMPHSABS 1.7 05/31/2015 1423   MONOABS 0.5 05/31/2015 1423   EOSABS 0.1 05/31/2015 1423   BASOSABS 0.0 05/31/2015 1423    CMP     Component Value Date/Time   NA 142 11/01/2015 1115   K 5.0 11/01/2015 1115   CL 110 11/01/2015 1115   CO2 27 11/01/2015 1115   GLUCOSE 85 11/01/2015 1115   BUN 17 11/01/2015 1115   CREATININE 0.89 11/01/2015 1115   CALCIUM 9.8 11/01/2015 1115   PROT 6.6 05/31/2015 1423   ALBUMIN  3.7 05/31/2015 1423   AST 16 05/31/2015 1423   ALT 14 05/31/2015 1423   ALKPHOS 88 05/31/2015 1423   BILITOT 1.2 05/31/2015 1423   GFRNONAA >60 11/01/2015 1115   GFRAA >60 11/01/2015 1115    Assessment/Plan:   GERD Upper abdominal pain Abdominal gas and bloating History of stomach ulcers Irritable bowel syndrome with alternating bowel habits Loose stools Screening for colon cancer Patient seen today for multiple GI complaints.  Somewhat difficult history.  In the last several months has been having increased abdominal gas, bloating, and upper abdominal pain.  Had a chest x-ray which was unremarkable, RUQ ultrasound with positive Murphy sign but no gallstones or gallbladder wall thickening, and fatty infiltration of the liver. Endorses history of IBS predominantly with constipation, though recently has been having intermittent loose stools/diarrhea.  Denies any rectal bleeding, melena, fecal incontinence. Does have a history of frequent antibiotic use for UTIs.  Most recently took doxycycline for a few days in early September and discontinued due to side effects.  Has also been having intermittent epigastric pain, occasional breakthrough reflux though symptoms are largely controlled on pantoprazole .  Does have history of stomach ulcers as seen on EGD in 2015. She is due for repeat colonoscopy 09/29/2024.  - Check stool culture and C. difficile to rule out infectious  diarrhea - Will plan for EGD/colonoscopy if stool studies negative. I thoroughly discussed the procedure with the patient to include nature of the procedure, alternatives, benefits, and risks (including but not limited to bleeding, infection, perforation, anesthesia/cardiac/pulmonary complications). Patient verbalized understanding and gave verbal consent to proceed with procedure.  - Labs today: CBC, CMP, lipase - Recommend low FODMAP diet - Consider SIBO breath test versus empiric treatment if above workup negative -  Consider HIDA scan vs cross-sectional imaging for further evaluation of upper abdominal pain.    Camie Furbish, PA-C [facility] 08/31/2024, 7:50 AM  Patient Care Team: Arloa Elsie SAUNDERS, MD as PCP - General (Family Medicine)

## 2024-09-01 ENCOUNTER — Other Ambulatory Visit: Payer: Self-pay | Admitting: Family Medicine

## 2024-09-01 DIAGNOSIS — Z0000 Encounter for general adult medical examination without abnormal findings: Secondary | ICD-10-CM

## 2024-09-03 ENCOUNTER — Encounter: Payer: Self-pay | Admitting: Gastroenterology

## 2024-09-03 ENCOUNTER — Other Ambulatory Visit

## 2024-09-03 ENCOUNTER — Ambulatory Visit (INDEPENDENT_AMBULATORY_CARE_PROVIDER_SITE_OTHER): Admitting: Gastroenterology

## 2024-09-03 VITALS — BP 120/80 | HR 51 | Ht 65.0 in | Wt 143.0 lb

## 2024-09-03 DIAGNOSIS — R195 Other fecal abnormalities: Secondary | ICD-10-CM

## 2024-09-03 DIAGNOSIS — Z8719 Personal history of other diseases of the digestive system: Secondary | ICD-10-CM

## 2024-09-03 DIAGNOSIS — R1010 Upper abdominal pain, unspecified: Secondary | ICD-10-CM | POA: Diagnosis not present

## 2024-09-03 DIAGNOSIS — K582 Mixed irritable bowel syndrome: Secondary | ICD-10-CM

## 2024-09-03 DIAGNOSIS — Z8711 Personal history of peptic ulcer disease: Secondary | ICD-10-CM | POA: Diagnosis not present

## 2024-09-03 DIAGNOSIS — Z1211 Encounter for screening for malignant neoplasm of colon: Secondary | ICD-10-CM

## 2024-09-03 DIAGNOSIS — R140 Abdominal distension (gaseous): Secondary | ICD-10-CM

## 2024-09-03 DIAGNOSIS — K219 Gastro-esophageal reflux disease without esophagitis: Secondary | ICD-10-CM

## 2024-09-03 LAB — CBC WITH DIFFERENTIAL/PLATELET
Basophils Absolute: 0.1 K/uL (ref 0.0–0.1)
Basophils Relative: 0.6 % (ref 0.0–3.0)
Eosinophils Absolute: 0.1 K/uL (ref 0.0–0.7)
Eosinophils Relative: 1.4 % (ref 0.0–5.0)
HCT: 39.6 % (ref 36.0–46.0)
Hemoglobin: 13 g/dL (ref 12.0–15.0)
Lymphocytes Relative: 21.7 % (ref 12.0–46.0)
Lymphs Abs: 1.7 K/uL (ref 0.7–4.0)
MCHC: 32.9 g/dL (ref 30.0–36.0)
MCV: 81.7 fl (ref 78.0–100.0)
Monocytes Absolute: 0.3 K/uL (ref 0.1–1.0)
Monocytes Relative: 4.2 % (ref 3.0–12.0)
Neutro Abs: 5.8 K/uL (ref 1.4–7.7)
Neutrophils Relative %: 72.1 % (ref 43.0–77.0)
Platelets: 201 K/uL (ref 150.0–400.0)
RBC: 4.84 Mil/uL (ref 3.87–5.11)
RDW: 14.2 % (ref 11.5–15.5)
WBC: 8 K/uL (ref 4.0–10.5)

## 2024-09-03 LAB — COMPREHENSIVE METABOLIC PANEL WITH GFR
ALT: 9 U/L (ref 0–35)
AST: 14 U/L (ref 0–37)
Albumin: 4.4 g/dL (ref 3.5–5.2)
Alkaline Phosphatase: 96 U/L (ref 39–117)
BUN: 16 mg/dL (ref 6–23)
CO2: 28 meq/L (ref 19–32)
Calcium: 9.7 mg/dL (ref 8.4–10.5)
Chloride: 106 meq/L (ref 96–112)
Creatinine, Ser: 1.14 mg/dL (ref 0.40–1.20)
GFR: 47.33 mL/min — ABNORMAL LOW (ref >60.00)
Glucose, Bld: 96 mg/dL (ref 70–99)
Potassium: 4.4 meq/L (ref 3.5–5.1)
Sodium: 141 meq/L (ref 135–145)
Total Bilirubin: 1.2 mg/dL (ref 0.2–1.2)
Total Protein: 7.2 g/dL (ref 6.0–8.3)

## 2024-09-03 LAB — LIPASE: Lipase: 22 U/L (ref 11.0–59.0)

## 2024-09-03 NOTE — Patient Instructions (Addendum)
 Your provider has requested that you go to the basement level for lab work before leaving today. Press B on the elevator. The lab is located at the first door on the left as you exit the elevator.  Due to recent changes in healthcare laws, you may see the results of your imaging and laboratory studies on MyChart before your provider has had a chance to review them.  We understand that in some cases there may be results that are confusing or concerning to you. Not all laboratory results come back in the same time frame and the provider may be waiting for multiple results in order to interpret others.  Please give us  48 hours in order for your provider to thoroughly review all the results before contacting the office for clarification of your results.    Your provider has ordered [facility] stool testing for you. You have received a kit from our office today containing all necessary supplies to complete this test. Please carefully read the stool collection instructions provided in the kit before opening the accompanying materials. In addition, be sure there is a label providing your full name and date of birth on the puritan opti-swab tube that is supplied in the kit (if you do not see a label with this information on your test tube, please make us  aware before test collection!). After completing the test, you should secure the purtian tube into the specimen biohazard bag. The [facility] E-Req sheet (including date and time of specimen collection) should be placed into the outside pocket of the specimen biohazard bag and returned to the [place] lab (basement floor of [facility]) within 3 days of collection. Please make sure to give the specimen to a staff member at the lab. DO NOT leave the specimen on the counter.   If the specimen date and time (can be found in the upper right boxed portion of the sheet) are not filled out on the E-Req sheet, the test will NOT be  performed.    Thank you for trusting me with your gastrointestinal care!   Camie Furbish, PA-C  _______________________________________________________  If your blood pressure at your visit was 140/90 or greater, please contact your primary care physician to follow up on this.  _______________________________________________________  If you are age 44 or older, your body mass index should be between 23-30. Your Body mass index is 23.8 kg/m. If this is out of the aforementioned range listed, please consider follow up with your Primary Care Provider.  If you are age 25 or younger, your body mass index should be between 19-25. Your Body mass index is 23.8 kg/m. If this is out of the aformentioned range listed, please consider follow up with your Primary Care Provider.   ________________________________________________________  The [facility] providers would like to encourage you to use MYCHART to communicate with providers for non-urgent requests or questions.  Due to long hold times on the telephone, sending your provider a message by [facility] may be a faster and more efficient way to get a response.  Please allow 48 business hours for a response.  Please remember that this is for non-urgent requests.  _______________________________________________________  [facility] is using a team-based approach to care.  Your team is made up of your doctor and two to three APPS. Our APPS (Nurse Practitioners and Physician Assistants) work with your physician to ensure care continuity for you. They are fully qualified to address your health concerns and develop a treatment plan.  They communicate directly with your gastroenterologist to care for you. Seeing the Advanced Practice Practitioners on your physician's team can help you by facilitating care more promptly, often allowing for earlier appointments, access to diagnostic testing, procedures, and other specialty referrals.

## 2024-09-04 DIAGNOSIS — Z8711 Personal history of peptic ulcer disease: Secondary | ICD-10-CM | POA: Diagnosis not present

## 2024-09-04 DIAGNOSIS — R140 Abdominal distension (gaseous): Secondary | ICD-10-CM | POA: Diagnosis not present

## 2024-09-04 DIAGNOSIS — R1010 Upper abdominal pain, unspecified: Secondary | ICD-10-CM | POA: Diagnosis not present

## 2024-09-04 DIAGNOSIS — M545 Low back pain, unspecified: Secondary | ICD-10-CM | POA: Diagnosis not present

## 2024-09-04 DIAGNOSIS — K582 Mixed irritable bowel syndrome: Secondary | ICD-10-CM | POA: Diagnosis not present

## 2024-09-04 DIAGNOSIS — R195 Other fecal abnormalities: Secondary | ICD-10-CM | POA: Diagnosis not present

## 2024-09-05 ENCOUNTER — Encounter: Payer: Self-pay | Admitting: Gastroenterology

## 2024-09-07 ENCOUNTER — Ambulatory Visit: Payer: Self-pay | Admitting: Gastroenterology

## 2024-09-07 DIAGNOSIS — R1010 Upper abdominal pain, unspecified: Secondary | ICD-10-CM

## 2024-09-07 DIAGNOSIS — R195 Other fecal abnormalities: Secondary | ICD-10-CM

## 2024-09-07 DIAGNOSIS — Z1211 Encounter for screening for malignant neoplasm of colon: Secondary | ICD-10-CM

## 2024-09-07 DIAGNOSIS — K219 Gastro-esophageal reflux disease without esophagitis: Secondary | ICD-10-CM

## 2024-09-07 DIAGNOSIS — R140 Abdominal distension (gaseous): Secondary | ICD-10-CM

## 2024-09-10 NOTE — Telephone Encounter (Signed)
 Reviewed [facility] website. Found results for pt. All results reported as not detected. Will route to provider to determine where pt should be scheduled. [facility]. Will place results in provider's box for review and sign off.

## 2024-09-11 MED ORDER — NA SULFATE-K SULFATE-MG SULF 17.5-3.13-1.6 GM/177ML PO SOLN: 1.0000 | Freq: Once | ORAL | 0 refills | Status: AC

## 2024-09-17 DIAGNOSIS — B370 Candidal stomatitis: Secondary | ICD-10-CM | POA: Diagnosis not present

## 2024-09-21 ENCOUNTER — Telehealth: Payer: Self-pay | Admitting: Gastroenterology

## 2024-09-21 NOTE — Telephone Encounter (Signed)
 [facility] GI pathogen panel results received.  No pathogens detected 09/05/2024.

## 2024-09-21 NOTE — Telephone Encounter (Signed)
 Called the patient. No answer. Left results details on her voicemail.

## 2024-09-28 ENCOUNTER — Encounter: Payer: Self-pay | Admitting: Gastroenterology

## 2024-10-14 ENCOUNTER — Telehealth: Payer: Self-pay | Admitting: Internal Medicine

## 2024-10-14 ENCOUNTER — Encounter: Payer: Self-pay | Admitting: Internal Medicine

## 2024-10-14 NOTE — Telephone Encounter (Signed)
 Patient stated that her insurance has not received prior authorization for 11/20 colonoscopy. Patient requesting a call back. Please advise, thank you

## 2024-10-22 ENCOUNTER — Encounter: Payer: Self-pay | Admitting: Internal Medicine

## 2024-10-22 ENCOUNTER — Ambulatory Visit: Admitting: Internal Medicine

## 2024-10-22 VITALS — BP 122/66 | HR 60 | Temp 98.2°F | Resp 12 | Ht 65.0 in | Wt 143.0 lb

## 2024-10-22 DIAGNOSIS — Z1211 Encounter for screening for malignant neoplasm of colon: Secondary | ICD-10-CM

## 2024-10-22 DIAGNOSIS — R1310 Dysphagia, unspecified: Secondary | ICD-10-CM | POA: Diagnosis not present

## 2024-10-22 DIAGNOSIS — K3189 Other diseases of stomach and duodenum: Secondary | ICD-10-CM | POA: Diagnosis not present

## 2024-10-22 DIAGNOSIS — R1319 Other dysphagia: Secondary | ICD-10-CM

## 2024-10-22 DIAGNOSIS — R1010 Upper abdominal pain, unspecified: Secondary | ICD-10-CM

## 2024-10-22 DIAGNOSIS — K219 Gastro-esophageal reflux disease without esophagitis: Secondary | ICD-10-CM

## 2024-10-22 DIAGNOSIS — R140 Abdominal distension (gaseous): Secondary | ICD-10-CM

## 2024-10-22 DIAGNOSIS — K317 Polyp of stomach and duodenum: Secondary | ICD-10-CM | POA: Diagnosis not present

## 2024-10-22 MED ORDER — SODIUM CHLORIDE 0.9 % IV SOLN: 500.0000 mL | INTRAVENOUS | Status: DC

## 2024-10-22 MED ORDER — DICYCLOMINE HCL 20 MG PO TABS: 20.0000 mg | ORAL_TABLET | Freq: Three times a day (TID) | ORAL | 5 refills | Status: AC

## 2024-10-22 NOTE — Progress Notes (Signed)
 Difficulty obtaining correct pulse oximetry reading at beginning of case, patient stable, equipment changed proper SPO2 reading obtained

## 2024-10-22 NOTE — Op Note (Signed)
 [facility] Patient Name: Alice [name] Procedure Date: 10/22/2024 7:22 AM MRN: [id number] Endoscopist: Lupita FORBES Commander , MD, [id number] Age: 73 Referring MD:  Date of Birth: [birth year] Gender: Female Account #: [id number] Procedure:                Colonoscopy Indications:              Screening for colorectal malignant neoplasm, Last                            colonoscopy: 2015 Medicines:                Monitored Anesthesia Care Procedure:                Pre-Anesthesia Assessment:                           - Prior to the procedure, a History and Physical                            was performed, and patient medications and                            allergies were reviewed. The patient's tolerance of                            previous anesthesia was also reviewed. The risks                            and benefits of the procedure and the sedation                            options and risks were discussed with the patient.                            All questions were answered, and informed consent                            was obtained. Prior Anticoagulants: The patient has                            taken no anticoagulant or antiplatelet agents. ASA                            Grade Assessment: III - A patient with severe                            systemic disease. After reviewing the risks and                            benefits, the patient was deemed in satisfactory                            condition to undergo the procedure.  After obtaining informed consent, the colonoscope                            was passed under direct vision. Throughout the                            procedure, the patient's blood pressure, pulse, and                            oxygen saturations were monitored continuously. The                            Olympus Scope SN: [id number] was introduced through                            the anus and advanced to the the  terminal ileum,                            with identification of the appendiceal orifice and                            IC valve. The colonoscopy was performed without                            difficulty. The patient tolerated the procedure                            well. The quality of the bowel preparation was                            excellent. The terminal ileum, ileocecal valve,                            appendiceal orifice, and rectum were photographed.                            The bowel preparation used was SUPREP via split                            dose instruction. Scope In: 8:13:15 AM Scope Out: 8:23:04 AM Scope Withdrawal Time: 0 hours 7 minutes 59 seconds  Total Procedure Duration: 0 hours 9 minutes 49 seconds  Findings:                 The perianal and digital rectal examinations were                            normal.                           The terminal ileum appeared normal.                           The entire examined colon appeared normal on direct  and retroflexion views. Complications:            No immediate complications. Estimated Blood Loss:     Estimated blood loss: none. Impression:               - The examined portion of the ileum was normal.                           - The entire examined colon is normal on direct and                            retroflexion views.                           - No specimens collected. Recommendation:           - Patient has a contact number available for                            emergencies. The signs and symptoms of potential                            delayed complications were discussed with the                            patient. Return to normal activities tomorrow.                            Written discharge instructions were provided to the                            patient.                           - Resume previous diet.                           - Continue present  medications.                           - No repeat colonoscopy due to current age (30                            years or older) and the absence of colonic polyps.                           - Start dicyclomine 20 mg tid ac for IBS Lupita FORBES Commander, MD 10/22/2024 8:39:05 AM This report has been signed electronically.

## 2024-10-22 NOTE — Progress Notes (Signed)
 [facility] History and Physical   Primary Care Physician:  Arloa Elsie SAUNDERS, MD   Reason for Procedure:    Encounter Diagnoses  Name Primary?   Pain of upper abdomen Yes   Gastroesophageal reflux disease without esophagitis    Abdominal bloating    Esophageal dysphagia    Screening for colon cancer      Plan:    EGD, possible dilation and colonoscopy   The patient was provided an opportunity to ask questions and all were answered. The patient agreed with the plan.   HPI: Alice [name] is a 73 y.o. female seen in my clinic by Lauraine Furbish PA-C on September 03, 2024 with complaints of upper abdominal pain with bloating, and also had complaints of IBS with alternating bowel habits.  Prior ultrasound unremarkable except for positive Murphy sign.  It did show steatosis.  No gallstones.Some dysphagia A CBC, CMET, lipase and a targeted GI pathogen panel were negative though she does have chronic kidney disease.  Past Medical History:  Diagnosis Date   Anorexia    HISTORY OF    Anxiety    Cancer (HCC)    basal cell carcinoma per right side under arm    DDD (degenerative disc disease), lumbar    Depression    GERD (gastroesophageal reflux disease)    H/O hiatal hernia    History of stomach ulcers    Hyperlipidemia    Kidney stone    Sciatica    getting prednisone  inj/epidural injections   Tinnitus     Past Surgical History:  Procedure Laterality Date   APPENDECTOMY  1959   BACK SURGERY     with rods and screws placed in lumbar area    BREAST BIOPSY Right    benign cyst   COLONOSCOPY  2015   dilation of esophagus and polypectomy   CYST EXCISION Right    foot   CYSTOSCOPY KIDNEY W/ URETERAL GUIDE WIRE     ELBOW SURGERY Left 2003   EXCISION/RELEASE BURSA HIP Left 11/07/2015   Procedure: LEFT HIP BURSECTOMY WITH GLUTEAL TENDON REPAIR;  Surgeon: Dempsey Moan, MD;  Location: [facility];  Service: Orthopedics;  Laterality: Left;   EYE SURGERY Right 2012    right-pterigium   ROTATOR CUFF REPAIR Left 2003   TONSILLECTOMY  1976   UPPER GASTROINTESTINAL ENDOSCOPY  2015     Current Outpatient Medications  Medication Sig Dispense Refill   acetaminophen  (TYLENOL ) 500 MG tablet 1 tablet as needed Orally every 6 hrs     clonazePAM  (KLONOPIN ) 1 MG tablet 1 tablet Orally Once a day for 30 days     gabapentin  (NEURONTIN ) 100 MG capsule Take 100 mg by mouth at bedtime.  5   HYDROcodone -acetaminophen  (NORCO) 10-325 MG tablet Take 1 tablet 3 times a day by oral route as needed for pain for 5 days.     pantoprazole  (PROTONIX ) 40 MG tablet Take 1 tablet by mouth daily.     sertraline  (ZOLOFT ) 25 MG tablet Take 3 tablets by mouth daily.     chlordiazePOXIDE  (LIBRIUM ) 10 MG capsule 1 capsule Orally once a day as needed for abdominal pain     ciprofloxacin  (CILOXAN ) 0.3 % ophthalmic solution 1 2 DROPS IN AFFECTED EYE 4 TIMES/DAY X 5 DAYS     Estradiol-Norethindrone Acet 0.5-0.1 MG tablet Activella 0.5 mg-0.1 mg tablet  Take 1 tablet every day by oral route for 90 days. (Patient not taking: Reported on 10/22/2024)     fluocinonide (LIDEX) 0.05 %  external solution      fluticasone (FLONASE ALLERGY RELIEF) 50 MCG/ACT nasal spray 1 spray in each nostril Nasally Once a day for 90 days     meclizine (ANTIVERT) 12.5 MG tablet 1 tablet Orally Three times a day as needed for dizziness     methocarbamol  (ROBAXIN ) 500 MG tablet 1 tablet Orally once a day at bedtime as needed for muscle spasm     metroNIDAZOLE (METROGEL) 1 % gel 1 application to affected area Externally Once a day     mupirocin  ointment (BACTROBAN ) 2 % 1 Application.     nystatin  (MYCOSTATIN ) 100000 UNIT/ML suspension Use as directed 5 mLs (500,000 Units total) in the mouth or throat 4 (four) times daily as needed (for oral thrush.). 60 mL 0   Phenylephrine  HCl (LITTLE REMEDIES DECONG NOSE NA) PLACE 2 SPRAYS INTO BOTH NOSTRILS AS NEEDED FOR CONGESTION. (Patient not taking: Reported on 10/22/2024)      triamcinolone  cream (KENALOG ) 0.1 % 1 application Externally to the affected skin areas Two times a day for 1-2 weeks     Current Facility-Administered Medications  Medication Dose Route Frequency Provider Last Rate Last Admin   0.9 %  sodium chloride  infusion  500 mL Intravenous Continuous Avram Lupita BRAVO, MD        Allergies as of 10/22/2024 - Review Complete 10/22/2024  Allergen Reaction Noted   Neomycin  Other (See Comments) 06/05/2023   Penicillins Other (See Comments) 08/11/2012   Pregabalin Other (See Comments) 06/05/2023   Pseudoephedrine Other (See Comments) 06/05/2023   Sulfa antibiotics Other (See Comments) 11/01/2015   Bupropion Other (See Comments) 06/05/2023   Colesevelam Other (See Comments) 06/05/2023   Erythromycin  Other (See Comments) 06/05/2023   Ezetimibe Other (See Comments) 06/05/2023   Ropinirole Other (See Comments) 06/05/2023   Rosuvastatin Other (See Comments) 06/05/2023   Statins Other (See Comments) 02/18/2015    Family History  Problem Relation Age of Onset   Breast cancer Mother    Hyperlipidemia Mother        entire family   Hypertension Mother    Aneurysm Father        AAA   Stroke Father    Hypertension Sister    Hypertension Brother    Breast cancer Paternal Grandmother    Colon cancer Neg Hx    Esophageal cancer Neg Hx    Rectal cancer Neg Hx    Stomach cancer Neg Hx     Social History   Socioeconomic History   Marital status: Single    Spouse name: Not on file   Number of children: 0   Years of education: Not on file   Highest education level: Not on file  Occupational History   Occupation: Claims    Employer: [employer]    Comment: [employer]  Tobacco Use   Smoking status: Never    Passive exposure: Never   Smokeless tobacco: Never  Vaping Use   Vaping status: Never Used  Substance and Sexual Activity   Alcohol use: No   Drug use: No   Sexual activity: Not Currently  Other Topics Concern   Not on file   Social History Narrative   Not on file   Social Drivers of Health   Financial Resource Strain: Not on file  Food Insecurity: Low Risk  (04/18/2023)   Received from [facility]   Hunger Vital Sign    Within the past 12 months, you worried that your food would run out before you got money  to buy more: Never true    Within the past 12 months, the food you bought just didn't last and you didn't have money to get more. : Never true  Transportation Needs: Not on file (04/18/2023)  Recent Concern: Transportation Needs - Unmet Transportation Needs (04/18/2023)   Received from [facility]    In the past 12 months, has lack of reliable transportation kept you from medical appointments, meetings, work or from getting things needed for daily living? : Yes  Physical Activity: Not on file  Stress: Not on file  Social Connections: Not on file  Intimate Partner Violence: Not on file    Review of Systems:  All other review of systems negative except as mentioned in the HPI.  Physical Exam: Vital signs BP 120/63   Pulse 64   Temp 98.2 F (36.8 C) (Temporal)   Resp 13   Ht 5' 5 (1.651 m)   Wt 143 lb (64.9 kg)   SpO2 98%   BMI 23.80 kg/m   General:   Alert,  Well-developed, well-nourished, pleasant and cooperative in NAD Lungs:  Clear throughout to auscultation.   Heart:  Regular rate and rhythm; no murmurs, clicks, rubs,  or gallops. Abdomen:  Soft, nontender and nondistended. Normal bowel sounds.   Neuro/Psych:  Alert and cooperative. Normal mood and affect. A and O x 3   @Lyly Canizales  CHARLENA Commander, MD, [facility] [phone] (pager) 10/22/2024 8:04 AM@

## 2024-10-22 NOTE — Op Note (Signed)
 [facility] Patient Name: Alice [name] Procedure Date: 10/22/2024 7:23 AM MRN: [id number] Endoscopist: Lupita FORBES Commander , MD, [id number] Age: 73 Referring MD:  Date of Birth: 18-Aug-1951 Gender: Female Account #: [id number] Procedure:                Upper GI endoscopy Indications:              Upper abdominal pain, Dysphagia Medicines:                Monitored Anesthesia Care Procedure:                Pre-Anesthesia Assessment:                           - Prior to the procedure, a History and Physical                            was performed, and patient medications and                            allergies were reviewed. The patient's tolerance of                            previous anesthesia was also reviewed. The risks                            and benefits of the procedure and the sedation                            options and risks were discussed with the patient.                            All questions were answered, and informed consent                            was obtained. Prior Anticoagulants: The patient has                            taken no anticoagulant or antiplatelet agents. ASA                            Grade Assessment: III - A patient with severe                            systemic disease. After reviewing the risks and                            benefits, the patient was deemed in satisfactory                            condition to undergo the procedure.                           After obtaining informed consent, the endoscope was  passed under direct vision. Throughout the                            procedure, the patient's blood pressure, pulse, and                            oxygen saturations were monitored continuously. The                            GIF [id number] #[id number] was introduced through the                            mouth, and advanced to the second part of duodenum.                            The upper GI  endoscopy was accomplished without                            difficulty. The patient tolerated the procedure                            well. Scope In: Scope Out: Findings:                 No endoscopic abnormality was evident in the                            esophagus to explain the patient's complaint of                            dysphagia. It was decided, however, to proceed with                            dilation of the entire esophagus. The scope was                            withdrawn. Dilation was performed with a Maloney                            dilator with no resistance at 54 Fr. The dilation                            site was examined following endoscope reinsertion                            and showed no change. Estimated blood loss: none.                           Patchy mildly erythematous mucosa was found in the                            gastric antrum. Biopsies were taken with a cold  forceps for histology. Verification of patient                            identification for the specimen was done. Estimated                            blood loss was minimal.                           Multiple small sessile polyps were found in the                            gastric fundus and in the gastric body.                           The gastroesophageal flap valve was visualized                            endoscopically and classified as Hill Grade II                            (fold present, opens with respiration).                           The cardia and gastric fundus were normal on                            retroflexion.                           The exam was otherwise without abnormality. Complications:            No immediate complications. Estimated Blood Loss:     Estimated blood loss was minimal. Impression:               - No endoscopic esophageal abnormality to explain                            patient's dysphagia. Esophagus dilated.  Dilated.                           - Erythematous mucosa in the antrum. Biopsied.                           - Multiple gastric polyps. These are classic for                            innocent fundic gland polyps.                           - Gastroesophageal flap valve classified as Hill                            Grade II (fold present, opens with respiration).                           -  The examination was otherwise normal. Lupita FORBES Commander, MD 10/22/2024 8:36:20 AM This report has been signed electronically.

## 2024-10-22 NOTE — Progress Notes (Signed)
 Sedate, gd SR, tolerated procedure well, VSS, report to RN

## 2024-10-22 NOTE — Patient Instructions (Addendum)
 I checked the stomach for inflammation.  There was some red areas in the lining which could be inflammation and/or infection.  Otherwise it looked normal I did dilate the esophagus since you have swallowing difficulty.  Colonoscopy was normal also.  I think most of your symptoms perhaps all could be from IBS, irritable bowel syndrome.  I prescribed a medicine called dicyclomine to take before meals to try to help you.  Give that a try and we will review the biopsies and make additional recommendations.  I appreciate the opportunity to care for you. Lupita CHARLENA Commander, MD, FACG   YOU HAD AN ENDOSCOPIC PROCEDURE TODAY AT THE [facility]:   Refer to the procedure report that was given to you for any specific questions about what was found during the examination.  If the procedure report does not answer your questions, please call your gastroenterologist to clarify.  If you requested that your care partner not be given the details of your procedure findings, then the procedure report has been included in a sealed envelope for you to review at your convenience later.  YOU SHOULD EXPECT: Some feelings of bloating in the abdomen. Passage of more gas than usual.  Walking can help get rid of the air that was put into your GI tract during the procedure and reduce the bloating. If you had a lower endoscopy (such as a colonoscopy or flexible sigmoidoscopy) you may notice spotting of blood in your stool or on the toilet paper. If you underwent a bowel prep for your procedure, you may not have a normal bowel movement for a few days.  Please Note:  You might notice some irritation and congestion in your nose or some drainage.  This is from the oxygen used during your procedure.  There is no need for concern and it should clear up in a day or so.  SYMPTOMS TO REPORT IMMEDIATELY:  Following lower endoscopy (colonoscopy or flexible sigmoidoscopy):  Excessive amounts of blood in the stool  Significant  tenderness or worsening of abdominal pains  Swelling of the abdomen that is new, acute  Fever of 100F or higher  Following upper endoscopy (EGD)  Vomiting of blood or coffee ground material  New chest pain or pain under the shoulder blades  Painful or persistently difficult swallowing  New shortness of breath  Fever of 100F or higher  Black, tarry-looking stools  For urgent or emergent issues, a gastroenterologist can be reached at any hour by calling [phone]. Do not use MyChart messaging for urgent concerns.    DIET:  We do recommend a small meal at first, but then you may proceed to your regular diet.  Drink plenty of fluids but you should avoid alcoholic beverages for 24 hours.  ACTIVITY:  You should plan to take it easy for the rest of today and you should NOT DRIVE or use heavy machinery until tomorrow (because of the sedation medicines used during the test).    FOLLOW UP: Our staff will call the number listed on your records the next business day following your procedure.  We will call around 7:15- 8:00 am to check on you and address any questions or concerns that you may have regarding the information given to you following your procedure. If we do not reach you, we will leave a message.     If any biopsies were taken you will be contacted by phone or by letter within the next 1-3 weeks.  Please call us  at (  [phone] if you have not heard about the biopsies in 3 weeks.    SIGNATURES/CONFIDENTIALITY: You and/or your care partner have signed paperwork which will be entered into your electronic medical record.  These signatures attest to the fact that that the information above on your After Visit Summary has been reviewed and is understood.  Full responsibility of the confidentiality of this discharge information lies with you and/or your care-partner.

## 2024-10-22 NOTE — Progress Notes (Signed)
 Called to room to assist during endoscopic procedure.  Patient ID and intended procedure confirmed with present staff. Received instructions for my participation in the procedure from the performing physician.

## 2024-10-23 ENCOUNTER — Telehealth: Payer: Self-pay | Admitting: *Deleted

## 2024-10-23 NOTE — Telephone Encounter (Signed)
  Follow up Call-     10/22/2024    7:16 AM  Call back number  Post procedure Call Back phone  # [phone]  Permission to leave phone message Yes    Left message to call back if any questions or concerns

## 2024-10-26 ENCOUNTER — Ambulatory Visit
Admission: RE | Admit: 2024-10-26 | Discharge: 2024-10-26 | Disposition: A | Discharge status: Home / Self Care | Source: Ambulatory Visit | Attending: Family Medicine | Admitting: Family Medicine

## 2024-10-26 DIAGNOSIS — Z0000 Encounter for general adult medical examination without abnormal findings: Secondary | ICD-10-CM

## 2024-10-26 LAB — SURGICAL PATHOLOGY

## 2024-11-02 ENCOUNTER — Ambulatory Visit: Payer: Self-pay | Admitting: Internal Medicine
